# Patient Record
Sex: Male | Born: 1955
Health system: Southern US, Community
[De-identification: ages and names within clinical notes are randomized; demographics above are authoritative.]

## PROBLEM LIST (undated history)

## (undated) DIAGNOSIS — I8390 Asymptomatic varicose veins of unspecified lower extremity: Secondary | ICD-10-CM

## (undated) DIAGNOSIS — I10 Essential (primary) hypertension: Secondary | ICD-10-CM

## (undated) DIAGNOSIS — E785 Hyperlipidemia, unspecified: Secondary | ICD-10-CM

## (undated) DIAGNOSIS — K219 Gastro-esophageal reflux disease without esophagitis: Secondary | ICD-10-CM

## (undated) HISTORY — DX: Hyperlipidemia, unspecified: E78.5

## (undated) HISTORY — PX: MANDIBLE SURGERY: SHX707

## (undated) HISTORY — PX: HERNIA REPAIR: SHX51

---

## 1995-12-22 HISTORY — PX: ESOPHAGOGASTRIC FUNDOPLICATION: SHX405

## 2007-11-25 ENCOUNTER — Emergency Department (HOSPITAL_COMMUNITY): Admission: EM | Admit: 2007-11-25 | Discharge: 2007-11-25 | Payer: Self-pay | Admitting: Family Medicine

## 2008-12-21 HISTORY — PX: COLONOSCOPY: SHX174

## 2009-01-30 ENCOUNTER — Emergency Department (HOSPITAL_COMMUNITY): Admission: EM | Admit: 2009-01-30 | Discharge: 2009-01-30 | Payer: Self-pay | Admitting: Family Medicine

## 2009-09-25 ENCOUNTER — Ambulatory Visit: Payer: Self-pay | Admitting: Internal Medicine

## 2009-10-10 ENCOUNTER — Ambulatory Visit: Payer: Self-pay | Admitting: Internal Medicine

## 2010-10-24 ENCOUNTER — Emergency Department (HOSPITAL_COMMUNITY): Admission: EM | Admit: 2010-10-24 | Discharge: 2010-10-24 | Payer: Self-pay | Admitting: Emergency Medicine

## 2011-02-11 ENCOUNTER — Other Ambulatory Visit (HOSPITAL_COMMUNITY): Payer: Self-pay | Admitting: Specialist

## 2011-02-15 ENCOUNTER — Other Ambulatory Visit (HOSPITAL_COMMUNITY): Payer: Self-pay

## 2011-02-16 ENCOUNTER — Other Ambulatory Visit (HOSPITAL_COMMUNITY): Payer: Self-pay | Admitting: Specialist

## 2011-02-17 ENCOUNTER — Ambulatory Visit (HOSPITAL_COMMUNITY)
Admission: RE | Admit: 2011-02-17 | Discharge: 2011-02-17 | Disposition: A | Discharge status: Home / Self Care | Payer: 59 | Source: Ambulatory Visit | Attending: Specialist | Admitting: Specialist

## 2011-02-17 DIAGNOSIS — M79609 Pain in unspecified limb: Secondary | ICD-10-CM | POA: Insufficient documentation

## 2011-02-17 MED ORDER — GADOBENATE DIMEGLUMINE 529 MG/ML IV SOLN
20.0000 mL | Freq: Once | INTRAVENOUS | Status: AC | PRN
  Administered 2011-02-17: 20 mL via INTRAVENOUS

## 2011-03-03 ENCOUNTER — Ambulatory Visit: Payer: 59 | Attending: Specialist | Admitting: Physical Therapy

## 2011-03-03 DIAGNOSIS — M25529 Pain in unspecified elbow: Secondary | ICD-10-CM | POA: Insufficient documentation

## 2011-03-03 DIAGNOSIS — IMO0001 Reserved for inherently not codable concepts without codable children: Secondary | ICD-10-CM | POA: Insufficient documentation

## 2011-03-09 ENCOUNTER — Ambulatory Visit: Payer: 59 | Admitting: Physical Therapy

## 2011-03-11 ENCOUNTER — Ambulatory Visit: Payer: 59 | Admitting: Physical Therapy

## 2011-03-16 ENCOUNTER — Ambulatory Visit: Payer: 59 | Admitting: Physical Therapy

## 2011-03-18 ENCOUNTER — Ambulatory Visit: Payer: 59 | Admitting: Physical Therapy

## 2011-03-26 ENCOUNTER — Ambulatory Visit: Payer: 59 | Attending: Specialist | Admitting: Physical Therapy

## 2011-03-26 DIAGNOSIS — M25529 Pain in unspecified elbow: Secondary | ICD-10-CM | POA: Insufficient documentation

## 2011-03-26 DIAGNOSIS — IMO0001 Reserved for inherently not codable concepts without codable children: Secondary | ICD-10-CM | POA: Insufficient documentation

## 2011-03-31 ENCOUNTER — Encounter: Payer: Commercial Managed Care - PPO | Admitting: Physical Therapy

## 2011-04-02 ENCOUNTER — Ambulatory Visit: Payer: 59 | Admitting: Physical Therapy

## 2011-04-03 ENCOUNTER — Ambulatory Visit: Payer: 59 | Admitting: Physical Therapy

## 2011-04-20 ENCOUNTER — Ambulatory Visit: Payer: 59 | Admitting: Physical Therapy

## 2011-04-22 ENCOUNTER — Ambulatory Visit: Payer: 59 | Attending: Specialist | Admitting: Physical Therapy

## 2011-04-22 DIAGNOSIS — IMO0001 Reserved for inherently not codable concepts without codable children: Secondary | ICD-10-CM | POA: Insufficient documentation

## 2011-04-22 DIAGNOSIS — M25529 Pain in unspecified elbow: Secondary | ICD-10-CM | POA: Insufficient documentation

## 2011-04-29 ENCOUNTER — Encounter: Payer: Commercial Managed Care - PPO | Admitting: Physical Therapy

## 2011-04-30 ENCOUNTER — Encounter: Payer: Commercial Managed Care - PPO | Admitting: Physical Therapy

## 2011-10-21 ENCOUNTER — Inpatient Hospital Stay (INDEPENDENT_AMBULATORY_CARE_PROVIDER_SITE_OTHER)
Admission: RE | Admit: 2011-10-21 | Discharge: 2011-10-21 | Disposition: A | Discharge status: Home / Self Care | Payer: 59 | Source: Ambulatory Visit | Attending: Family Medicine | Admitting: Family Medicine

## 2011-10-21 DIAGNOSIS — J40 Bronchitis, not specified as acute or chronic: Secondary | ICD-10-CM

## 2011-11-08 ENCOUNTER — Emergency Department
Admission: EM | Admit: 2011-11-08 | Discharge: 2011-11-08 | Disposition: A | Discharge status: Home / Self Care | Payer: 59 | Source: Home / Self Care | Attending: Emergency Medicine | Admitting: Emergency Medicine

## 2011-11-08 DIAGNOSIS — J069 Acute upper respiratory infection, unspecified: Secondary | ICD-10-CM

## 2011-11-08 DIAGNOSIS — H6690 Otitis media, unspecified, unspecified ear: Secondary | ICD-10-CM

## 2011-11-08 DIAGNOSIS — H6692 Otitis media, unspecified, left ear: Secondary | ICD-10-CM

## 2011-11-08 DIAGNOSIS — R059 Cough, unspecified: Secondary | ICD-10-CM

## 2011-11-08 DIAGNOSIS — R05 Cough: Secondary | ICD-10-CM

## 2011-11-08 MED ORDER — PREDNISONE 10 MG PO TABS: 10.0000 mg | ORAL_TABLET | Freq: Two times a day (BID) | ORAL | Status: AC

## 2011-11-08 MED ORDER — AMOXICILLIN-POT CLAVULANATE 875-125 MG PO TABS: 1.0000 | ORAL_TABLET | Freq: Two times a day (BID) | ORAL | Status: AC

## 2011-11-08 NOTE — ED Provider Notes (Signed)
History     CSN: [id number] Arrival date & time: No admission date for patient encounter.   First MD Initiated Contact with Patient 11/08/11 1455      No chief complaint on file.   (Consider location/radiation/quality/duration/timing/severity/associated sxs/prior treatment) HPI Callen is a 55 y.o. male who complains of onset of cold symptoms for a few weeks.  He was seen at the [facility] urgent care about 3 weeks ago and was diagnosed with bronchitis, where he received a Z-Pak, cough medicine, and prednisone.  With this medicine he felt like it a lot better.  However, he is still have a lingering cough and some congestion.  It is worse when he walks out in the cold air.  No h/o asthma. No sore throat + cough No pleuritic pain No wheezing + nasal congestion + post-nasal drainage No sinus pain/pressure No chest congestion No itchy/red eyes No earache No hemoptysis No SOB No chills/sweats No fever No nausea No vomiting No abdominal pain No diarrhea No skin rashes No fatigue No myalgias No headache     No past medical history on file.  No past surgical history on file.  No family history on file.  History  Substance Use Topics  . Smoking status: Not on file  . Smokeless tobacco: Not on file  . Alcohol Use: Not on file      Review of Systems  Allergies  Review of patient's allergies indicates not on file.  Home Medications  No current outpatient prescriptions on file.  There were no vitals taken for this visit.  Physical Exam  Nursing note and vitals reviewed. Constitutional: He is oriented to person, place, and time. He appears well-developed and well-nourished.  HENT:  Head: Normocephalic and atraumatic.  Right Ear: Tympanic membrane, external ear and ear canal normal.  Left Ear: External ear and ear canal normal. Tympanic membrane is injected and erythematous.  Nose: Mucosal edema and rhinorrhea present.  Mouth/Throat: Posterior  oropharyngeal erythema present. No oropharyngeal exudate or posterior oropharyngeal edema.  Neck: Neck supple.  Cardiovascular: Regular rhythm and normal heart sounds.   Pulmonary/Chest: Effort normal and breath sounds normal. No respiratory distress.  Neurological: He is alert and oriented to person, place, and time.  Skin: Skin is warm and dry.  Psychiatric: He has a normal mood and affect. His speech is normal.    ED Course  Procedures (including critical care time)  Labs Reviewed - No data to display No results found.   No diagnosis found.    MDM   1)  Take the prescribed antibiotic as instructed. 2)  Use nasal saline solution (over the counter) at least 3 times a day. 3)  Use over the counter decongestants like Zyrtec-D every 12 hours as needed to help with congestion.  If you have hypertension, do not take medicines with sudafed.  4)  Can take tylenol every 6 hours or motrin every 8 hours for pain or fever. 5)  Follow up with your primary doctor if no improvement in 5-7 days, sooner if increasing pain, fever, or new symptoms.       Lily Kocher, MD 11/08/11 1538

## 2012-11-20 ENCOUNTER — Encounter (HOSPITAL_COMMUNITY): Payer: Self-pay | Admitting: *Deleted

## 2012-11-20 ENCOUNTER — Emergency Department (HOSPITAL_COMMUNITY)
Admission: EM | Admit: 2012-11-20 | Discharge: 2012-11-20 | Disposition: A | Discharge status: Home / Self Care | Payer: 59 | Source: Home / Self Care | Attending: Emergency Medicine | Admitting: Emergency Medicine

## 2012-11-20 DIAGNOSIS — I83009 Varicose veins of unspecified lower extremity with ulcer of unspecified site: Secondary | ICD-10-CM

## 2012-11-20 DIAGNOSIS — L97909 Non-pressure chronic ulcer of unspecified part of unspecified lower leg with unspecified severity: Secondary | ICD-10-CM

## 2012-11-20 HISTORY — DX: Gastro-esophageal reflux disease without esophagitis: K21.9

## 2012-11-20 HISTORY — DX: Asymptomatic varicose veins of unspecified lower extremity: I83.90

## 2012-11-20 MED ORDER — WHITE PETROLATUM EX GEL: CUTANEOUS | Status: DC | PRN

## 2012-11-20 MED ORDER — TRIAMCINOLONE ACETONIDE 0.1 % EX CREA: TOPICAL_CREAM | Freq: Two times a day (BID) | CUTANEOUS | Status: DC

## 2012-11-20 NOTE — ED Provider Notes (Signed)
History     CSN: [id number]  Arrival date & time 11/20/12  6644   First MD Initiated Contact with Patient 11/20/12 1047      Chief Complaint  Patient presents with  . Sore    (Consider location/radiation/quality/duration/timing/severity/associated sxs/prior treatment) HPI Comments: Patient presents urgent care describing a long history of varicose veins and a new lead develop ulcerated area to the inner aspect of his right ankle for about 3 weeks. Patient has been applying Gold Bond and cortisone to the area but is not helping. He wears daily tennis shoes. Patient denies any recent injury, fevers or chills.  The history is provided by the patient.    Past Medical History  Diagnosis Date  . GERD (gastroesophageal reflux disease)   . Varicose veins     History reviewed. No pertinent past surgical history.  Family History  Problem Relation Age of Onset  . Diabetes Mother   . Diabetes Father     History  Substance Use Topics  . Smoking status: Never Smoker   . Smokeless tobacco: Not on file  . Alcohol Use: No      Review of Systems  Constitutional: Negative for chills, activity change and appetite change.  Skin: Positive for color change and wound. Negative for rash.    Allergies  Review of patient's allergies indicates no known allergies.  Home Medications   Current Outpatient Rx  Name  Route  Sig  Dispense  Refill  . ESOMEPRAZOLE MAGNESIUM 20 MG PO CPDR   Oral   Take 20 mg by mouth daily before breakfast.           . OMEGA-3 FATTY ACIDS 1000 MG PO CAPS   Oral   Take 2 g by mouth daily.           . TRIAMCINOLONE ACETONIDE 0.1 % EX CREA   Topical   Apply topically 2 (two) times daily.   30 g   0   . WHITE PETROLATUM EX GEL   Topical   Apply topically as needed for dry skin.   30 g   0     BP 140/89  Pulse 95  Temp 98.5 F (36.9 C) (Oral)  Resp 18  SpO2 100%  Physical Exam  Nursing note and vitals reviewed. Constitutional: Vital  signs are normal. He appears well-developed and well-nourished. He appears toxic.  Musculoskeletal: He exhibits tenderness.       Feet:  Neurological: He is alert.  Skin: Rash noted. There is erythema.    ED Course  Procedures (including critical care time)  Labs Reviewed - No data to display No results found.   1. Venous stasis ulcer       MDM  Chronic changes- with peripheral varices and flank bases. Patient with a stage I ulcerative/ excoriation of in her aspect of right ankle. Most likely secondary to chronic venostasis early stages. Patient was prescribed a steroid cream along with petrolatum ointment and occlusive dressings. Instructed and encouraged to followup with the [facility]. We also discussed proper shoe wear to prevent friction and rubbing.        Jimmie Molly, MD 11/20/12 [phone]

## 2012-11-20 NOTE — ED Notes (Signed)
Pt  Has  History  Of  Varicose  Veins  He  Has  An  Ulcerated  Area   To  r lower  Leg      X  3  Weeks   - he  Reports  He  Has  Had        Problems  With the  r  Leg   For  Years     The  r lower  Leg  Is   Swollen  Somewhat      He  denys  Any  specefic  Injury  He  Is   Alert  And  Pleasant     He  Has  Been  Applying   Gold  Bond  And   Cortisone

## 2013-02-17 ENCOUNTER — Encounter (HOSPITAL_BASED_OUTPATIENT_CLINIC_OR_DEPARTMENT_OTHER): Payer: 59 | Attending: General Surgery

## 2013-02-17 DIAGNOSIS — L97809 Non-pressure chronic ulcer of other part of unspecified lower leg with unspecified severity: Secondary | ICD-10-CM | POA: Insufficient documentation

## 2013-02-17 DIAGNOSIS — K219 Gastro-esophageal reflux disease without esophagitis: Secondary | ICD-10-CM | POA: Insufficient documentation

## 2013-02-17 DIAGNOSIS — I872 Venous insufficiency (chronic) (peripheral): Secondary | ICD-10-CM | POA: Insufficient documentation

## 2013-02-18 NOTE — Progress Notes (Signed)
[facility]  NAME:  CARDIN, NITSCHKE NO.:  [id number]  MEDICAL RECORD NO.:  [id number]      DATE OF BIRTH:  07-Feb-1956  PHYSICIAN:  Ardath Sax, M.D.           VISIT DATE:                                  OFFICE VISIT   This is a 57 year old man, who is an employee in the engineering department of [employer].  He has a long history of venous hypertension and he has had a recurrent ulcer on the medial aspect of his right lower leg.  He has had Unna boots use on this before which have helped.  Today, I looked at the wound and he has typical venous changes of chronic venostasis and the ulcer is about 2 x 2 cm in diameter.  I debrided it with a curette and then we put on Santyl and covered it with silver alginate and put him in an [employer].  His past history really shows him to be a healthy man.  He does not have any history of anything but varicose veins and gastroesophageal reflux.  He only takes vitamins as his only medicine.  We found him to have a blood pressure of 140/82, pulse of 68, temperature 98, and respirations 18. We will treat him with the Unna boots and the silver alginate and Santyl and see him back in a week.  I suspect that this will probably take about a month to heal with the Unna boots.     Ardath Sax, M.D.     PP/MEDQ  D:  02/17/2013  T:  02/18/2013  Job:  [id number]

## 2013-02-24 ENCOUNTER — Encounter (HOSPITAL_BASED_OUTPATIENT_CLINIC_OR_DEPARTMENT_OTHER): Payer: 59 | Attending: General Surgery

## 2013-02-24 DIAGNOSIS — L97909 Non-pressure chronic ulcer of unspecified part of unspecified lower leg with unspecified severity: Secondary | ICD-10-CM | POA: Insufficient documentation

## 2013-02-24 DIAGNOSIS — I87319 Chronic venous hypertension (idiopathic) with ulcer of unspecified lower extremity: Secondary | ICD-10-CM | POA: Insufficient documentation

## 2013-03-13 ENCOUNTER — Ambulatory Visit (INDEPENDENT_AMBULATORY_CARE_PROVIDER_SITE_OTHER): Payer: 59 | Admitting: Family Medicine

## 2013-03-13 ENCOUNTER — Encounter: Payer: Self-pay | Admitting: Family Medicine

## 2013-03-13 VITALS — BP 126/81 | HR 66 | Temp 98.1°F | Ht 72.0 in | Wt 237.0 lb

## 2013-03-13 DIAGNOSIS — I83019 Varicose veins of right lower extremity with ulcer of unspecified site: Secondary | ICD-10-CM

## 2013-03-13 DIAGNOSIS — L97909 Non-pressure chronic ulcer of unspecified part of unspecified lower leg with unspecified severity: Secondary | ICD-10-CM | POA: Insufficient documentation

## 2013-03-13 DIAGNOSIS — E785 Hyperlipidemia, unspecified: Secondary | ICD-10-CM

## 2013-03-13 DIAGNOSIS — K219 Gastro-esophageal reflux disease without esophagitis: Secondary | ICD-10-CM

## 2013-03-13 DIAGNOSIS — Z833 Family history of diabetes mellitus: Secondary | ICD-10-CM

## 2013-03-13 DIAGNOSIS — I83009 Varicose veins of unspecified lower extremity with ulcer of unspecified site: Secondary | ICD-10-CM

## 2013-03-13 LAB — COMPREHENSIVE METABOLIC PANEL
ALT: 41 U/L (ref 0–53)
AST: 31 U/L (ref 0–37)
Albumin: 4.4 g/dL (ref 3.5–5.2)
Alkaline Phosphatase: 80 U/L (ref 39–117)
Glucose, Bld: 84 mg/dL (ref 70–99)
Potassium: 4.4 mEq/L (ref 3.5–5.3)
Sodium: 142 mEq/L (ref 135–145)
Total Protein: 7.2 g/dL (ref 6.0–8.3)

## 2013-03-13 LAB — CBC
MCH: 28.7 pg (ref 26.0–34.0)
MCHC: 34 g/dL (ref 30.0–36.0)
Platelets: 177 10*3/uL (ref 150–400)
RDW: 14 % (ref 11.5–15.5)

## 2013-03-13 LAB — LIPID PANEL: Cholesterol: 123 mg/dL (ref 0–200)

## 2013-03-13 NOTE — Progress Notes (Signed)
Subjective:    [name] [name] is a 57 y.o. male who presents to [facility] today for a new patient visit:  [name] [name] is a very pleasant 57 year old male who works here at [employer] hospital in [employer] and who presents 2 care to establish a new primary care relationship. He only has past medical history significant for GERD for which she is status post fundoplication in the late 1990s, some shoulder pain which resolved after he received an MRI and found nothing wrong, and consistently low HDL found on laboratory results. Patient states that he elected to show to lower his triglycerides and increase his HDL.  He was previously being seen by a practice in [facility] which is having him come in every 2 months to check a host of labs including C. meds, HIV, RPR, PSA, fasting lipid profile. He also had an EKG performed at each of these visits and these have all been within normal limits.  He is a never smoker. He is married to his wife of over 25 years. They have one son who is currently on a scholarship for dancing at the [employer].  His one chronic issue is ongoing right chronic wound ulcer secondary to venous stasis changes secondary to varicose vein ligation several years ago. He has no history of DVT and has never been on any blood thinners. He is seen at wound care for his wound ulcer.  The patient denies fever, unusual weight change, decreased hearing, chest pain, palpitations, pre-syncopal or syncopal episodes, dyspnea on exertion, prolonged cough, hemoptysis, change in bowel habits, melena, hematochezia, severe indigestion/heartburn, nausea/vomiting/abdominal pain, genital sores, muscle weakness, difficulty walking, abnormal bleeding, or enlarged lymph nodes.     The following portions of the patient's history were reviewed and updated as appropriate: allergies, current medications, past medical history, family and social history, and problem list. Patient is a nonsmoker.    PMH  reviewed.  Past Medical History  Diagnosis Date  . GERD (gastroesophageal reflux disease)   . Varicose veins    Past Surgical History  Procedure Laterality Date  . Esophagogastric fundoplication  1997    Medications reviewed. Current Outpatient Prescriptions  Medication Sig Dispense Refill  . esomeprazole (NEXIUM) 20 MG capsule Take 20 mg by mouth daily before breakfast.        . fish oil-omega-3 fatty acids 1000 MG capsule Take 2 g by mouth daily.        Marland Kitchen triamcinolone cream (KENALOG) 0.1 % Apply topically 2 (two) times daily.  30 g  0  . white petrolatum ointment Apply topically as needed for dry skin.  30 g  0   No current facility-administered medications for this visit.      Objective:   Physical Exam BP 126/81  Pulse 66  Temp(Src) 98.1 F (36.7 C) (Oral)  Ht 6' (1.829 m)  Wt 237 lb (107.502 kg)  BMI 32.14 kg/m2 Gen:  Alert, cooperative patient who appears stated age in no acute distress.  Vital signs reviewed.  Obese, pleasant, conversant HEENT:  [place]/AT.  EOMI, PERRL.  MMM, tonsils non-erythematous, non-edematous.  External ears WNL, Bilateral TM's normal without retraction, redness or bulging.  Neck:  Supple, no LAD Cardiac:  Regular rate and rhythm without murmur auscultated.  Good S1/S2. Pulm:  Clear to auscultation bilaterally with good air movement.  No wheezes or rales noted.   Abd:  Soft/nondistended/nontender.  Good bowel sounds throughout all four quadrants.  No masses noted.  Exts: Non edematous BL.  Left LE WNL.  Right LE with Unna boot in place.  Neuro:  Alert and oriented to person, place, and date.  CN II-XII intact.  No focal deficits noted.   Psych:  Not depressed or anxious appearing. Linear and coherent thought process.  No results found for this or any previous visit (from the past 72 hour(s)).

## 2013-03-13 NOTE — Assessment & Plan Note (Signed)
Continue PPI.   Received letter from insurance saying that he would need to change his Nexium to another PPI and they presented a list. He does not have this with him today. He is to call back when his refill is about to run out also that I can call in the new prescription for him

## 2013-03-13 NOTE — Patient Instructions (Addendum)
We'll check some labs today.  Remember to sign up for Mychart if you get the chance.  It was good to meet you today!  Health Maintenance, Males A healthy lifestyle and preventative care can promote health and wellness.  Maintain regular health, dental, and eye exams.  Eat a healthy diet. Foods like vegetables, fruits, whole grains, low-fat dairy products, and lean protein foods contain the nutrients you need without too many calories. Decrease your intake of foods high in solid fats, added sugars, and salt. Get information about a proper diet from your caregiver, if necessary.  Regular physical exercise is one of the most important things you can do for your health. Most adults should get at least 150 minutes of moderate-intensity exercise (any activity that increases your heart rate and causes you to sweat) each week. In addition, most adults need muscle-strengthening exercises on 2 or more days a week.   Maintain a healthy weight. The body mass index (BMI) is a screening tool to identify possible weight problems. It provides an estimate of body fat based on height and weight. Your caregiver can help determine your BMI, and can help you achieve or maintain a healthy weight. For adults 20 years and older:  A BMI below 18.5 is considered underweight.  A BMI of 18.5 to 24.9 is normal.  A BMI of 25 to 29.9 is considered overweight.  A BMI of 30 and above is considered obese.  Maintain normal blood lipids and cholesterol by exercising and minimizing your intake of saturated fat. Eat a balanced diet with plenty of fruits and vegetables. Blood tests for lipids and cholesterol should begin at age 25 and be repeated every 5 years. If your lipid or cholesterol levels are high, you are over 50, or you are a high risk for heart disease, you may need your cholesterol levels checked more frequently.Ongoing high lipid and cholesterol levels should be treated with medicines, if diet and exercise are not  effective.  If you smoke, find out from your caregiver how to quit. If you do not use tobacco, do not start.  If you choose to drink alcohol, do not exceed 2 drinks per day. One drink is considered to be 12 ounces (355 mL) of beer, 5 ounces (148 mL) of wine, or 1.5 ounces (44 mL) of liquor.  Avoid use of street drugs. Do not share needles with anyone. Ask for help if you need support or instructions about stopping the use of drugs.  High blood pressure causes heart disease and increases the risk of stroke. Blood pressure should be checked at least every 1 to 2 years. Ongoing high blood pressure should be treated with medicines if weight loss and exercise are not effective.  If you are 54 to 57 years old, ask your caregiver if you should take aspirin to prevent heart disease.  Diabetes screening involves taking a blood sample to check your fasting blood sugar level. This should be done once every 3 years, after age 68, if you are within normal weight and without risk factors for diabetes. Testing should be considered at a younger age or be carried out more frequently if you are overweight and have at least 1 risk factor for diabetes.  Colorectal cancer can be detected and often prevented. Most routine colorectal cancer screening begins at the age of 11 and continues through age 13. However, your caregiver may recommend screening at an earlier age if you have risk factors for colon cancer. On a yearly basis,  your caregiver may provide home test kits to check for hidden blood in the stool. Use of a small camera at the end of a tube, to directly examine the colon (sigmoidoscopy or colonoscopy), can detect the earliest forms of colorectal cancer. Talk to your caregiver about this at age 84, when routine screening begins. Direct examination of the colon should be repeated every 5 to 10 years through age 68, unless early forms of pre-cancerous polyps or small growths are found.  Hepatitis C blood testing is  recommended for all people born from [birth year] through 1965 and any individual with known risks for hepatitis C.  Healthy men should no longer receive prostate-specific antigen (PSA) blood tests as part of routine cancer screening. Consult with your caregiver about prostate cancer screening.  Testicular cancer screening is not recommended for adolescents or adult males who have no symptoms. Screening includes self-exam, caregiver exam, and other screening tests. Consult with your caregiver about any symptoms you have or any concerns you have about testicular cancer.  Practice safe sex. Use condoms and avoid high-risk sexual practices to reduce the spread of sexually transmitted infections (STIs).  Use sunscreen with a sun protection factor (SPF) of 30 or greater. Apply sunscreen liberally and repeatedly throughout the day. You should seek shade when your shadow is shorter than you. Protect yourself by wearing long sleeves, pants, a wide-brimmed hat, and sunglasses year round, whenever you are outdoors.  Notify your caregiver of new moles or changes in moles, especially if there is a change in shape or color. Also notify your caregiver if a mole is larger than the size of a pencil eraser.  A one-time screening for abdominal aortic aneurysm (AAA) and surgical repair of large AAAs by sound wave imaging (ultrasonography) is recommended for ages 78 to 72 years who are current or former smokers.  Stay current with your immunizations. Document Released: 06/04/2008 Document Revised: 02/29/2012 Document Reviewed: 05/04/2011 [facility] Patient Information 2013 [place], [place].

## 2013-03-13 NOTE — Assessment & Plan Note (Signed)
Followed by wound care with weekly Unna boot changes. This is healing nicely.

## 2013-03-13 NOTE — Assessment & Plan Note (Signed)
Checking fasting lipids today.

## 2013-03-15 ENCOUNTER — Encounter: Payer: Self-pay | Admitting: Family Medicine

## 2013-03-24 ENCOUNTER — Encounter (HOSPITAL_BASED_OUTPATIENT_CLINIC_OR_DEPARTMENT_OTHER): Payer: 59 | Attending: General Surgery

## 2013-03-24 DIAGNOSIS — I872 Venous insufficiency (chronic) (peripheral): Secondary | ICD-10-CM | POA: Insufficient documentation

## 2013-03-24 DIAGNOSIS — L97409 Non-pressure chronic ulcer of unspecified heel and midfoot with unspecified severity: Secondary | ICD-10-CM | POA: Insufficient documentation

## 2013-03-24 DIAGNOSIS — I87309 Chronic venous hypertension (idiopathic) without complications of unspecified lower extremity: Secondary | ICD-10-CM | POA: Insufficient documentation

## 2013-06-20 ENCOUNTER — Ambulatory Visit (INDEPENDENT_AMBULATORY_CARE_PROVIDER_SITE_OTHER): Payer: 59 | Admitting: Family Medicine

## 2013-06-20 VITALS — BP 129/89 | HR 85 | Temp 97.9°F | Resp 20 | Wt 238.0 lb

## 2013-06-20 DIAGNOSIS — J019 Acute sinusitis, unspecified: Secondary | ICD-10-CM

## 2013-06-20 DIAGNOSIS — K219 Gastro-esophageal reflux disease without esophagitis: Secondary | ICD-10-CM

## 2013-06-20 MED ORDER — AMOXICILLIN-POT CLAVULANATE 875-125 MG PO TABS: 1.0000 | ORAL_TABLET | Freq: Two times a day (BID) | ORAL | Status: DC

## 2013-06-20 MED ORDER — PANTOPRAZOLE SODIUM 40 MG PO TBEC: 40.0000 mg | DELAYED_RELEASE_TABLET | Freq: Every day | ORAL | Status: DC

## 2013-06-20 NOTE — Progress Notes (Signed)
Subjective:     Patient ID: [name] [name], male   DOB: Dec 13, 1956, 57 y.o.   MRN: [id number]  HPI 57 year old male presents for sinus pressure/congestion. Patient states that he has cold symptoms for 1 month.  It all began with persistent cough.  He then developed nasal congestion and purulent nasal discharge.  He has been using OTC mucinex to aid in symptom relief.  He denies recent fevers/chills but has had persistent symptoms.  No other associated symptoms: no sore throat, nausea/vomiting, SOB, chest discomfort.   Review of Systems Per HPI    Objective:   Physical Exam Filed Vitals:   06/20/13 1412  BP: 129/89  Pulse: 85  Temp: 97.9 F (36.6 C)  Resp: 20   Exam: General: well appearing, NAD. HEENT: NCAT. No pharyngeal erythema or exudate.  slighty erythematous nasal mucosa. Cardiovascular: RRR. No murmurs, rubs, or gallops. Respiratory: CTAB. No rales, rhonchi, or wheeze.    Assessment:     See prob. List.    Plan:

## 2013-06-20 NOTE — Assessment & Plan Note (Signed)
Given duration of symptoms will treat with empiric Augmentin x 7 days.

## 2013-06-20 NOTE — Assessment & Plan Note (Signed)
Patient reports that he would like to switch to Protonix (formulary/insurance issues).  Will send in to pharmacy.

## 2013-06-20 NOTE — Patient Instructions (Addendum)
It was nice seeing you today.  Please take the antibiotic as prescribed.  If you have any questions or concerns don't hesitate to call.  Sinusitis Sinusitis is redness, soreness, and swelling (inflammation) of the paranasal sinuses. Paranasal sinuses are air pockets within the bones of your face (beneath the eyes, the middle of the forehead, or above the eyes). In healthy paranasal sinuses, mucus is able to drain out, and air is able to circulate through them by way of your nose. However, when your paranasal sinuses are inflamed, mucus and air can become trapped. This can allow bacteria and other germs to grow and cause infection. Sinusitis can develop quickly and last only a short time (acute) or continue over a long period (chronic). Sinusitis that lasts for more than 12 weeks is considered chronic.  CAUSES  Causes of sinusitis include:  Allergies.  Structural abnormalities, such as displacement of the cartilage that separates your nostrils (deviated septum), which can decrease the air flow through your nose and sinuses and affect sinus drainage.  Functional abnormalities, such as when the small hairs (cilia) that line your sinuses and help remove mucus do not work properly or are not present. SYMPTOMS  Symptoms of acute and chronic sinusitis are the same. The primary symptoms are pain and pressure around the affected sinuses. Other symptoms include:  Upper toothache.  Earache.  Headache.  Bad breath.  Decreased sense of smell and taste.  A cough, which worsens when you are lying flat.  Fatigue.  Fever.  Thick drainage from your nose, which often is green and may contain pus (purulent).  Swelling and warmth over the affected sinuses. DIAGNOSIS  Your caregiver will perform a physical exam. During the exam, your caregiver may:  Look in your nose for signs of abnormal growths in your nostrils (nasal polyps).  Tap over the affected sinus to check for signs of  infection.  View the inside of your sinuses (endoscopy) with a special imaging device with a light attached (endoscope), which is inserted into your sinuses. If your caregiver suspects that you have chronic sinusitis, one or more of the following tests may be recommended:  Allergy tests.  Nasal culture A sample of mucus is taken from your nose and sent to a lab and screened for bacteria.  Nasal cytology A sample of mucus is taken from your nose and examined by your caregiver to determine if your sinusitis is related to an allergy. TREATMENT  Most cases of acute sinusitis are related to a viral infection and will resolve on their own within 10 days. Sometimes medicines are prescribed to help relieve symptoms (pain medicine, decongestants, nasal steroid sprays, or saline sprays).  However, for sinusitis related to a bacterial infection, your caregiver will prescribe antibiotic medicines. These are medicines that will help kill the bacteria causing the infection.  Rarely, sinusitis is caused by a fungal infection. In theses cases, your caregiver will prescribe antifungal medicine. For some cases of chronic sinusitis, surgery is needed. Generally, these are cases in which sinusitis recurs more than 3 times per year, despite other treatments. HOME CARE INSTRUCTIONS   Drink plenty of water. Water helps thin the mucus so your sinuses can drain more easily.  Use a humidifier.  Inhale steam 3 to 4 times a day (for example, sit in the bathroom with the shower running).  Apply a warm, moist washcloth to your face 3 to 4 times a day, or as directed by your caregiver.  Use saline nasal sprays to  help moisten and clean your sinuses.  Take over-the-counter or prescription medicines for pain, discomfort, or fever only as directed by your caregiver. SEEK IMMEDIATE MEDICAL CARE IF:  You have increasing pain or severe headaches.  You have nausea, vomiting, or drowsiness.  You have swelling around your  face.  You have vision problems.  You have a stiff neck.  You have difficulty breathing. MAKE SURE YOU:   Understand these instructions.  Will watch your condition.  Will get help right away if you are not doing well or get worse. Document Released: 12/07/2005 Document Revised: 02/29/2012 Document Reviewed: 12/22/2011 [facility] Patient Information 2014 [place], [place].

## 2013-06-20 NOTE — Progress Notes (Signed)
Pt reports possible sinus infection for greater than 2 weeks with no relief from over the counter meds. Wyatt Haste, RN-BSN

## 2013-09-07 ENCOUNTER — Telehealth: Payer: Self-pay | Admitting: Family Medicine

## 2013-09-07 NOTE — Telephone Encounter (Signed)
We did receive them

## 2013-09-07 NOTE — Telephone Encounter (Signed)
Patient left message on medical records line wondering if you ever received his medical records from [place].  He said that they have said they sent them twice.

## 2013-09-08 NOTE — Telephone Encounter (Signed)
Spoke with patient and informed him of below 

## 2013-10-26 ENCOUNTER — Other Ambulatory Visit: Payer: Self-pay

## 2013-11-01 ENCOUNTER — Encounter: Payer: Self-pay | Admitting: Family Medicine

## 2013-11-01 ENCOUNTER — Ambulatory Visit (INDEPENDENT_AMBULATORY_CARE_PROVIDER_SITE_OTHER): Payer: 59 | Admitting: Family Medicine

## 2013-11-01 VITALS — BP 152/98 | HR 92 | Temp 98.1°F | Wt 234.0 lb

## 2013-11-01 DIAGNOSIS — E669 Obesity, unspecified: Secondary | ICD-10-CM

## 2013-11-01 DIAGNOSIS — R058 Other specified cough: Secondary | ICD-10-CM

## 2013-11-01 DIAGNOSIS — R059 Cough, unspecified: Secondary | ICD-10-CM

## 2013-11-01 DIAGNOSIS — R05 Cough: Secondary | ICD-10-CM

## 2013-11-01 NOTE — Assessment & Plan Note (Signed)
This could be complicating the picture by worsening GERD. Patient should be counseled to lose weight.

## 2013-11-01 NOTE — Assessment & Plan Note (Signed)
Assessment: given short duration and the lack of infectious symptoms, the most likely cause is reflux in the affected his medication wearing off in the afternoon in leads to worsening dry cough; differential also includes allergic rhinitis and undiagnosed asthma Plan:  - Increase Protonix to 40 mg twice a day for 2 weeks to see if it improves

## 2013-11-01 NOTE — Progress Notes (Signed)
  Subjective:    Patient ID: [name] [name], male    DOB: 06-24-1956, 57 y.o.   MRN: [id number]  HPI  57 year old man with obesity and a history of GERD who presents with a 2 week history of nonproductive cough. The cough is worse the evenings when he gets off work and at night. It occurs while awake and not exclusively while lying down. He denies fever or chills the nurses in the morning he does have some congestion. He denies shortness of breath and chest pain. He does not have a similar history of nonproductive cough. He is currently taking his Protonix CT of the for GERD.  Review of Systems See history of present illness    Objective:   Physical Exam BP 152/98  Pulse 92  Temp(Src) 98.1 F (36.7 C) (Oral)  Wt 234 lb (106.142 kg) Body mass index is 31.73 kg/(m^2).  Gen.: obese white male, nondistressed nontender appearing HEENT: cephalic atraumatic, tympanic membranes clear flexor bilaterally without effusions, no submental or submandibular lymphadenopathy, no frontal or maxillary sinus tenderness, oropharynx clear and moist no exudates Lungs: normal work of breathing, clear to auscultation bilaterally, no dullness to percussion       Assessment & Plan:

## 2013-11-01 NOTE — Patient Instructions (Signed)
[name] [name],   It was great to meet you. I think that you are having reflux that is causing this coughing in the evening. Therefore, try taking two tablets a day for two weeks and return for follow up at this time.   Sincerely,   Dr. Clinton Sawyer

## 2014-01-18 ENCOUNTER — Telehealth: Payer: Self-pay | Admitting: Family Medicine

## 2014-01-18 NOTE — Telephone Encounter (Signed)
Pt called and needs a refill on his Pantoprazole. He made an appointment for 2/17. jw

## 2014-01-19 MED ORDER — PANTOPRAZOLE SODIUM 40 MG PO TBEC: 40.0000 mg | DELAYED_RELEASE_TABLET | Freq: Every day | ORAL | Status: DC

## 2014-02-06 ENCOUNTER — Encounter: Payer: 59 | Admitting: Family Medicine

## 2014-02-13 ENCOUNTER — Encounter: Payer: 59 | Admitting: Family Medicine

## 2014-03-06 ENCOUNTER — Ambulatory Visit (INDEPENDENT_AMBULATORY_CARE_PROVIDER_SITE_OTHER): Payer: 59 | Admitting: Family Medicine

## 2014-03-06 ENCOUNTER — Encounter: Payer: Self-pay | Admitting: Family Medicine

## 2014-03-06 VITALS — BP 129/87 | HR 68 | Ht 72.0 in | Wt 233.0 lb

## 2014-03-06 DIAGNOSIS — Z23 Encounter for immunization: Secondary | ICD-10-CM

## 2014-03-06 DIAGNOSIS — K219 Gastro-esophageal reflux disease without esophagitis: Secondary | ICD-10-CM

## 2014-03-06 DIAGNOSIS — Z0000 Encounter for general adult medical examination without abnormal findings: Secondary | ICD-10-CM

## 2014-03-06 DIAGNOSIS — E785 Hyperlipidemia, unspecified: Secondary | ICD-10-CM

## 2014-03-06 DIAGNOSIS — E669 Obesity, unspecified: Secondary | ICD-10-CM

## 2014-03-06 LAB — COMPREHENSIVE METABOLIC PANEL
ALBUMIN: 4.5 g/dL (ref 3.5–5.2)
ALK PHOS: 79 U/L (ref 39–117)
ALT: 43 U/L (ref 0–53)
AST: 28 U/L (ref 0–37)
BUN: 20 mg/dL (ref 6–23)
CO2: 30 mEq/L (ref 19–32)
Calcium: 9.2 mg/dL (ref 8.4–10.5)
Chloride: 105 mEq/L (ref 96–112)
Creat: 0.85 mg/dL (ref 0.50–1.35)
Glucose, Bld: 90 mg/dL (ref 70–99)
POTASSIUM: 4.4 meq/L (ref 3.5–5.3)
SODIUM: 140 meq/L (ref 135–145)
Total Bilirubin: 1.1 mg/dL (ref 0.2–1.2)
Total Protein: 7.4 g/dL (ref 6.0–8.3)

## 2014-03-06 LAB — LIPID PANEL
Cholesterol: 126 mg/dL (ref 0–200)
HDL: 28 mg/dL — ABNORMAL LOW
LDL Cholesterol: 65 mg/dL (ref 0–99)
Total CHOL/HDL Ratio: 4.5 ratio
Triglycerides: 167 mg/dL — ABNORMAL HIGH
VLDL: 33 mg/dL (ref 0–40)

## 2014-03-06 LAB — CBC
HEMATOCRIT: 45.7 % (ref 39.0–52.0)
Hemoglobin: 15.4 g/dL (ref 13.0–17.0)
MCH: 28.5 pg (ref 26.0–34.0)
MCHC: 33.7 g/dL (ref 30.0–36.0)
MCV: 84.5 fL (ref 78.0–100.0)
PLATELETS: 186 10*3/uL (ref 150–400)
RBC: 5.41 MIL/uL (ref 4.22–5.81)
RDW: 15.2 % (ref 11.5–15.5)
WBC: 5.7 10*3/uL (ref 4.0–10.5)

## 2014-03-06 NOTE — Patient Instructions (Addendum)
Think about Sept/October coming back for the lung testing.  We do that here.  For the skin tags, we can do that here or at a dermatologist.  Schedule a follow up if you want them frozen here.    Preventive Care for Adults, Male A healthy lifestyle and preventive care can promote health and wellness. Preventive health guidelines for men include the following key practices:  A routine yearly physical is a good way to check with your health care provider about your health and preventative screening. It is a chance to share any concerns and updates on your health and to receive a thorough exam.  Visit your dentist for a routine exam and preventative care every 6 months. Brush your teeth twice a day and floss once a day. Good oral hygiene prevents tooth decay and gum disease.  The frequency of eye exams is based on your age, health, family medical history, use of contact lenses, and other factors. Follow your health care provider's recommendations for frequency of eye exams.  Eat a healthy diet. Foods such as vegetables, fruits, whole grains, low-fat dairy products, and lean protein foods contain the nutrients you need without too many calories. Decrease your intake of foods high in solid fats, added sugars, and salt. Eat the right amount of calories for you.Get information about a proper diet from your health care provider, if necessary.  Regular physical exercise is one of the most important things you can do for your health. Most adults should get at least 150 minutes of moderate-intensity exercise (any activity that increases your heart rate and causes you to sweat) each week. In addition, most adults need muscle-strengthening exercises on 2 or more days a week.  Maintain a healthy weight. The body mass index (BMI) is a screening tool to identify possible weight problems. It provides an estimate of body fat based on height and weight. Your health care provider can find your BMI and can help you  achieve or maintain a healthy weight.For adults 20 years and older:  A BMI below 18.5 is considered underweight.  A BMI of 18.5 to 24.9 is normal.  A BMI of 25 to 29.9 is considered overweight.  A BMI of 30 and above is considered obese.  Maintain normal blood lipids and cholesterol levels by exercising and minimizing your intake of saturated fat. Eat a balanced diet with plenty of fruit and vegetables. Blood tests for lipids and cholesterol should begin at age 70 and be repeated every 5 years. If your lipid or cholesterol levels are high, you are over 50, or you are at high risk for heart disease, you may need your cholesterol levels checked more frequently.Ongoing high lipid and cholesterol levels should be treated with medicines if diet and exercise are not working.  If you smoke, find out from your health care provider how to quit. If you do not use tobacco, do not start.  Lung cancer screening is recommended for adults aged 61 80 years who are at high risk for developing lung cancer because of a history of smoking. A yearly low-dose CT scan of the lungs is recommended for people who have at least a 30-pack-year history of smoking and are a current smoker or have quit within the past 15 years. A pack year of smoking is smoking an average of 1 pack of cigarettes a day for 1 year (for example: 1 pack a day for 30 years or 2 packs a day for 15 years). Yearly screening should continue  until the smoker has stopped smoking for at least 15 years. Yearly screening should be stopped for people who develop a health problem that would prevent them from having lung cancer treatment.  If you choose to drink alcohol, do not have more than 2 drinks per day. One drink is considered to be 12 ounces (355 mL) of beer, 5 ounces (148 mL) of wine, or 1.5 ounces (44 mL) of liquor.  Avoid use of street drugs. Do not share needles with anyone. Ask for help if you need support or instructions about stopping the use of  drugs.  High blood pressure causes heart disease and increases the risk of stroke. Your blood pressure should be checked at least every 1 2 years. Ongoing high blood pressure should be treated with medicines, if weight loss and exercise are not effective.  If you are 4 58 years old, ask your health care provider if you should take aspirin to prevent heart disease.  Diabetes screening involves taking a blood sample to check your fasting blood sugar level. This should be done once every 3 years, after age 80, if you are within normal weight and without risk factors for diabetes. Testing should be considered at a younger age or be carried out more frequently if you are overweight and have at least 1 risk factor for diabetes.  Colorectal cancer can be detected and often prevented. Most routine colorectal cancer screening begins at the age of 12 and continues through age 52. However, your health care provider may recommend screening at an earlier age if you have risk factors for colon cancer. On a yearly basis, your health care provider may provide home test kits to check for hidden blood in the stool. Use of a small camera at the end of a tube to directly examine the colon (sigmoidoscopy or colonoscopy) can detect the earliest forms of colorectal cancer. Talk to your health care provider about this at age 66, when routine screening begins. Direct exam of the colon should be repeated every 5 10 years through age 13, unless early forms of precancerous polyps or small growths are found.  People who are at an increased risk for hepatitis B should be screened for this virus. You are considered at high risk for hepatitis B if:  You were born in a country where hepatitis B occurs often. Talk with your health care provider about which countries are considered high-risk.  Your parents were born in a high-risk country and you have not received a shot to protect against hepatitis B (hepatitis B vaccine).  You have  HIV or AIDS.  You use needles to inject street drugs.  You live with, or have sex with, someone who has hepatitis B.  You are a man who has sex with other men (MSM).  You get hemodialysis treatment.  You take certain medicines for conditions such as cancer, organ transplantation, and autoimmune conditions.  Hepatitis C blood testing is recommended for all people born from [birth year] through 1965 and any individual with known risks for hepatitis C.  Practice safe sex. Use condoms and avoid high-risk sexual practices to reduce the spread of sexually transmitted infections (STIs). STIs include gonorrhea, chlamydia, syphilis, trichomonas, herpes, HPV, and human immunodeficiency virus (HIV). Herpes, HIV, and HPV are viral illnesses that have no cure. They can result in disability, cancer, and death.  A one-time screening for abdominal aortic aneurysm (AAA) and surgical repair of large AAAs by ultrasound are recommended for men ages 34 to 48  years who are current or former smokers.  Healthy men should no longer receive prostate-specific antigen (PSA) blood tests as part of routine cancer screening. Talk with your health care provider about prostate cancer screening.  Testicular cancer screening is not recommended for adult males who have no symptoms. Screening includes self-exam, a health care provider exam, and other screening tests. Consult with your health care provider about any symptoms you have or any concerns you have about testicular cancer.  Use sunscreen. Apply sunscreen liberally and repeatedly throughout the day. You should seek shade when your shadow is shorter than you. Protect yourself by wearing long sleeves, pants, a wide-brimmed hat, and sunglasses year round, whenever you are outdoors.  Once a month, do a whole-body skin exam, using a mirror to look at the skin on your back. Tell your health care provider about new moles, moles that have irregular borders, moles that are larger than a  pencil eraser, or moles that have changed in shape or color.  Stay current with required vaccines (immunizations).  Influenza vaccine. All adults should be immunized every year.  Tetanus, diphtheria, and acellular pertussis (Td, Tdap) vaccine. An adult who has not previously received Tdap or who does not know his vaccine status should receive 1 dose of Tdap. This initial dose should be followed by tetanus and diphtheria toxoids (Td) booster doses every 10 years. Adults with an unknown or incomplete history of completing a 3-dose immunization series with Td-containing vaccines should begin or complete a primary immunization series including a Tdap dose. Adults should receive a Td booster every 10 years.  Varicella vaccine. An adult without evidence of immunity to varicella should receive 2 doses or a second dose if he has previously received 1 dose.  Human papillomavirus (HPV) vaccine. Males aged 54 21 years who have not received the vaccine previously should receive the 3-dose series. Males aged 67 26 years may be immunized. Immunization is recommended through the age of 37 years for any male who has sex with males and did not get any or all doses earlier. Immunization is recommended for any person with an immunocompromised condition through the age of 35 years if he did not get any or all doses earlier. During the 3-dose series, the second dose should be obtained 4 8 weeks after the first dose. The third dose should be obtained 24 weeks after the first dose and 16 weeks after the second dose.  Zoster vaccine. One dose is recommended for adults aged 18 years or older unless certain conditions are present.  Measles, mumps, and rubella (MMR) vaccine. Adults born before [birth year] generally are considered immune to measles and mumps. Adults born in [birth year] or later should have 1 or more doses of MMR vaccine unless there is a contraindication to the vaccine or there is laboratory evidence of immunity to each of the  three diseases. A routine second dose of MMR vaccine should be obtained at least 28 days after the first dose for students attending postsecondary schools, health care workers, or international travelers. People who received inactivated measles vaccine or an unknown type of measles vaccine during 1963 1967 should receive 2 doses of MMR vaccine. People who received inactivated mumps vaccine or an unknown type of mumps vaccine before 1979 and are at high risk for mumps infection should consider immunization with 2 doses of MMR vaccine. Unvaccinated health care workers born before [birth year] who lack laboratory evidence of measles, mumps, or rubella immunity or laboratory confirmation of disease should consider  measles and mumps immunization with 2 doses of MMR vaccine or rubella immunization with 1 dose of MMR vaccine.  Pneumococcal 13-valent conjugate (PCV13) vaccine. When indicated, a person who is uncertain of his immunization history and has no record of immunization should receive the PCV13 vaccine. An adult aged 5 years or older who has certain medical conditions and has not been previously immunized should receive 1 dose of PCV13 vaccine. This PCV13 should be followed with a dose of pneumococcal polysaccharide (PPSV23) vaccine. The PPSV23 vaccine dose should be obtained at least 8 weeks after the dose of PCV13 vaccine. An adult aged 55 years or older who has certain medical conditions and previously received 1 or more doses of PPSV23 vaccine should receive 1 dose of PCV13. The PCV13 vaccine dose should be obtained 1 or more years after the last PPSV23 vaccine dose.  Pneumococcal polysaccharide (PPSV23) vaccine. When PCV13 is also indicated, PCV13 should be obtained first. All adults aged 6 years and older should be immunized. An adult younger than age 8 years who has certain medical conditions should be immunized. Any person who resides in a nursing home or long-term care facility should be immunized. An adult  smoker should be immunized. People with an immunocompromised condition and certain other conditions should receive both PCV13 and PPSV23 vaccines. People with human immunodeficiency virus (HIV) infection should be immunized as soon as possible after diagnosis. Immunization during chemotherapy or radiation therapy should be avoided. Routine use of PPSV23 vaccine is not recommended for American Indians, [place] Natives, or people younger than 65 years unless there are medical conditions that require PPSV23 vaccine. When indicated, people who have unknown immunization and have no record of immunization should receive PPSV23 vaccine. One-time revaccination 5 years after the first dose of PPSV23 is recommended for people aged 70 64 years who have chronic kidney failure, nephrotic syndrome, asplenia, or immunocompromised conditions. People who received 1 2 doses of PPSV23 before age 12 years should receive another dose of PPSV23 vaccine at age 36 years or later if at least 5 years have passed since the previous dose. Doses of PPSV23 are not needed for people immunized with PPSV23 at or after age 63 years.  Meningococcal vaccine. Adults with asplenia or persistent complement component deficiencies should receive 2 doses of quadrivalent meningococcal conjugate (MenACWY-D) vaccine. The doses should be obtained at least 2 months apart. Microbiologists working with certain meningococcal bacteria, [employer] recruits, people at risk during an outbreak, and people who travel to or live in countries with a high rate of meningitis should be immunized. A first-year college student up through age 54 years who is living in a residence hall should receive a dose if he did not receive a dose on or after his 16th birthday. Adults who have certain high-risk conditions should receive one or more doses of vaccine.  Hepatitis A vaccine. Adults who wish to be protected from this disease, have certain high-risk conditions, work with  hepatitis A-infected animals, work in hepatitis A research labs, or travel to or work in countries with a high rate of hepatitis A should be immunized. Adults who were previously unvaccinated and who anticipate close contact with an international adoptee during the first 60 days after arrival in the [place] from a country with a high rate of hepatitis A should be immunized.  Hepatitis B vaccine. Adults who wish to be protected from this disease, have certain high-risk conditions, may be exposed to blood or other infectious body fluids, are household  contacts or sex partners of hepatitis B positive people, are clients or workers in certain care facilities, or travel to or work in countries with a high rate of hepatitis B should be immunized.  Haemophilus influenzae type b (Hib) vaccine. A previously unvaccinated person with asplenia or sickle cell disease or having a scheduled splenectomy should receive 1 dose of Hib vaccine. Regardless of previous immunization, a recipient of a hematopoietic stem cell transplant should receive a 3-dose series 6 12 months after his successful transplant. Hib vaccine is not recommended for adults with HIV infection. Preventive Service / Frequency Ages 25 to 38  Blood pressure check.** / Every 1 to 2 years.  Lipid and cholesterol check.** / Every 5 years beginning at age 28.  Hepatitis C blood test.** / For any individual with known risks for hepatitis C.  Skin self-exam. / Monthly.  Influenza vaccine. / Every year.  Tetanus, diphtheria, and acellular pertussis (Tdap, Td) vaccine.** / Consult your health care provider. 1 dose of Td every 10 years.  Varicella vaccine.** / Consult your health care provider.  HPV vaccine. / 3 doses over 6 months, if 63 or younger.  Measles, mumps, rubella (MMR) vaccine.** / You need at least 1 dose of MMR if you were born in 1957 or later. You may also need a second dose.  Pneumococcal 13-valent conjugate (PCV13) vaccine.**  / Consult your health care provider.  Pneumococcal polysaccharide (PPSV23) vaccine.** / 1 to 2 doses if you smoke cigarettes or if you have certain conditions.  Meningococcal vaccine.** / 1 dose if you are age 64 to 36 years and a Market researcher living in a residence hall, or have one of several medical conditions. You may also need additional booster doses.  Hepatitis A vaccine.** / Consult your health care provider.  Hepatitis B vaccine.** / Consult your health care provider.  Haemophilus influenzae type b (Hib) vaccine.** / Consult your health care provider. Ages 36 to 68  Blood pressure check.** / Every 1 to 2 years.  Lipid and cholesterol check.** / Every 5 years beginning at age 44.  Lung cancer screening. / Every year if you are aged 42 80 years and have a 30-pack-year history of smoking and currently smoke or have quit within the past 15 years. Yearly screening is stopped once you have quit smoking for at least 15 years or develop a health problem that would prevent you from having lung cancer treatment.  Fecal occult blood test (FOBT) of stool. / Every year beginning at age 19 and continuing until age 85. You may not have to do this test if you get a colonoscopy every 10 years.  Flexible sigmoidoscopy** or colonoscopy.** / Every 5 years for a flexible sigmoidoscopy or every 10 years for a colonoscopy beginning at age 56 and continuing until age 64.  Hepatitis C blood test.** / For all people born from [birth year] through 1965 and any individual with known risks for hepatitis C.  Skin self-exam. / Monthly.  Influenza vaccine. / Every year.  Tetanus, diphtheria, and acellular pertussis (Tdap/Td) vaccine.** / Consult your health care provider. 1 dose of Td every 10 years.  Varicella vaccine.** / Consult your health care provider.  Zoster vaccine.** / 1 dose for adults aged 93 years or older.  Measles, mumps, rubella (MMR) vaccine.** / You need at least 1 dose of MMR if  you were born in 1957 or later. You may also need a second dose.  Pneumococcal 13-valent conjugate (PCV13) vaccine.** / Consult  your health care provider.  Pneumococcal polysaccharide (PPSV23) vaccine.** / 1 to 2 doses if you smoke cigarettes or if you have certain conditions.  Meningococcal vaccine.** / Consult your health care provider.  Hepatitis A vaccine.** / Consult your health care provider.  Hepatitis B vaccine.** / Consult your health care provider.  Haemophilus influenzae type b (Hib) vaccine.** / Consult your health care provider. Ages 69 and over  Blood pressure check.** / Every 1 to 2 years.  Lipid and cholesterol check.**/ Every 5 years beginning at age 73.  Lung cancer screening. / Every year if you are aged 81 80 years and have a 30-pack-year history of smoking and currently smoke or have quit within the past 15 years. Yearly screening is stopped once you have quit smoking for at least 15 years or develop a health problem that would prevent you from having lung cancer treatment.  Fecal occult blood test (FOBT) of stool. / Every year beginning at age 60 and continuing until age 75. You may not have to do this test if you get a colonoscopy every 10 years.  Flexible sigmoidoscopy** or colonoscopy.** / Every 5 years for a flexible sigmoidoscopy or every 10 years for a colonoscopy beginning at age 30 and continuing until age 34.  Hepatitis C blood test.** / For all people born from [birth year] through 1965 and any individual with known risks for hepatitis C.  Abdominal aortic aneurysm (AAA) screening.** / A one-time screening for ages 66 to 72 years who are current or former smokers.  Skin self-exam. / Monthly.  Influenza vaccine. / Every year.  Tetanus, diphtheria, and acellular pertussis (Tdap/Td) vaccine.** / 1 dose of Td every 10 years.  Varicella vaccine.** / Consult your health care provider.  Zoster vaccine.** / 1 dose for adults aged 61 years or older.  Pneumococcal  13-valent conjugate (PCV13) vaccine.** / Consult your health care provider.  Pneumococcal polysaccharide (PPSV23) vaccine.** / 1 dose for all adults aged 74 years and older.  Meningococcal vaccine.** / Consult your health care provider.  Hepatitis A vaccine.** / Consult your health care provider.  Hepatitis B vaccine.** / Consult your health care provider.  Haemophilus influenzae type b (Hib) vaccine.** / Consult your health care provider. **Family history and personal history of risk and conditions may change your health care provider's recommendations. Document Released: 02/02/2002 Document Revised: 09/27/2013 Document Reviewed: 05/04/2011 [facility] Patient Information 2014 [place], [place].

## 2014-03-06 NOTE — Progress Notes (Signed)
Subjective:    [name] [name] is a 58 y.o. male who presents to [facility] today:  [name] [name] is a very pleasant 58 year old male who works here at [employer] in [employer].  He only has past medical history significant for GERD for which she is status post fundoplication in the late 1990s, some shoulder pain which resolved after he received an MRI and found nothing wrong, and consistently low HDL found on laboratory results.   He is a never smoker. He is married to his wife of over 25 years. They have one son who is currently on a scholarship for dancing at the [facility] and has started working as a Equities trader at [employer].    His one major chronic issue is GERD as above, controlled with Protonix.  Has had recent refill for this.  Also taking daily fish oil supplementation to help with lowering triglycerides.    He denies fever, unusual weight change, decreased hearing, chest pain, palpitations, pre-syncopal or syncopal episodes, dyspnea on exertion, prolonged cough, hemoptysis, change in bowel habits, melena, hematochezia, severe indigestion/heartburn, nausea/vomiting/abdominal pain, genital sores, muscle weakness, difficulty walking, abnormal bleeding, or enlarged lymph nodes.   Prev health:  Patient is up to date on preventative health except for requiring updated Tetanus shot.    The following portions of the patient's history were reviewed and updated as appropriate: allergies, current medications, past medical history, family and social history, and problem list. Patient is a nonsmoker.    PMH reviewed.  Past Medical History  Diagnosis Date  . GERD (gastroesophageal reflux disease)   . Varicose veins    Past Surgical History  Procedure Laterality Date  . Esophagogastric fundoplication  8546    Medications reviewed. Current Outpatient Prescriptions  Medication Sig Dispense Refill  . fish oil-omega-3 fatty acids 1000 MG capsule Take 2 g by mouth daily.        .  pantoprazole (PROTONIX) 40 MG tablet Take 1 tablet (40 mg total) by mouth daily.  30 tablet  3  . white petrolatum ointment Apply topically as needed for dry skin.  30 g  0   No current facility-administered medications for this visit.     Objective:   Physical Exam BP 129/87  Pulse 68  Ht 6' (1.829 m)  Wt 233 lb (105.688 kg)  BMI 31.59 kg/m2 Gen:  Alert, cooperative patient who appears stated age in no acute distress.  Vital signs reviewed. HEENT:  [place]/AT.  EOMI, PERRL.  MMM, tonsils non-erythematous, non-edematous.  External ears WNL, Bilateral TM's normal without retraction, redness or bulging.  Cardiac:  Regular rate and rhythm without murmur auscultated.  Good S1/S2. Pulm:  Clear to auscultation bilaterally with good air movement.  No wheezes or rales noted.   Abd:  Soft/nondistended/nontender.  Good bowel sounds throughout all four quadrants.  No masses noted.  Exts: No LE edema Skin:  Intact without rashes noted   No results found for this or any previous visit (from the past 72 hour(s)).

## 2014-03-07 DIAGNOSIS — Z0000 Encounter for general adult medical examination without abnormal findings: Secondary | ICD-10-CM | POA: Insufficient documentation

## 2014-03-07 NOTE — Assessment & Plan Note (Signed)
Rechecking labs today.

## 2014-03-07 NOTE — Assessment & Plan Note (Signed)
Tetanus shot today. 

## 2014-03-07 NOTE — Assessment & Plan Note (Signed)
Improved today.

## 2014-03-07 NOTE — Assessment & Plan Note (Signed)
Counseled on weight loss today and exercise.

## 2014-07-20 ENCOUNTER — Encounter: Payer: Self-pay | Admitting: Emergency Medicine

## 2014-07-20 ENCOUNTER — Emergency Department
Admission: EM | Admit: 2014-07-20 | Discharge: 2014-07-20 | Disposition: A | Discharge status: Home / Self Care | Payer: 59 | Source: Home / Self Care | Attending: Family Medicine | Admitting: Family Medicine

## 2014-07-20 DIAGNOSIS — K1370 Unspecified lesions of oral mucosa: Secondary | ICD-10-CM

## 2014-07-20 NOTE — ED Notes (Signed)
Mouth lesions under tongue x 2 weeks

## 2014-07-20 NOTE — ED Provider Notes (Signed)
CSN: [id number]     Arrival date & time 07/20/14  1450 History   First MD Initiated Contact with Patient 07/20/14 1510     Chief Complaint  Patient presents with  . Mouth Lesions      HPI Comments: Patient noticed several soft nodules under his tongue about 2 weeks ago.    The history is provided by the patient.    Past Medical History  Diagnosis Date  . GERD (gastroesophageal reflux disease)   . Varicose veins    Past Surgical History  Procedure Laterality Date  . Esophagogastric fundoplication  3532   Family History  Problem Relation Age of Onset  . Diabetes Mother   . Hypertension Mother   . Diabetes Father   . Hypertension Father    History  Substance Use Topics  . Smoking status: Never Smoker   . Smokeless tobacco: Not on file  . Alcohol Use: 0.0 oz/week    0 Cans of beer per week    Review of Systems  Constitutional: Negative for fever, chills and fatigue.  HENT: Positive for mouth sores. Negative for sore throat and trouble swallowing.   All other systems reviewed and are negative.   Allergies  Review of patient's allergies indicates not on file.  Home Medications   Prior to Admission medications   Medication Sig Start Date End Date Taking? Authorizing Provider  fish oil-omega-3 fatty acids 1000 MG capsule Take 2 g by mouth daily.      Historical Provider, MD  pantoprazole (PROTONIX) 40 MG tablet Take 1 tablet (40 mg total) by mouth daily. 01/19/14   Alveda Reasons, MD  white petrolatum ointment Apply topically as needed for dry skin. 11/20/12   Rosana Hoes, MD   BP 149/104  Pulse 84  Temp(Src) 98.1 F (36.7 C) (Oral)  Ht 6' (1.829 m)  Wt 234 lb (106.142 kg)  BMI 31.73 kg/m2  SpO2 97% Physical Exam  Nursing note and vitals reviewed. Constitutional: He appears well-developed and well-nourished. No distress.  Patient is obese (BMI 31.7)  HENT:  Head: Normocephalic.  Nose: Nose normal.  Mouth/Throat:    Underneath the tongue, adjacent to  frenulum anteriorly are three papules, the largest about 47mm diameter by 60mm long.  No tenderness to palpation.  There is also a narrow strip of erythema on the left undersurface of tongue.  Eyes: Conjunctivae and EOM are normal. Pupils are equal, round, and reactive to light.  Neck: Neck supple.  Lymphadenopathy:       Head (right side): No submental, no submandibular and no tonsillar adenopathy present.       Head (left side): No submental, no submandibular and no tonsillar adenopathy present.    He has no cervical adenopathy.    ED Course  Procedures  none      MDM   1. Mouth lesion; ?etiology   Recommend follow-up with ENT as soon as possible for biopsy.    Kandra Nicolas, MD 07/22/14 [phone]

## 2014-08-16 ENCOUNTER — Other Ambulatory Visit: Payer: Self-pay | Admitting: *Deleted

## 2014-08-16 MED ORDER — PANTOPRAZOLE SODIUM 40 MG PO TBEC: 40.0000 mg | DELAYED_RELEASE_TABLET | Freq: Every day | ORAL | Status: DC

## 2014-10-05 ENCOUNTER — Other Ambulatory Visit: Payer: Self-pay | Admitting: Family Medicine

## 2015-03-12 ENCOUNTER — Ambulatory Visit (INDEPENDENT_AMBULATORY_CARE_PROVIDER_SITE_OTHER): Payer: 59 | Admitting: Family Medicine

## 2015-03-12 ENCOUNTER — Encounter: Payer: Self-pay | Admitting: Family Medicine

## 2015-03-12 VITALS — BP 138/94 | HR 85 | Ht 72.0 in | Wt 245.8 lb

## 2015-03-12 DIAGNOSIS — Z0000 Encounter for general adult medical examination without abnormal findings: Secondary | ICD-10-CM

## 2015-03-12 DIAGNOSIS — E785 Hyperlipidemia, unspecified: Secondary | ICD-10-CM

## 2015-03-12 DIAGNOSIS — F4321 Adjustment disorder with depressed mood: Secondary | ICD-10-CM

## 2015-03-12 DIAGNOSIS — R030 Elevated blood-pressure reading, without diagnosis of hypertension: Secondary | ICD-10-CM

## 2015-03-12 DIAGNOSIS — G2581 Restless legs syndrome: Secondary | ICD-10-CM

## 2015-03-12 DIAGNOSIS — L918 Other hypertrophic disorders of the skin: Secondary | ICD-10-CM

## 2015-03-12 DIAGNOSIS — IMO0001 Reserved for inherently not codable concepts without codable children: Secondary | ICD-10-CM

## 2015-03-12 LAB — COMPREHENSIVE METABOLIC PANEL
ALK PHOS: 78 U/L (ref 39–117)
ALT: 46 U/L (ref 0–53)
AST: 36 U/L (ref 0–37)
Albumin: 4.2 g/dL (ref 3.5–5.2)
BILIRUBIN TOTAL: 0.7 mg/dL (ref 0.2–1.2)
BUN: 20 mg/dL (ref 6–23)
CHLORIDE: 106 meq/L (ref 96–112)
CO2: 29 mEq/L (ref 19–32)
Calcium: 8.6 mg/dL (ref 8.4–10.5)
Creat: 0.8 mg/dL (ref 0.50–1.35)
Glucose, Bld: 92 mg/dL (ref 70–99)
POTASSIUM: 4.4 meq/L (ref 3.5–5.3)
SODIUM: 138 meq/L (ref 135–145)
TOTAL PROTEIN: 6.6 g/dL (ref 6.0–8.3)

## 2015-03-12 LAB — LIPID PANEL
Cholesterol: 106 mg/dL (ref 0–200)
HDL: 20 mg/dL — ABNORMAL LOW (ref >=40)
LDL CALC: 54 mg/dL (ref 0–99)
TRIGLYCERIDES: 158 mg/dL — AB (ref <150)
Total CHOL/HDL Ratio: 5.3 Ratio
VLDL: 32 mg/dL (ref 0–40)

## 2015-03-12 LAB — CBC
HCT: 38.6 % — ABNORMAL LOW (ref 39.0–52.0)
HEMOGLOBIN: 11.9 g/dL — AB (ref 13.0–17.0)
MCH: 23.2 pg — ABNORMAL LOW (ref 26.0–34.0)
MCHC: 30.8 g/dL (ref 30.0–36.0)
MCV: 75.2 fL — ABNORMAL LOW (ref 78.0–100.0)
Platelets: 165 10*3/uL (ref 150–400)
RBC: 5.13 MIL/uL (ref 4.22–5.81)
RDW: 15.9 % — AB (ref 11.5–15.5)
WBC: 4.4 10*3/uL (ref 4.0–10.5)

## 2015-03-12 NOTE — Progress Notes (Signed)
Subjective:    [name] [name] is a 59 y.o. male who presents to [facility] today:  1.  Check-up:  Patient desires yearly physical exam.  He describes some issues, as below:  2.  Trouble sleeping:  Father passed away in 12-10-2023, day after Thanksgiving.  Complications from multiple falls leading to massive MI.  Has been having racing thoughts since then at night.  Worries about his family relations and losing any other family members.  Otherwise denies any depressive symptoms during the day.  Had good relationship with father and as death not entirely unexpected, feels good overall about the way he passed.    3.  Restless legs:  Describes 2-3 x weekly issues with "legs not wanting to stop moving."  Occasionally with aching in them.  Has never had low Hgb or trouble with iron.  Resolved with [place] PM.  Doesn't have to take this every night.  This also helps with #2 above.     Checking HIV today.  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    The patient denies fever, unusual weight change, decreased hearing, chest pain, palpitations, pre-syncopal or syncopal episodes, dyspnea on exertion, prolonged cough, hemoptysis, change in bowel habits, melena, hematochezia, severe indigestion/heartburn, nausea/vomiting/abdominal pain, muscle weakness, difficulty walking, abnormal bleeding, or enlarged lymph nodes.     The following portions of the patient's history were reviewed and updated as appropriate: allergies, current medications, past medical history, family and social history, and problem list. Patient is a nonsmoker.    PMH reviewed.  Past Medical History  Diagnosis Date  . GERD (gastroesophageal reflux disease)   . Varicose veins    Past Surgical History  Procedure Laterality Date  . Esophagogastric fundoplication  9892    Medications reviewed. Current Outpatient Prescriptions  Medication Sig Dispense Refill  . fish oil-omega-3 fatty acids 1000 MG capsule Take 2 g by mouth  daily.      . pantoprazole (PROTONIX) 40 MG tablet TAKE 1 TABLET BY MOUTH DAILY. 90 tablet 2  . white petrolatum ointment Apply topically as needed for dry skin. 30 g 0   No current facility-administered medications for this visit.     Objective:   Physical Exam BP 138/94 mmHg  Pulse 85  Ht 6' (1.829 m)  Wt 245 lb 12.8 oz (111.494 kg)  BMI 33.33 kg/m2 Gen:  Alert, cooperative patient who appears stated age in no acute distress.  Vital signs reviewed. HEENT:  [place]/AT.  EOMI, PERRL.  MMM, tonsils non-erythematous, non-edematous.  External ears WNL, Bilateral TM's normal without retraction, redness or bulging.  Neck:  No LAD Cardiac:  Regular rate and rhythm without murmur auscultated.   Pulm:  Clear to auscultation bilaterally with good air movement.    Abd:  Soft/nondistended/nontender.  Good bowel sounds throughout all four quadrants.  No masses noted.  Exts: Non edematous BL  LE, warm and well perfused.  Neuro:  No focal deficits noted throughout.  Ambulates well. Psych:  Not depressed or anxious appearing.  Linear and coherent thought process as evidenced by speech pattern. Smiles spontaneously.  Skin:  Skin tag noted just inferior to Left eye.  Has been treated with cryotherapy last week.  Also with 0.7 mm skin tag noted Left lower jaw just below angle of mandible.   No results found for this or any previous visit (from the past 72 hour(s)).

## 2015-03-12 NOTE — Assessment & Plan Note (Signed)
Checking lipid panel today.   Has been at goal last several checks.  Not on statin

## 2015-03-12 NOTE — Assessment & Plan Note (Signed)
Usual grieving period. To FU if worsens or still causing him issues after May -- 6 months after father passing.   No SI/HI

## 2015-03-12 NOTE — Assessment & Plan Note (Signed)
One under eye treated by dermatologist. States he doesn't want to go back there.  One on his chin irritates him when shaving. Cryotherapy performed today -- 2 blasts from cryo can, each about 5 seconds in duration.  Waited for white to clear before second blast.  Band-aid applied.  Some mild erythema but otherwise skin looks good.  Tolerated procedure well.

## 2015-03-12 NOTE — Assessment & Plan Note (Signed)
Elevated diastolic today.  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  Will obtain records and look at BP from there before making any final diagnoses.

## 2015-03-12 NOTE — Assessment & Plan Note (Signed)
Declines treatment today.   Will FU if no improvement.

## 2015-03-12 NOTE — Patient Instructions (Signed)
Come back to see me in 4 - 6 weeks to look at the spot on your neck.  We will likely need to freeze it again.  It was good to see you today.  Let me know if your racing thoughts or legs get worse.    Preventive Care for Adults A healthy lifestyle and preventive care can promote health and wellness. Preventive health guidelines for men include the following key practices:  A routine yearly physical is a good way to check with your health care provider about your health and preventative screening. It is a chance to share any concerns and updates on your health and to receive a thorough exam.  Visit your dentist for a routine exam and preventative care every 6 months. Brush your teeth twice a day and floss once a day. Good oral hygiene prevents tooth decay and gum disease.  The frequency of eye exams is based on your age, health, family medical history, use of contact lenses, and other factors. Follow your health care provider's recommendations for frequency of eye exams.  Eat a healthy diet. Foods such as vegetables, fruits, whole grains, low-fat dairy products, and lean protein foods contain the nutrients you need without too many calories. Decrease your intake of foods high in solid fats, added sugars, and salt. Eat the right amount of calories for you.Get information about a proper diet from your health care provider, if necessary.  Regular physical exercise is one of the most important things you can do for your health. Most adults should get at least 150 minutes of moderate-intensity exercise (any activity that increases your heart rate and causes you to sweat) each week. In addition, most adults need muscle-strengthening exercises on 2 or more days a week.  Maintain a healthy weight. The body mass index (BMI) is a screening tool to identify possible weight problems. It provides an estimate of body fat based on height and weight. Your health care provider can find your BMI and can help you  achieve or maintain a healthy weight.For adults 20 years and older:  A BMI below 18.5 is considered underweight.  A BMI of 18.5 to 24.9 is normal.  A BMI of 25 to 29.9 is considered overweight.  A BMI of 30 and above is considered obese.  Maintain normal blood lipids and cholesterol levels by exercising and minimizing your intake of saturated fat. Eat a balanced diet with plenty of fruit and vegetables. Blood tests for lipids and cholesterol should begin at age 80 and be repeated every 5 years. If your lipid or cholesterol levels are high, you are over 50, or you are at high risk for heart disease, you may need your cholesterol levels checked more frequently.Ongoing high lipid and cholesterol levels should be treated with medicines if diet and exercise are not working.  If you smoke, find out from your health care provider how to quit. If you do not use tobacco, do not start.  Lung cancer screening is recommended for adults aged 64-80 years who are at high risk for developing lung cancer because of a history of smoking. A yearly low-dose CT scan of the lungs is recommended for people who have at least a 30-pack-year history of smoking and are a current smoker or have quit within the past 15 years. A pack year of smoking is smoking an average of 1 pack of cigarettes a day for 1 year (for example: 1 pack a day for 30 years or 2 packs a day for  15 years). Yearly screening should continue until the smoker has stopped smoking for at least 15 years. Yearly screening should be stopped for people who develop a health problem that would prevent them from having lung cancer treatment.  If you choose to drink alcohol, do not have more than 2 drinks per day. One drink is considered to be 12 ounces (355 mL) of beer, 5 ounces (148 mL) of wine, or 1.5 ounces (44 mL) of liquor.  Avoid use of street drugs. Do not share needles with anyone. Ask for help if you need support or instructions about stopping the use of  drugs.  High blood pressure causes heart disease and increases the risk of stroke. Your blood pressure should be checked at least every 1-2 years. Ongoing high blood pressure should be treated with medicines, if weight loss and exercise are not effective.  If you are 71-75 years old, ask your health care provider if you should take aspirin to prevent heart disease.  Diabetes screening involves taking a blood sample to check your fasting blood sugar level. This should be done once every 3 years, after age 45, if you are within normal weight and without risk factors for diabetes. Testing should be considered at a younger age or be carried out more frequently if you are overweight and have at least 1 risk factor for diabetes.  Colorectal cancer can be detected and often prevented. Most routine colorectal cancer screening begins at the age of 71 and continues through age 58. However, your health care provider may recommend screening at an earlier age if you have risk factors for colon cancer. On a yearly basis, your health care provider may provide home test kits to check for hidden blood in the stool. Use of a small camera at the end of a tube to directly examine the colon (sigmoidoscopy or colonoscopy) can detect the earliest forms of colorectal cancer. Talk to your health care provider about this at age 66, when routine screening begins. Direct exam of the colon should be repeated every 5-10 years through age 8, unless early forms of precancerous polyps or small growths are found.  People who are at an increased risk for hepatitis B should be screened for this virus. You are considered at high risk for hepatitis B if:  You were born in a country where hepatitis B occurs often. Talk with your health care provider about which countries are considered high risk.  Your parents were born in a high-risk country and you have not received a shot to protect against hepatitis B (hepatitis B vaccine).  You have  HIV or AIDS.  You use needles to inject street drugs.  You live with, or have sex with, someone who has hepatitis B.  You are a man who has sex with other men (MSM).  You get hemodialysis treatment.  You take certain medicines for conditions such as cancer, organ transplantation, and autoimmune conditions.  Hepatitis C blood testing is recommended for all people born from [birth year] through 1965 and any individual with known risks for hepatitis C.  Practice safe sex. Use condoms and avoid high-risk sexual practices to reduce the spread of sexually transmitted infections (STIs). STIs include gonorrhea, chlamydia, syphilis, trichomonas, herpes, HPV, and human immunodeficiency virus (HIV). Herpes, HIV, and HPV are viral illnesses that have no cure. They can result in disability, cancer, and death.  If you are at risk of being infected with HIV, it is recommended that you take a prescription medicine daily  to prevent HIV infection. This is called preexposure prophylaxis (PrEP). You are considered at risk if:  You are a man who has sex with other men (MSM) and have other risk factors.  You are a heterosexual man, are sexually active, and are at increased risk for HIV infection.  You take drugs by injection.  You are sexually active with a partner who has HIV.  Talk with your health care provider about whether you are at high risk of being infected with HIV. If you choose to begin PrEP, you should first be tested for HIV. You should then be tested every 3 months for as long as you are taking PrEP.  A one-time screening for abdominal aortic aneurysm (AAA) and surgical repair of large AAAs by ultrasound are recommended for men ages 32 to 11 years who are current or former smokers.  Healthy men should no longer receive prostate-specific antigen (PSA) blood tests as part of routine cancer screening. Talk with your health care provider about prostate cancer screening.  Testicular cancer screening is  not recommended for adult males who have no symptoms. Screening includes self-exam, a health care provider exam, and other screening tests. Consult with your health care provider about any symptoms you have or any concerns you have about testicular cancer.  Use sunscreen. Apply sunscreen liberally and repeatedly throughout the day. You should seek shade when your shadow is shorter than you. Protect yourself by wearing long sleeves, pants, a wide-brimmed hat, and sunglasses year round, whenever you are outdoors.  Once a month, do a whole-body skin exam, using a mirror to look at the skin on your back. Tell your health care provider about new moles, moles that have irregular borders, moles that are larger than a pencil eraser, or moles that have changed in shape or color.  Stay current with required vaccines (immunizations).  Influenza vaccine. All adults should be immunized every year.  Tetanus, diphtheria, and acellular pertussis (Td, Tdap) vaccine. An adult who has not previously received Tdap or who does not know his vaccine status should receive 1 dose of Tdap. This initial dose should be followed by tetanus and diphtheria toxoids (Td) booster doses every 10 years. Adults with an unknown or incomplete history of completing a 3-dose immunization series with Td-containing vaccines should begin or complete a primary immunization series including a Tdap dose. Adults should receive a Td booster every 10 years.  Varicella vaccine. An adult without evidence of immunity to varicella should receive 2 doses or a second dose if he has previously received 1 dose.  Human papillomavirus (HPV) vaccine. Males aged 34-21 years who have not received the vaccine previously should receive the 3-dose series. Males aged 22-26 years may be immunized. Immunization is recommended through the age of 73 years for any male who has sex with males and did not get any or all doses earlier. Immunization is recommended for any  person with an immunocompromised condition through the age of 66 years if he did not get any or all doses earlier. During the 3-dose series, the second dose should be obtained 4-8 weeks after the first dose. The third dose should be obtained 24 weeks after the first dose and 16 weeks after the second dose.  Zoster vaccine. One dose is recommended for adults aged 45 years or older unless certain conditions are present.  Measles, mumps, and rubella (MMR) vaccine. Adults born before [birth year] generally are considered immune to measles and mumps. Adults born in [birth year] or later should  have 1 or more doses of MMR vaccine unless there is a contraindication to the vaccine or there is laboratory evidence of immunity to each of the three diseases. A routine second dose of MMR vaccine should be obtained at least 28 days after the first dose for students attending postsecondary schools, health care workers, or international travelers. People who received inactivated measles vaccine or an unknown type of measles vaccine during 1963-1967 should receive 2 doses of MMR vaccine. People who received inactivated mumps vaccine or an unknown type of mumps vaccine before 1979 and are at high risk for mumps infection should consider immunization with 2 doses of MMR vaccine. Unvaccinated health care workers born before [birth year] who lack laboratory evidence of measles, mumps, or rubella immunity or laboratory confirmation of disease should consider measles and mumps immunization with 2 doses of MMR vaccine or rubella immunization with 1 dose of MMR vaccine.  Pneumococcal 13-valent conjugate (PCV13) vaccine. When indicated, a person who is uncertain of his immunization history and has no record of immunization should receive the PCV13 vaccine. An adult aged 8 years or older who has certain medical conditions and has not been previously immunized should receive 1 dose of PCV13 vaccine. This PCV13 should be followed with a dose of pneumococcal  polysaccharide (PPSV23) vaccine. The PPSV23 vaccine dose should be obtained at least 8 weeks after the dose of PCV13 vaccine. An adult aged 33 years or older who has certain medical conditions and previously received 1 or more doses of PPSV23 vaccine should receive 1 dose of PCV13. The PCV13 vaccine dose should be obtained 1 or more years after the last PPSV23 vaccine dose.  Pneumococcal polysaccharide (PPSV23) vaccine. When PCV13 is also indicated, PCV13 should be obtained first. All adults aged 55 years and older should be immunized. An adult younger than age 12 years who has certain medical conditions should be immunized. Any person who resides in a nursing home or long-term care facility should be immunized. An adult smoker should be immunized. People with an immunocompromised condition and certain other conditions should receive both PCV13 and PPSV23 vaccines. People with human immunodeficiency virus (HIV) infection should be immunized as soon as possible after diagnosis. Immunization during chemotherapy or radiation therapy should be avoided. Routine use of PPSV23 vaccine is not recommended for American Indians, [place] Natives, or people younger than 65 years unless there are medical conditions that require PPSV23 vaccine. When indicated, people who have unknown immunization and have no record of immunization should receive PPSV23 vaccine. One-time revaccination 5 years after the first dose of PPSV23 is recommended for people aged 19-64 years who have chronic kidney failure, nephrotic syndrome, asplenia, or immunocompromised conditions. People who received 1-2 doses of PPSV23 before age 72 years should receive another dose of PPSV23 vaccine at age 91 years or later if at least 5 years have passed since the previous dose. Doses of PPSV23 are not needed for people immunized with PPSV23 at or after age 55 years.  Meningococcal vaccine. Adults with asplenia or persistent complement component deficiencies  should receive 2 doses of quadrivalent meningococcal conjugate (MenACWY-D) vaccine. The doses should be obtained at least 2 months apart. Microbiologists working with certain meningococcal bacteria, [employer] recruits, people at risk during an outbreak, and people who travel to or live in countries with a high rate of meningitis should be immunized. A first-year college student up through age 96 years who is living in a residence hall should receive a dose if he did not receive  a dose on or after his 16th birthday. Adults who have certain high-risk conditions should receive one or more doses of vaccine.  Hepatitis A vaccine. Adults who wish to be protected from this disease, have certain high-risk conditions, work with hepatitis A-infected animals, work in hepatitis A research labs, or travel to or work in countries with a high rate of hepatitis A should be immunized. Adults who were previously unvaccinated and who anticipate close contact with an international adoptee during the first 60 days after arrival in the [place] from a country with a high rate of hepatitis A should be immunized.  Hepatitis B vaccine. Adults should be immunized if they wish to be protected from this disease, have certain high-risk conditions, may be exposed to blood or other infectious body fluids, are household contacts or sex partners of hepatitis B positive people, are clients or workers in certain care facilities, or travel to or work in countries with a high rate of hepatitis B.  Haemophilus influenzae type b (Hib) vaccine. A previously unvaccinated person with asplenia or sickle cell disease or having a scheduled splenectomy should receive 1 dose of Hib vaccine. Regardless of previous immunization, a recipient of a hematopoietic stem cell transplant should receive a 3-dose series 6-12 months after his successful transplant. Hib vaccine is not recommended for adults with HIV infection. Preventive Service / Frequency Ages  88 to 55  Blood pressure check.** / Every 1 to 2 years.  Lipid and cholesterol check.** / Every 5 years beginning at age 39.  Hepatitis C blood test.** / For any individual with known risks for hepatitis C.  Skin self-exam. / Monthly.  Influenza vaccine. / Every year.  Tetanus, diphtheria, and acellular pertussis (Tdap, Td) vaccine.** / Consult your health care provider. 1 dose of Td every 10 years.  Varicella vaccine.** / Consult your health care provider.  HPV vaccine. / 3 doses over 6 months, if 76 or younger.  Measles, mumps, rubella (MMR) vaccine.** / You need at least 1 dose of MMR if you were born in 1957 or later. You may also need a second dose.  Pneumococcal 13-valent conjugate (PCV13) vaccine.** / Consult your health care provider.  Pneumococcal polysaccharide (PPSV23) vaccine.** / 1 to 2 doses if you smoke cigarettes or if you have certain conditions.  Meningococcal vaccine.** / 1 dose if you are age 45 to 74 years and a Market researcher living in a residence hall, or have one of several medical conditions. You may also need additional booster doses.  Hepatitis A vaccine.** / Consult your health care provider.  Hepatitis B vaccine.** / Consult your health care provider.  Haemophilus influenzae type b (Hib) vaccine.** / Consult your health care provider. Ages 89 to 71  Blood pressure check.** / Every 1 to 2 years.  Lipid and cholesterol check.** / Every 5 years beginning at age 91.  Lung cancer screening. / Every year if you are aged 36-80 years and have a 30-pack-year history of smoking and currently smoke or have quit within the past 15 years. Yearly screening is stopped once you have quit smoking for at least 15 years or develop a health problem that would prevent you from having lung cancer treatment.  Fecal occult blood test (FOBT) of stool. / Every year beginning at age 2 and continuing until age 31. You may not have to do this test if you get a  colonoscopy every 10 years.  Flexible sigmoidoscopy** or colonoscopy.** / Every 5 years for a  flexible sigmoidoscopy or every 10 years for a colonoscopy beginning at age 12 and continuing until age 18.  Hepatitis C blood test.** / For all people born from [birth year] through 1965 and any individual with known risks for hepatitis C.  Skin self-exam. / Monthly.  Influenza vaccine. / Every year.  Tetanus, diphtheria, and acellular pertussis (Tdap/Td) vaccine.** / Consult your health care provider. 1 dose of Td every 10 years.  Varicella vaccine.** / Consult your health care provider.  Zoster vaccine.** / 1 dose for adults aged 71 years or older.  Measles, mumps, rubella (MMR) vaccine.** / You need at least 1 dose of MMR if you were born in 1957 or later. You may also need a second dose.  Pneumococcal 13-valent conjugate (PCV13) vaccine.** / Consult your health care provider.  Pneumococcal polysaccharide (PPSV23) vaccine.** / 1 to 2 doses if you smoke cigarettes or if you have certain conditions.  Meningococcal vaccine.** / Consult your health care provider.  Hepatitis A vaccine.** / Consult your health care provider.  Hepatitis B vaccine.** / Consult your health care provider.  Haemophilus influenzae type b (Hib) vaccine.** / Consult your health care provider. Ages 27 and over  Blood pressure check.** / Every 1 to 2 years.  Lipid and cholesterol check.**/ Every 5 years beginning at age 63.  Lung cancer screening. / Every year if you are aged 29-80 years and have a 30-pack-year history of smoking and currently smoke or have quit within the past 15 years. Yearly screening is stopped once you have quit smoking for at least 15 years or develop a health problem that would prevent you from having lung cancer treatment.  Fecal occult blood test (FOBT) of stool. / Every year beginning at age 11 and continuing until age 80. You may not have to do this test if you get a colonoscopy every 10  years.  Flexible sigmoidoscopy** or colonoscopy.** / Every 5 years for a flexible sigmoidoscopy or every 10 years for a colonoscopy beginning at age 67 and continuing until age 21.  Hepatitis C blood test.** / For all people born from [birth year] through 1965 and any individual with known risks for hepatitis C.  Abdominal aortic aneurysm (AAA) screening.** / A one-time screening for ages 42 to 36 years who are current or former smokers.  Skin self-exam. / Monthly.  Influenza vaccine. / Every year.  Tetanus, diphtheria, and acellular pertussis (Tdap/Td) vaccine.** / 1 dose of Td every 10 years.  Varicella vaccine.** / Consult your health care provider.  Zoster vaccine.** / 1 dose for adults aged 33 years or older.  Pneumococcal 13-valent conjugate (PCV13) vaccine.** / Consult your health care provider.  Pneumococcal polysaccharide (PPSV23) vaccine.** / 1 dose for all adults aged 22 years and older.  Meningococcal vaccine.** / Consult your health care provider.  Hepatitis A vaccine.** / Consult your health care provider.  Hepatitis B vaccine.** / Consult your health care provider.  Haemophilus influenzae type b (Hib) vaccine.** / Consult your health care provider. **Family history and personal history of risk and conditions may change your health care provider's recommendations. Document Released: 02/02/2002 Document Revised: 12/12/2013 Document Reviewed: 05/04/2011 [facility] Patient Information 2015 [place], [place]. This information is not intended to replace advice given to you by your health care provider. Make sure you discuss any questions you have with your health care provider.

## 2015-03-12 NOTE — Assessment & Plan Note (Signed)
Checking HIV today.

## 2015-03-13 LAB — HIV ANTIBODY (ROUTINE TESTING W REFLEX): HIV: NONREACTIVE

## 2015-03-14 ENCOUNTER — Encounter: Payer: Self-pay | Admitting: Family Medicine

## 2015-03-14 NOTE — Addendum Note (Signed)
Addended byMingo Amber, Kayleen Memos on: 03/14/2015 05:11 PM   Modules accepted: Miquel Dunn

## 2015-05-03 ENCOUNTER — Ambulatory Visit (INDEPENDENT_AMBULATORY_CARE_PROVIDER_SITE_OTHER): Payer: 59 | Admitting: Family Medicine

## 2015-05-03 VITALS — BP 140/90 | HR 85 | Wt 241.8 lb

## 2015-05-03 DIAGNOSIS — R059 Cough, unspecified: Secondary | ICD-10-CM

## 2015-05-03 DIAGNOSIS — R05 Cough: Secondary | ICD-10-CM

## 2015-05-03 MED ORDER — BENZONATATE 100 MG PO CAPS: 100.0000 mg | ORAL_CAPSULE | Freq: Three times a day (TID) | ORAL | Status: DC | PRN

## 2015-05-03 NOTE — Patient Instructions (Signed)
Thank you for coming to see me today. It was a pleasure. Today we talked about:   Cough: this is most likely a viral infection. I will prescribe Tessalon Perles for symptomatic relief of your cough.  If you have any questions or concerns, please do not hesitate to call the office at [phone].  Sincerely,  Cordelia Poche, MD

## 2015-05-03 NOTE — Progress Notes (Signed)
    Subjective   [name] [name] is a 59 y.o. male that presents for a same day visit  1. Cough: Symptoms started one week ago. Symptoms have remained unchanged. Cough is generally non-productive. Symptoms are present throughout the day. He has associated sneezing at times. Sick contacts include mother and brother. No fevers, nausea, or chills. No chest pain.  ROS Per HPI  History  Substance Use Topics  . Smoking status: Never Smoker   . Smokeless tobacco: Not on file  . Alcohol Use: 0.0 oz/week    0 Cans of beer per week    No Known Allergies  Objective   BP 140/90 mmHg  Pulse 85  Wt 241 lb 12.8 oz (109.68 kg)  SpO2 100%  General: Well appearing, no distress HEENT: Normal TMs. Nose normal. Oropharynx clear and slightly erythematous with no ulcer. No cervical adenopathy.  Respiratory/Chest: Clear to auscultation bilaterally with no wheezing Neuro: Alert, oriented  Assessment and Plan   No orders of the defined types were placed in this encounter.   Bronchitis  Tessalon perles for symptomatic treatment  Follow-up if symptoms persist

## 2015-05-04 ENCOUNTER — Encounter: Payer: Self-pay | Admitting: Family Medicine

## 2015-05-09 ENCOUNTER — Telehealth: Payer: Self-pay | Admitting: Family Medicine

## 2015-05-09 NOTE — Telephone Encounter (Signed)
Will forward to physician who saw him last.  Unsure how long cough has bothered him.  May need to be seen again.

## 2015-05-09 NOTE — Telephone Encounter (Signed)
Still has cough. Was given tessalon pearls Doesn't seem to be strong enoughj Would like something stronger Please advise He might have a sinus infection Could dr prescribe Xpack or antibotic?

## 2015-05-10 ENCOUNTER — Telehealth: Payer: Self-pay | Admitting: Family Medicine

## 2015-05-10 NOTE — Telephone Encounter (Signed)
I'm out today as well.  Without having seen the patient, there are no good options for cough syrups.  I would recommend over the counter Robitussin as this is a good cough suppressant.  If he's still having trouble with cough Monday, he needs to be seen because it sounds like something else is going on.

## 2015-05-10 NOTE — Telephone Encounter (Signed)
Pt called and wanted to know if the doctor was going to send in a different cough syrup. Dr. Lonny Prude seen the patient last but he is on vacation. jw

## 2015-05-13 NOTE — Telephone Encounter (Signed)
Called pt and told him to try Robitussin OTC and that if it didn't help or get better by Thursday he should call and set up an appt. He stated that the cough comes in spurts and last for about 5 min and then goes away for a while and then has another episode. [name], April D, [place]

## 2015-05-23 ENCOUNTER — Ambulatory Visit (INDEPENDENT_AMBULATORY_CARE_PROVIDER_SITE_OTHER): Payer: 59 | Admitting: Family Medicine

## 2015-05-23 ENCOUNTER — Encounter: Payer: Self-pay | Admitting: Family Medicine

## 2015-05-23 VITALS — BP 142/89 | HR 87 | Temp 98.0°F | Ht 72.0 in | Wt 239.0 lb

## 2015-05-23 DIAGNOSIS — R05 Cough: Secondary | ICD-10-CM | POA: Diagnosis not present

## 2015-05-23 DIAGNOSIS — R059 Cough, unspecified: Secondary | ICD-10-CM

## 2015-05-23 MED ORDER — GUAIFENESIN-DM 100-10 MG/5ML PO SYRP: 5.0000 mL | ORAL_SOLUTION | ORAL | Status: DC | PRN

## 2015-05-23 MED ORDER — CETIRIZINE HCL 10 MG PO TABS: 10.0000 mg | ORAL_TABLET | Freq: Every day | ORAL | Status: DC

## 2015-05-23 NOTE — Progress Notes (Signed)
    Subjective   [name] [name] is a 59 y.o. male that presents for a same day visit  cough: Started mother's day weekend. No smoking hx. Tried tessalon with no improvement. Some sputum production. Not worsened with position. No SOB or wheezing. No SOB or chest pain. No runny nose.     History  Substance Use Topics  . Smoking status: Never Smoker   . Smokeless tobacco: Not on file  . Alcohol Use: 0.0 oz/week    0 Cans of beer per week    ROS Per HPI  Objective   BP 142/89 mmHg  Pulse 87  Temp(Src) 98 F (36.7 C) (Oral)  Ht 6' (1.829 m)  Wt 239 lb (108.41 kg)  BMI 32.41 kg/m2  General: NAD, alert, cooperative with exam, well-appearing HEENT: NCAT, PERRL, clear conjunctiva, oropharynx clear, supple neck, no LAD, boggy turbinates  Respiratory/Chest: CTABL, no wheezes, non-labored Cardiovascular: RRR, good S1/S2, no murmur, no edema, capillary refill brisk  Extremities: no rashes, normal turgor    Assessment and Plan   Please refer to problem based charting of assessment and plan

## 2015-05-23 NOTE — Patient Instructions (Signed)
Thank you for coming in,   I will order a chest x-ray and call you with the results.   If the cough continues after two more weeks then follow up with Dr. Mingo Amber  Please bring all of your medications with you to each visit.    Please feel free to call with any questions or concerns at any time, at [phone]. --Dr. Raeford Razor

## 2015-05-24 ENCOUNTER — Telehealth: Payer: Self-pay | Admitting: Family Medicine

## 2015-05-24 ENCOUNTER — Ambulatory Visit (HOSPITAL_COMMUNITY)
Admission: RE | Admit: 2015-05-24 | Discharge: 2015-05-24 | Disposition: A | Discharge status: Home / Self Care | Payer: 59 | Source: Ambulatory Visit | Attending: Family Medicine | Admitting: Family Medicine

## 2015-05-24 ENCOUNTER — Telehealth: Payer: Self-pay | Admitting: *Deleted

## 2015-05-24 ENCOUNTER — Encounter: Payer: Self-pay | Admitting: Family Medicine

## 2015-05-24 DIAGNOSIS — R059 Cough, unspecified: Secondary | ICD-10-CM

## 2015-05-24 DIAGNOSIS — R05 Cough: Secondary | ICD-10-CM | POA: Diagnosis present

## 2015-05-24 NOTE — Telephone Encounter (Signed)
Left VM for patient. If he calls back please have him speak with a nurse/CMA and relay the message that his chest x-ray did not show any signs of infection or fluid on his lungs.   If any questions then please take the best time and phone number to call and I will try to call him back.   Rosemarie Ax, MD PGY-2, [facility] 05/24/2015, 10:47 AM

## 2015-05-24 NOTE — Assessment & Plan Note (Signed)
Subacute in nature. No improvement with the tessalon. No fevers, SOB, and not related to meals.  No weight loss and never smoker.  Worse at night. Relieved with drinking EtOH.  Taking protonix  Could be postnasal drip. Doesn't appear to be sinusitis.  - CXR: no active disease  - start zyrtec and dextromethorphan/guanfenocin  - Discussed with Dr. Mingo Amber.  - f/u in two weeks if no improvement

## 2015-05-24 NOTE — Telephone Encounter (Signed)
Spoke with pt and informed him of previous phone note from Dr. Raeford Razor. [name], Journe Hallmark D, CMA;

## 2015-06-12 ENCOUNTER — Encounter (HOSPITAL_BASED_OUTPATIENT_CLINIC_OR_DEPARTMENT_OTHER): Payer: 59 | Attending: Surgery

## 2015-06-12 DIAGNOSIS — L97311 Non-pressure chronic ulcer of right ankle limited to breakdown of skin: Secondary | ICD-10-CM | POA: Insufficient documentation

## 2015-06-12 DIAGNOSIS — I872 Venous insufficiency (chronic) (peripheral): Secondary | ICD-10-CM | POA: Diagnosis not present

## 2015-06-12 DIAGNOSIS — L97811 Non-pressure chronic ulcer of other part of right lower leg limited to breakdown of skin: Secondary | ICD-10-CM | POA: Insufficient documentation

## 2015-06-12 DIAGNOSIS — I739 Peripheral vascular disease, unspecified: Secondary | ICD-10-CM | POA: Insufficient documentation

## 2015-06-12 DIAGNOSIS — I87311 Chronic venous hypertension (idiopathic) with ulcer of right lower extremity: Secondary | ICD-10-CM | POA: Diagnosis not present

## 2015-06-12 DIAGNOSIS — Z79899 Other long term (current) drug therapy: Secondary | ICD-10-CM | POA: Diagnosis not present

## 2015-06-13 ENCOUNTER — Other Ambulatory Visit: Payer: Self-pay | Admitting: Surgery

## 2015-06-13 DIAGNOSIS — I82403 Acute embolism and thrombosis of unspecified deep veins of lower extremity, bilateral: Secondary | ICD-10-CM

## 2015-06-17 ENCOUNTER — Encounter (HOSPITAL_COMMUNITY): Payer: 59

## 2015-06-19 ENCOUNTER — Ambulatory Visit (HOSPITAL_COMMUNITY)
Admission: RE | Admit: 2015-06-19 | Discharge: 2015-06-19 | Disposition: A | Discharge status: Home / Self Care | Payer: 59 | Source: Ambulatory Visit | Attending: Surgery | Admitting: Surgery

## 2015-06-19 DIAGNOSIS — I82403 Acute embolism and thrombosis of unspecified deep veins of lower extremity, bilateral: Secondary | ICD-10-CM | POA: Insufficient documentation

## 2015-06-19 DIAGNOSIS — I872 Venous insufficiency (chronic) (peripheral): Secondary | ICD-10-CM | POA: Diagnosis not present

## 2015-06-19 NOTE — Progress Notes (Signed)
VASCULAR LAB PRELIMINARY  ARTERIAL  Bilateral Duplex scan of the bilateral lower extremities reveal no evidence of significant plaque formation or stenosis. All Doppler waveforms throughout were triphasic with normal velocities   Christyne Mccain, RVS 06/19/2015, 3:53 PM

## 2015-06-26 ENCOUNTER — Encounter (HOSPITAL_BASED_OUTPATIENT_CLINIC_OR_DEPARTMENT_OTHER): Payer: 59 | Attending: Surgery

## 2015-06-26 DIAGNOSIS — L97312 Non-pressure chronic ulcer of right ankle with fat layer exposed: Secondary | ICD-10-CM | POA: Insufficient documentation

## 2015-06-26 DIAGNOSIS — F419 Anxiety disorder, unspecified: Secondary | ICD-10-CM | POA: Diagnosis not present

## 2015-06-26 DIAGNOSIS — I87311 Chronic venous hypertension (idiopathic) with ulcer of right lower extremity: Secondary | ICD-10-CM | POA: Insufficient documentation

## 2015-06-26 DIAGNOSIS — I739 Peripheral vascular disease, unspecified: Secondary | ICD-10-CM | POA: Insufficient documentation

## 2015-07-03 ENCOUNTER — Other Ambulatory Visit: Payer: Self-pay | Admitting: Surgery

## 2015-07-03 ENCOUNTER — Ambulatory Visit (HOSPITAL_COMMUNITY)
Admission: RE | Admit: 2015-07-03 | Discharge: 2015-07-03 | Disposition: A | Discharge status: Home / Self Care | Payer: 59 | Source: Ambulatory Visit | Attending: Vascular Surgery | Admitting: Vascular Surgery

## 2015-07-03 DIAGNOSIS — I87303 Chronic venous hypertension (idiopathic) without complications of bilateral lower extremity: Secondary | ICD-10-CM | POA: Diagnosis not present

## 2015-07-03 DIAGNOSIS — L97319 Non-pressure chronic ulcer of right ankle with unspecified severity: Secondary | ICD-10-CM

## 2015-07-03 DIAGNOSIS — I87311 Chronic venous hypertension (idiopathic) with ulcer of right lower extremity: Secondary | ICD-10-CM | POA: Diagnosis not present

## 2015-07-03 DIAGNOSIS — I82811 Embolism and thrombosis of superficial veins of right lower extremities: Secondary | ICD-10-CM | POA: Diagnosis not present

## 2015-07-10 DIAGNOSIS — I87311 Chronic venous hypertension (idiopathic) with ulcer of right lower extremity: Secondary | ICD-10-CM | POA: Diagnosis not present

## 2015-07-17 DIAGNOSIS — I87311 Chronic venous hypertension (idiopathic) with ulcer of right lower extremity: Secondary | ICD-10-CM | POA: Diagnosis not present

## 2015-07-24 ENCOUNTER — Encounter (HOSPITAL_BASED_OUTPATIENT_CLINIC_OR_DEPARTMENT_OTHER): Payer: 59 | Attending: Surgery

## 2015-07-24 DIAGNOSIS — I87311 Chronic venous hypertension (idiopathic) with ulcer of right lower extremity: Secondary | ICD-10-CM | POA: Diagnosis not present

## 2015-07-24 DIAGNOSIS — L97312 Non-pressure chronic ulcer of right ankle with fat layer exposed: Secondary | ICD-10-CM | POA: Diagnosis present

## 2015-07-24 DIAGNOSIS — I739 Peripheral vascular disease, unspecified: Secondary | ICD-10-CM | POA: Diagnosis not present

## 2015-07-24 DIAGNOSIS — F419 Anxiety disorder, unspecified: Secondary | ICD-10-CM | POA: Diagnosis not present

## 2015-07-31 DIAGNOSIS — L97312 Non-pressure chronic ulcer of right ankle with fat layer exposed: Secondary | ICD-10-CM | POA: Diagnosis not present

## 2015-08-05 ENCOUNTER — Encounter: Payer: Self-pay | Admitting: Vascular Surgery

## 2015-08-06 ENCOUNTER — Encounter: Payer: Self-pay | Admitting: Vascular Surgery

## 2015-08-06 ENCOUNTER — Ambulatory Visit (INDEPENDENT_AMBULATORY_CARE_PROVIDER_SITE_OTHER): Payer: 59 | Admitting: Vascular Surgery

## 2015-08-06 VITALS — BP 134/84 | HR 84 | Ht 72.0 in | Wt 244.1 lb

## 2015-08-06 DIAGNOSIS — I83899 Varicose veins of unspecified lower extremities with other complications: Secondary | ICD-10-CM | POA: Insufficient documentation

## 2015-08-06 DIAGNOSIS — I83891 Varicose veins of right lower extremities with other complications: Secondary | ICD-10-CM

## 2015-08-06 HISTORY — DX: Varicose veins of unspecified lower extremity with other complications: I83.899

## 2015-08-06 NOTE — Progress Notes (Signed)
Subjective:     Patient ID: [name] [name], male   DOB: 06/16/1956, 59 y.o.   MRN: [id number]  HPI this 59 year old employee of [employer] developed a stasis ulcer in the right ankle 3 months ago. He had previously had an ulcer 3 years ago which eventually healed after treatment in the wound center. He has a remote history of vein stripping of the right leg and high 0.10 years ago. He has had dark skin in the ankle area since that time. It has taken 2 months in the wound center with compression dressings to heal the ulcer in the right ankle which is recently occurred. He has no history of DVT but has had superficial thrombophlebitis in the past. He has no symptoms in the left leg. He does have chronic edema in the right ankle. This is not completely relieved with elastic compression stockings-long-leg 20-30 millimeter gradient.  Past Medical History  Diagnosis Date  . GERD (gastroesophageal reflux disease)   . Varicose veins     Social History  Substance Use Topics  . Smoking status: Never Smoker   . Smokeless tobacco: Not on file  . Alcohol Use: 0.6 oz/week    1 Cans of beer per week    Family History  Problem Relation Age of Onset  . Diabetes Mother   . Hypertension Mother   . Heart disease Mother   . Varicose Veins Mother   . Diabetes Father   . Hypertension Father   . Varicose Veins Father   . Heart attack Father   . Cancer Sister   . Diabetes Sister     No Known Allergies   Current outpatient prescriptions:  .  benzonatate (TESSALON) 100 MG capsule, Take 1-2 capsules (100-200 mg total) by mouth 3 (three) times daily as needed for cough., Disp: 30 capsule, Rfl: 0 .  cetirizine (ZYRTEC) 10 MG tablet, Take 1 tablet (10 mg total) by mouth daily., Disp: 30 tablet, Rfl: 1 .  fish oil-omega-3 fatty acids 1000 MG capsule, Take 2 g by mouth daily.  , Disp: , Rfl:  .  pantoprazole (PROTONIX) 40 MG tablet, TAKE 1 TABLET BY MOUTH DAILY., Disp: 90 tablet, Rfl: 2 .   guaiFENesin-dextromethorphan (ROBITUSSIN DM) 100-10 MG/5ML syrup, Take 5 mLs by mouth every 4 (four) hours as needed for cough. (Patient not taking: Reported on 08/06/2015), Disp: 118 mL, Rfl: 1 .  white petrolatum ointment, Apply topically as needed for dry skin. (Patient not taking: Reported on 08/06/2015), Disp: 30 g, Rfl: 0  Filed Vitals:   08/06/15 1434  BP: 134/84  Pulse: 84  Height: 6' (1.829 m)  Weight: 244 lb 1.6 oz (110.723 kg)  SpO2: 93%    Body mass index is 33.1 kg/(m^2).           Review of Systems denies chest pain, dyspnea on exertion, PND, orthopnea. Does have GERD. Other systems negative and complete review of systems     Objective:   Physical Exam BP 134/84 mmHg  Pulse 84  Ht 6' (1.829 m)  Wt 244 lb 1.6 oz (110.723 kg)  BMI 33.10 kg/m2  SpO2 93%  Gen.-alert and oriented x3 in no apparent distress HEENT normal for age Lungs no rhonchi or wheezing Cardiovascular regular rhythm no murmurs carotid pulses 3+ palpable no bruits audible Abdomen soft nontender no palpable masses Musculoskeletal free of  major deformities Skin clear -no rashes Neurologic normal Lower extremities 3+ femoral and dorsalis pedis pulses palpable bilaterally with no edema on the  left 1+ edema on the right Hyperpigmentation lower third right leg with evidence of healed ulcer adjacent to right medial malleolus. Skin very friable in this area.  Patient had recent venous reflux exam in our office which I reviewed and interpreted today. There is gross reflux throughout the right great saphenous vein from the mid calf to near the saphenofemoral junction with diameters as large as 0.87 cm. This likely represents a parallel branch from the previous great saphenous vein which was stripped.     Assessment:     Gross reflux right great saphenous vein with recurrent stasis ulcer right angle recently healed after 3 months of treatment. Affecting patient's daily living and ability to work because  of recurrent ulcers and right leg GERD    Plan:     Patient needs laser ablation right great saphenous vein to prevent hopefully further ulcer formation and improved edema and stabilize skin changes We'll proceed with precertification to perform this in the near future

## 2015-08-07 DIAGNOSIS — L97312 Non-pressure chronic ulcer of right ankle with fat layer exposed: Secondary | ICD-10-CM | POA: Diagnosis not present

## 2015-09-10 ENCOUNTER — Other Ambulatory Visit: Payer: Self-pay | Admitting: Family Medicine

## 2015-09-25 ENCOUNTER — Other Ambulatory Visit: Payer: Self-pay | Admitting: *Deleted

## 2015-09-25 DIAGNOSIS — L97319 Non-pressure chronic ulcer of right ankle with unspecified severity: Principal | ICD-10-CM

## 2015-09-25 DIAGNOSIS — I83013 Varicose veins of right lower extremity with ulcer of ankle: Secondary | ICD-10-CM

## 2015-10-16 ENCOUNTER — Encounter: Payer: Self-pay | Admitting: Vascular Surgery

## 2015-10-21 ENCOUNTER — Ambulatory Visit (INDEPENDENT_AMBULATORY_CARE_PROVIDER_SITE_OTHER): Payer: 59 | Admitting: Vascular Surgery

## 2015-10-21 ENCOUNTER — Encounter: Payer: Self-pay | Admitting: Vascular Surgery

## 2015-10-21 ENCOUNTER — Other Ambulatory Visit: Payer: 59 | Admitting: Vascular Surgery

## 2015-10-21 VITALS — BP 164/93 | HR 75 | Temp 98.4°F | Resp 16 | Ht 72.0 in | Wt 235.0 lb

## 2015-10-21 DIAGNOSIS — I83891 Varicose veins of right lower extremities with other complications: Secondary | ICD-10-CM | POA: Insufficient documentation

## 2015-10-21 NOTE — Progress Notes (Signed)
Subjective:     Patient ID: [name] [name], male   DOB: 08/10/1956, 59 y.o.   MRN: [id number]  HPI this 59 year old male had laser ablation of the right great saphenous vein performed under local tumescent anesthesia for gross reflux with a history of stasis ulcers in the right ankle. He tolerated the procedure well. A total of 1900 J of energy was utilized. Review of Systems     Objective:   Physical Exam BP 164/93 mmHg  Pulse 75  Temp(Src) 98.4 F (36.9 C)  Resp 16  Ht 6' (1.829 m)  Wt 235 lb (106.595 kg)  BMI 31.86 kg/m2  SpO2 97%       Assessment:     Well-tolerated laser ablation right great saphenous vein performed under local tumescent anesthesia for gross reflux with a history of stasis ulcers    Plan:     Return in 1 week for venous duplex exam to confirm closure right great saphenous vein

## 2015-10-21 NOTE — Progress Notes (Signed)
Laser Ablation Procedure    Date: 10/21/2015   [name] [name] DOB:12/05/1956  Consent signed: Yes    Surgeon:  Dr. Nelda Severe. Kellie Simmering  Procedure: Laser Ablation: right Greater Saphenous Vein  BP 164/93 mmHg  Pulse 75  Temp(Src) 98.4 F (36.9 C)  Resp 16  Ht 6' (1.829 m)  Wt 235 lb (106.595 kg)  BMI 31.86 kg/m2  SpO2 97%  Tumescent Anesthesia: 300 cc 0.9% NaCl with 50 cc Lidocaine HCL with 1% Epi and 15 cc 8.4% NaHCO3  Local Anesthesia: 3 cc Lidocaine HCL and NaHCO3 (ratio 2:1)  Pulsed Mode: 15 watts, 540ms delay, 1.0 duration  Total Energy:  1901            Total Pulses:  127              Total Time: 2:07    Patient tolerated procedure well  Notes:   Description of Procedure:  After marking the course of the secondary varicosities, the patient was placed on the operating table in the supine position, and the right leg was prepped and draped in sterile fashion.   Local anesthetic was administered and under ultrasound guidance the saphenous vein was accessed with a micro needle and guide wire; then the mirco puncture sheath was placed.  A guide wire was inserted saphenofemoral junction , followed by a 5 french sheath.  The position of the sheath and then the laser fiber below the junction was confirmed using the ultrasound.  Tumescent anesthesia was administered along the course of the saphenous vein using ultrasound guidance. The patient was placed in Trendelenburg position and protective laser glasses were placed on patient and staff, and the laser was fired at 15 watts continuous mode advancing 1-15mm/second for a total of 1901 joules.     Steri strips were applied to the stab wounds and ABD pads and thigh high compression stockings were applied.  Ace wrap bandages were applied over the phlebectomy sites and at the top of the saphenofemoral junction. Blood loss was less than 15 cc.  The patient ambulated out of the operating room having tolerated the procedure well.

## 2015-10-22 ENCOUNTER — Telehealth: Payer: Self-pay | Admitting: *Deleted

## 2015-10-22 NOTE — Telephone Encounter (Signed)
Pt doing well. No problems. Following all instructions. 

## 2015-10-23 ENCOUNTER — Encounter: Payer: Self-pay | Admitting: Internal Medicine

## 2015-10-25 ENCOUNTER — Encounter: Payer: Self-pay | Admitting: Vascular Surgery

## 2015-10-28 ENCOUNTER — Ambulatory Visit (HOSPITAL_COMMUNITY)
Admission: RE | Admit: 2015-10-28 | Discharge: 2015-10-28 | Disposition: A | Discharge status: Home / Self Care | Payer: 59 | Source: Ambulatory Visit | Attending: Vascular Surgery | Admitting: Vascular Surgery

## 2015-10-28 DIAGNOSIS — I83013 Varicose veins of right lower extremity with ulcer of ankle: Secondary | ICD-10-CM | POA: Diagnosis present

## 2015-10-28 DIAGNOSIS — L97319 Non-pressure chronic ulcer of right ankle with unspecified severity: Secondary | ICD-10-CM

## 2015-10-29 ENCOUNTER — Encounter: Payer: Self-pay | Admitting: Vascular Surgery

## 2015-10-29 ENCOUNTER — Ambulatory Visit (INDEPENDENT_AMBULATORY_CARE_PROVIDER_SITE_OTHER): Payer: 59 | Admitting: Vascular Surgery

## 2015-10-29 VITALS — BP 137/86 | HR 87 | Temp 97.7°F | Resp 16 | Ht 72.0 in | Wt 230.0 lb

## 2015-10-29 DIAGNOSIS — I83891 Varicose veins of right lower extremities with other complications: Secondary | ICD-10-CM | POA: Diagnosis not present

## 2015-10-29 NOTE — Progress Notes (Signed)
Subjective:     Patient ID: [name] [name], male   DOB: December 17, 1956, 59 y.o.   MRN: [id number]  HPI this 59 year old male returns 1 week post-laser ablation right great saphenous vein. He had gross reflux in the right great saphenous system and had a history of stasis ulcers in the right ankle. He does not currently have an ulcer. He does have chronic edema below the knee on the right leg. He has one is long-leg elastic compression stocking and taken ibuprofen as instructed.  Past Medical History  Diagnosis Date  . GERD (gastroesophageal reflux disease)   . Varicose veins     Social History  Substance Use Topics  . Smoking status: Never Smoker   . Smokeless tobacco: Not on file  . Alcohol Use: 0.6 oz/week    1 Cans of beer per week    Family History  Problem Relation Age of Onset  . Diabetes Mother   . Hypertension Mother   . Heart disease Mother   . Varicose Veins Mother   . Diabetes Father   . Hypertension Father   . Varicose Veins Father   . Heart attack Father   . Cancer Sister   . Diabetes Sister     No Known Allergies   Current outpatient prescriptions:  .  benzonatate (TESSALON) 100 MG capsule, Take 1-2 capsules (100-200 mg total) by mouth 3 (three) times daily as needed for cough., Disp: 30 capsule, Rfl: 0 .  cetirizine (ZYRTEC) 10 MG tablet, Take 1 tablet (10 mg total) by mouth daily., Disp: 30 tablet, Rfl: 1 .  fish oil-omega-3 fatty acids 1000 MG capsule, Take 2 g by mouth daily.  , Disp: , Rfl:  .  guaiFENesin-dextromethorphan (ROBITUSSIN DM) 100-10 MG/5ML syrup, Take 5 mLs by mouth every 4 (four) hours as needed for cough., Disp: 118 mL, Rfl: 1 .  pantoprazole (PROTONIX) 40 MG tablet, TAKE 1 TABLET BY MOUTH ONCE DAILY, Disp: 90 tablet, Rfl: 3 .  white petrolatum ointment, Apply topically as needed for dry skin., Disp: 30 g, Rfl: 0  Filed Vitals:   10/29/15 1416  BP: 137/86  Pulse: 87  Temp: 97.7 F (36.5 C)  Resp: 16  Height: 6' (1.829 m)  Weight: 230  lb (104.327 kg)  SpO2: 99%    Body mass index is 31.19 kg/(m^2).           Review of Systems has history of gastroesophageal reflux disease. Denies chest pain, dyspnea on exertion, or claudication.     Objective:   Physical Exam BP 137/86 mmHg  Pulse 87  Temp(Src) 97.7 F (36.5 C)  Resp 16  Ht 6' (1.829 m)  Wt 230 lb (104.327 kg)  BMI 31.19 kg/m2  SpO2 99%  Gen. well-developed well-nourished male in no apparent distress alert and when 3 Lungs no rhonchi or wheezing Right leg with mild tenderness to deep palpation of her great saphenous vein. Severe hyperpigmentation medial aspect right ankle with chronic 1+ edema. 3+ dorsalis pedis pulse palpable.  I ordered a venous duplex exam of the right leg which I reviewed and interpreted. There is no DVT. There is total closure of the great saphenous vein up to a point near the saphenofemoral junction.     Assessment:     Successful laser ablation right great saphenous vein for gross reflux with history of stasis ulcers and severe skin changes    Plan:     Recommend wearing short-leg elastic compression stockings on a long-term basis Return to  see me on a when necessary basis

## 2016-01-07 ENCOUNTER — Encounter: Payer: Self-pay | Admitting: Family Medicine

## 2016-01-07 ENCOUNTER — Ambulatory Visit (INDEPENDENT_AMBULATORY_CARE_PROVIDER_SITE_OTHER): Payer: 59 | Admitting: Family Medicine

## 2016-01-07 VITALS — BP 140/80 | HR 96 | Temp 98.3°F | Ht 72.0 in | Wt 247.0 lb

## 2016-01-07 DIAGNOSIS — L918 Other hypertrophic disorders of the skin: Secondary | ICD-10-CM | POA: Diagnosis not present

## 2016-01-07 DIAGNOSIS — K219 Gastro-esophageal reflux disease without esophagitis: Secondary | ICD-10-CM | POA: Diagnosis not present

## 2016-01-07 MED ORDER — PANTOPRAZOLE SODIUM 40 MG PO TBEC: 40.0000 mg | DELAYED_RELEASE_TABLET | Freq: Every day | ORAL | Status: DC

## 2016-01-07 MED FILL — PANTOPRAZOLE SOD DR 40 MG T: 40 | 90 days supply | Qty: 90 | Fill #0

## 2016-01-07 NOTE — Assessment & Plan Note (Signed)
Using 5 seconds cryotherapy, sprayed total of 15 skin tags on Right side of neck, 11 on Left, and 1 on chin.  He elected to defer underarm therapy to next visit.  Tolerated procedures well.

## 2016-01-07 NOTE — Progress Notes (Signed)
Subjective:    [name] [name] is a 60 y.o. male who presents to [facility] today for skin tag removal.  :  1.  Skin tags: Multiple scattered across neck (more on left side), underarms, and chin.  Would like this removed.  Occasionally get caught on shirts and cause him pain.  No redness/heat or other signs of infection.  No drainage from tags  ROS as above.    The following portions of the patient's history were reviewed and updated as appropriate: allergies, current medications, past medical history, family and social history, and problem list. Patient is a nonsmoker.    PMH reviewed.  Past Medical History  Diagnosis Date  . GERD (gastroesophageal reflux disease)   . Varicose veins      Medications reviewed.    Objective:   Physical Exam BP 140/80 mmHg  Pulse 96  Temp(Src) 98.3 F (36.8 C) (Oral)  Ht 6' (1.829 m)  Wt 247 lb (112.038 kg)  BMI 33.49 kg/m2  SpO2 96% Gen:  Alert, cooperative patient who appears stated age in no acute distress.  Vital signs reviewed. Skin:  Numerous skin tabs on Left side of neck.  3 on Right side. Also underarms bilaterally.  1 cm in diameter skin tag on Left side of chin.    No results found for this or any previous visit (from the past 72 hour(s)).

## 2016-01-07 NOTE — Assessment & Plan Note (Signed)
Out of his protonix.  Refilled today.  Controlled with meds.

## 2016-02-17 ENCOUNTER — Encounter: Payer: Self-pay | Admitting: Family Medicine

## 2016-02-17 ENCOUNTER — Ambulatory Visit (INDEPENDENT_AMBULATORY_CARE_PROVIDER_SITE_OTHER): Payer: 59 | Admitting: Family Medicine

## 2016-02-17 VITALS — BP 160/103 | HR 86 | Temp 98.4°F | Wt 246.2 lb

## 2016-02-17 DIAGNOSIS — R030 Elevated blood-pressure reading, without diagnosis of hypertension: Secondary | ICD-10-CM

## 2016-02-17 DIAGNOSIS — IMO0001 Reserved for inherently not codable concepts without codable children: Secondary | ICD-10-CM

## 2016-02-17 MED ORDER — HYDROCHLOROTHIAZIDE 25 MG PO TABS: 25.0000 mg | ORAL_TABLET | Freq: Every day | ORAL | Status: DC

## 2016-02-17 MED FILL — HYDROCHLOROTHIAZIDE 25 MG T: 25 | 90 days supply | Qty: 90 | Fill #0

## 2016-02-17 NOTE — Progress Notes (Signed)
Subjective: [name] [name] is a 60 y.o. male presenting for SDA for high blood pressure.   He reports a history of some high blood pressures in clinic visits in the past but never took medication because he dislikes taking medications. He was at work yesterday, maintenance at [employer], when he felt dizzy for about 5 minutes and spontaneously resolved with sitting. Nurses took his blood pressure and says it read high, though he doesn't know what the reading was.   - ROS: He denies CP, SOB, syncope, HA, vision changes, palpitations; has chronic symmetric leg swelling and varicose veins.  - Non-smoker  Objective: BP 160/103 mmHg  Pulse 86  Temp(Src) 98.4 F (36.9 C) (Oral)  Wt 246 lb 3.2 oz (111.676 kg)  Gen: Pleasant, obese 60 y.o. male in no distress CV: Regular rate, no murmur; radial, DP and PT pulses 2+ bilaterally; 1+ bilateral LE edema and varicosities noted, no JVD, cap refill < 2 sec. Pulm: Non-labored breathing ambient air; CTAB, no wheezes or crackles  Assessment/Plan: [name] [name] is a 60 y.o. male here for HTN.  Elevated blood pressure Elevated BP today, also has history of BP's above goal. No symptoms presently.  - Start hydrochlorothiazide 25mg  - F/u in 2 weeks for BP check - F/u with PCP

## 2016-02-17 NOTE — Patient Instructions (Signed)
Make an appointment for a nurse visit for blood pressure check in about 2 weeks. We will change the dose of the medication if need be at that time.  - Start taking hydrochlorothiazide 25mg  (HCTZ) once a day in the morning or with lunch. It may increase your urine output which you may or may not notice. It might also help with leg swelling.

## 2016-02-18 NOTE — Assessment & Plan Note (Signed)
Elevated BP today, also has history of BP's above goal. No symptoms presently.  - Start hydrochlorothiazide 25mg  - F/u in 2 weeks for BP check - F/u with PCP

## 2016-02-24 ENCOUNTER — Ambulatory Visit (INDEPENDENT_AMBULATORY_CARE_PROVIDER_SITE_OTHER): Payer: 59 | Admitting: *Deleted

## 2016-02-24 VITALS — BP 134/64 | HR 78

## 2016-02-24 DIAGNOSIS — IMO0001 Reserved for inherently not codable concepts without codable children: Secondary | ICD-10-CM

## 2016-02-24 DIAGNOSIS — R030 Elevated blood-pressure reading, without diagnosis of hypertension: Secondary | ICD-10-CM

## 2016-02-24 NOTE — Progress Notes (Signed)
Patient came in for a blood pressure check today. He is taking 25mg  of HCTZ and had it this morning about 5:45am. His pressure was normal today at 134/64.   Patient is due to see his PCP next month for a physical and will follow up on his bp then also.  Jazmin Hartsell,CMA

## 2016-03-24 ENCOUNTER — Ambulatory Visit (INDEPENDENT_AMBULATORY_CARE_PROVIDER_SITE_OTHER): Payer: 59 | Admitting: Family Medicine

## 2016-03-24 ENCOUNTER — Telehealth: Payer: Self-pay | Admitting: Family Medicine

## 2016-03-24 ENCOUNTER — Encounter: Payer: Self-pay | Admitting: Family Medicine

## 2016-03-24 VITALS — BP 130/88 | HR 80 | Temp 97.9°F | Ht 72.0 in | Wt 241.8 lb

## 2016-03-24 DIAGNOSIS — L918 Other hypertrophic disorders of the skin: Secondary | ICD-10-CM

## 2016-03-24 DIAGNOSIS — I10 Essential (primary) hypertension: Secondary | ICD-10-CM

## 2016-03-24 DIAGNOSIS — G4700 Insomnia, unspecified: Secondary | ICD-10-CM | POA: Diagnosis not present

## 2016-03-24 LAB — CBC
HCT: 41.5 % (ref 38.5–50.0)
Hemoglobin: 13.5 g/dL (ref 13.2–17.1)
MCH: 25.6 pg — AB (ref 27.0–33.0)
MCHC: 32.5 g/dL (ref 32.0–36.0)
MCV: 78.6 fL — AB (ref 80.0–100.0)
PLATELETS: 199 10*3/uL (ref 140–400)
RBC: 5.28 MIL/uL (ref 4.20–5.80)
RDW: 15.1 % — ABNORMAL HIGH (ref 11.0–15.0)
WBC: 5 10*3/uL (ref 3.8–10.8)

## 2016-03-24 LAB — LIPID PANEL
Cholesterol: 133 mg/dL (ref 125–200)
HDL: 20 mg/dL — AB (ref >=40)
LDL Cholesterol: 64 mg/dL (ref <130)
Total CHOL/HDL Ratio: 6.7 Ratio — ABNORMAL HIGH (ref <=5.0)
Triglycerides: 246 mg/dL — ABNORMAL HIGH (ref <150)
VLDL: 49 mg/dL — ABNORMAL HIGH (ref <30)

## 2016-03-24 LAB — BASIC METABOLIC PANEL
BUN: 22 mg/dL (ref 7–25)
CALCIUM: 8.9 mg/dL (ref 8.6–10.3)
CO2: 26 mmol/L (ref 20–31)
CREATININE: 0.73 mg/dL (ref 0.70–1.33)
Chloride: 102 mmol/L (ref 98–110)
Glucose, Bld: 96 mg/dL (ref 65–99)
Potassium: 4.2 mmol/L (ref 3.5–5.3)
Sodium: 138 mmol/L (ref 135–146)

## 2016-03-24 LAB — HEPATITIS C ANTIBODY: HCV AB: NEGATIVE

## 2016-03-24 NOTE — Patient Instructions (Signed)
Everything looks good today.  We're checking some labs.    No changes to your blood pressure medications.  Come back to see me when you'd like to work on the other skin tags.  It was good to see you today!

## 2016-03-24 NOTE — Telephone Encounter (Signed)
Patient called back to say he was suppose to get a prescription for sleep.  Did not have a paper rx or nothing was sent over to his pharmacy.  Please send a prescription for medication to [facility].

## 2016-03-24 NOTE — Progress Notes (Signed)
Subjective:    [name] [name] is a 60 y.o. male who presents to [facility] today for several issues:  1. Hypertension:  Recently diagnosed for this patient.  No adverse effects from medication.  Not checking it regularly.  No HA, CP, dizziness, shortness of breath, palpitations, or LE swelling.   BP Readings from Last 3 Encounters:  02/24/16 134/64  02/17/16 160/103  01/07/16 140/80   2.  Skin tags:  Present underarms, face, and neck.  Treated with cryotherapy for this at last visit.  Neck is much better.  Would like to have another treatment. He would like to hold off on his underarms because it is very painful and treated there. States that it did well last timeafter being treated. No itching.  3. Insomnia: Present for the past weeks to months. States he watches TV until about 10-15 minutes for a bed. He goes that it 11 each night. Does not have any other bedtime hygiene/routines. He has not tried anything for relief except for Aleve p.m. He states this usually works but this usually takes about 20 minutes to work. No snoring. No history of sleep apnea. Wife says he does not stop breathing while asleep.  ROS as above per HPI, otherwise neg.    The following portions of the patient's history were reviewed and updated as appropriate: allergies, current medications, past medical history, family and social history, and problem list. Patient is a nonsmoker.    PMH reviewed.  Past Medical History  Diagnosis Date  . GERD (gastroesophageal reflux disease)   . Varicose veins    Past Surgical History  Procedure Laterality Date  . Esophagogastric fundoplication  0000000  . Hernia repair      Medications reviewed. Current Outpatient Prescriptions  Medication Sig Dispense Refill  . cetirizine (ZYRTEC) 10 MG tablet Take 1 tablet (10 mg total) by mouth daily. 30 tablet 1  . fish oil-omega-3 fatty acids 1000 MG capsule Take 2 g by mouth daily.      . hydrochlorothiazide (HYDRODIURIL) 25 MG tablet Take  1 tablet (25 mg total) by mouth daily. 90 tablet 0  . pantoprazole (PROTONIX) 40 MG tablet Take 1 tablet (40 mg total) by mouth daily. 90 tablet 3  . white petrolatum ointment Apply topically as needed for dry skin. 30 g 0   No current facility-administered medications for this visit.     Objective:   Physical Exam Ht 6' (1.829 m)  Wt 241 lb 12.8 oz (109.68 kg)  BMI 32.79 kg/m2 Gen:  Alert, cooperative patient who appears stated age in no acute distress.  Vital signs reviewed. HEENT: EOMI,  MMM Cardiac:  Regular rate and rhythm without murmur auscultated.  Good S1/S2. Pulm:  Clear to auscultation bilaterally with good air movement.  No wheezes or rales noted.   Abd:  Soft/nondistended/nontender.  Good bowel sounds throughout all four quadrants.  No masses noted.  Exts: Non edematous BL  LE, warm and well perfused.  Neuro: No focal deficits noted Skin: 1 cm skin tag noted on left shin. This is smaller than last time.  No results found for this or any previous visit (from the past 72 hour(s)).

## 2016-03-25 MED ORDER — ZOLPIDEM TARTRATE 5 MG PO TABS: 2.5000 mg | ORAL_TABLET | Freq: Every evening | ORAL | Status: DC | PRN

## 2016-03-25 MED FILL — ZOLPIDEM TARTRATE 5 MG TAB: 5 | 30 days supply | Qty: 15 | Fill #0

## 2016-03-25 NOTE — Telephone Encounter (Signed)
Completed and placed in to be faxed box.  

## 2016-03-26 ENCOUNTER — Encounter: Payer: Self-pay | Admitting: Family Medicine

## 2016-03-26 DIAGNOSIS — I10 Essential (primary) hypertension: Secondary | ICD-10-CM | POA: Insufficient documentation

## 2016-03-26 DIAGNOSIS — G4700 Insomnia, unspecified: Secondary | ICD-10-CM

## 2016-03-26 HISTORY — DX: Insomnia, unspecified: G47.00

## 2016-03-26 NOTE — Assessment & Plan Note (Signed)
Cryotherapy treatment x 2 each lasting about 5 seconds applied to skin tag on face. He tolerated this well. Follow-up in 4-6 weeks to assess for improvement or retreatment if needed. He declined surgical treatment for this.

## 2016-03-26 NOTE — Assessment & Plan Note (Signed)
Short-term course of Ambien. We did discuss better sleep hygiene. He will try removing all screens such as phone, computer, television at least one hour before bed.

## 2016-03-26 NOTE — Assessment & Plan Note (Signed)
Doing well with HCTZ.  No complications. Continue current treatment. We did discuss weight loss as treatment for hypertension.

## 2016-04-23 MED FILL — ZOLPIDEM TARTRATE 5 MG TAB: 5 | 30 days supply | Qty: 15 | Fill #1

## 2016-05-08 DIAGNOSIS — H5213 Myopia, bilateral: Secondary | ICD-10-CM | POA: Diagnosis not present

## 2016-05-08 DIAGNOSIS — H1045 Other chronic allergic conjunctivitis: Secondary | ICD-10-CM | POA: Diagnosis not present

## 2016-05-08 DIAGNOSIS — H52223 Regular astigmatism, bilateral: Secondary | ICD-10-CM | POA: Diagnosis not present

## 2016-05-08 DIAGNOSIS — H524 Presbyopia: Secondary | ICD-10-CM | POA: Diagnosis not present

## 2016-05-14 ENCOUNTER — Other Ambulatory Visit: Payer: Self-pay | Admitting: Family Medicine

## 2016-05-14 MED FILL — PANTOPRAZOLE SOD DR 40 MG T: 40 | 90 days supply | Qty: 90 | Fill #1

## 2016-05-19 MED FILL — HYDROCHLOROTHIAZIDE 25 MG T: 25 | 90 days supply | Qty: 90 | Fill #0

## 2016-08-13 MED FILL — HYDROCHLOROTHIAZIDE 25 MG T: 25 | 90 days supply | Qty: 90 | Fill #1

## 2016-10-02 MED FILL — PANTOPRAZOLE SOD DR 40 MG T: 40 | 90 days supply | Qty: 90 | Fill #2

## 2016-11-06 ENCOUNTER — Other Ambulatory Visit: Payer: Self-pay | Admitting: Family Medicine

## 2016-11-06 MED FILL — HYDROCHLOROTHIAZIDE 25 MG T: 25 | 90 days supply | Qty: 90 | Fill #0

## 2017-01-29 MED FILL — HYDROCHLOROTHIAZIDE 25 MG T: 25 | 90 days supply | Qty: 90 | Fill #1

## 2017-02-08 ENCOUNTER — Other Ambulatory Visit: Payer: Self-pay | Admitting: *Deleted

## 2017-02-08 MED ORDER — PANTOPRAZOLE SODIUM 40 MG PO TBEC: 40.0000 mg | DELAYED_RELEASE_TABLET | Freq: Every day | ORAL | 3 refills | Status: DC

## 2017-02-08 MED FILL — PANTOPRAZOLE SOD DR 40 MG T: 40 | 90 days supply | Qty: 90 | Fill #0

## 2017-02-22 ENCOUNTER — Encounter: Payer: Self-pay | Admitting: Emergency Medicine

## 2017-02-22 ENCOUNTER — Emergency Department
Admission: EM | Admit: 2017-02-22 | Discharge: 2017-02-22 | Disposition: A | Discharge status: Home / Self Care | Payer: 59 | Source: Home / Self Care | Attending: Family Medicine | Admitting: Family Medicine

## 2017-02-22 DIAGNOSIS — B9789 Other viral agents as the cause of diseases classified elsewhere: Secondary | ICD-10-CM | POA: Diagnosis not present

## 2017-02-22 DIAGNOSIS — J069 Acute upper respiratory infection, unspecified: Secondary | ICD-10-CM

## 2017-02-22 MED ORDER — GUAIFENESIN ER 600 MG PO TB12: 600.0000 mg | ORAL_TABLET | Freq: Two times a day (BID) | ORAL | 0 refills | Status: DC | PRN

## 2017-02-22 MED ORDER — BENZONATATE 100 MG PO CAPS: 100.0000 mg | ORAL_CAPSULE | Freq: Three times a day (TID) | ORAL | 0 refills | Status: DC

## 2017-02-22 MED ORDER — AZITHROMYCIN 250 MG PO TABS: 250.0000 mg | ORAL_TABLET | Freq: Every day | ORAL | 0 refills | Status: DC

## 2017-02-22 MED ORDER — PREDNISONE 20 MG PO TABS: ORAL_TABLET | ORAL | 0 refills | Status: DC

## 2017-02-22 MED ORDER — IPRATROPIUM BROMIDE 0.06 % NA SOLN
2.0000 | Freq: Four times a day (QID) | NASAL | 0 refills | Status: DC
Start: 2017-02-22 — End: 2017-06-07

## 2017-02-22 MED FILL — BENZONATATE 100 MG CAP: 100 | 4 days supply | Qty: 21 | Fill #0

## 2017-02-22 MED FILL — MUCINEX ER 600 MG TABLET: 600 | 5 days supply | Qty: 20 | Fill #0

## 2017-02-22 MED FILL — IPRATROPIUM 0.06% SPRAY: 0.06 | 30 days supply | Qty: 15 | Fill #0

## 2017-02-22 MED FILL — predniSONE 20 MG TABS: 20 | 5 days supply | Qty: 11 | Fill #0

## 2017-02-22 NOTE — ED Triage Notes (Signed)
Dry cough x 3 days

## 2017-02-22 NOTE — Discharge Instructions (Signed)
°  Your symptoms are likely due to a virus such as the common cold, however, if you developing worsening chest congestion with shortness of breath, persistent fever (>100.4*F) for 3 days, or symptoms not improving in 4-5 days, you may fill the antibiotic (azithromycin).  If you do fill the antibiotic,  please take antibiotics as prescribed and be sure to complete entire course even if you start to feel better to ensure infection does not come back. ° °For the Ipratropium nasal spray, be sure to use as prescribed to help prevent post-nasal drip, which can trigger coughing, especially at night. ° °Use 2 sprays per nostril 4 times daily while sick.  You should spray one time in each nostril pointing the spray to the out portion of your nostril, breath in slowly while spraying. Wait about 30 seconds to 1 minute before given the second spray in each nostril.  This will ensure you get the most benefit from each spray.   ° ° ° °

## 2017-02-22 NOTE — ED Provider Notes (Signed)
CSN: [id number]     Arrival date & time 02/22/17  [id number] History   First MD Initiated Contact with Patient 02/22/17 [phone]     Chief Complaint  Patient presents with  . Cough   (Consider location/radiation/quality/duration/timing/severity/associated sxs/prior Treatment) HPI [name] [name] is a 61 y.o. male presenting to UC with c/o dry intermittent cough for 3 days, worse at night and after eating. Hx of similar cough a few years ago that eventually resolved.  Denies fever, chills, n/v/d. Mild sore throat from cough. He has not tried any OTC medications yet for cough but he notes he has tried alcohol, which helped last time he had a cough.  He does have hx of GERD and does take protonix as prescribed.    Past Medical History:  Diagnosis Date  . GERD (gastroesophageal reflux disease)   . Varicose veins    Past Surgical History:  Procedure Laterality Date  . ESOPHAGOGASTRIC FUNDOPLICATION  0000000  . HERNIA REPAIR     Family History  Problem Relation Age of Onset  . Diabetes Mother   . Hypertension Mother   . Heart disease Mother   . Varicose Veins Mother   . Diabetes Father   . Hypertension Father   . Varicose Veins Father   . Heart attack Father   . Cancer Sister   . Diabetes Sister    Social History  Substance Use Topics  . Smoking status: Never Smoker  . Smokeless tobacco: Never Used  . Alcohol use 0.6 oz/week    1 Cans of beer per week    Review of Systems  Constitutional: Negative for chills and fever.  HENT: Positive for congestion ( minimal) and sore throat ( minimal). Negative for ear pain, trouble swallowing and voice change.   Respiratory: Positive for cough. Negative for shortness of breath.   Cardiovascular: Negative for chest pain and palpitations.  Gastrointestinal: Negative for abdominal pain, diarrhea, nausea and vomiting.  Musculoskeletal: Negative for arthralgias, back pain and myalgias.  Skin: Negative for rash.    Allergies  Patient has no known  allergies.  Home Medications   Prior to Admission medications   Medication Sig Start Date End Date Taking? Authorizing Provider  azithromycin (ZITHROMAX) 250 MG tablet Take 1 tablet (250 mg total) by mouth daily. Take first 2 tablets together, then 1 every day until finished. 02/22/17   Noland Fordyce, PA-C  benzonatate (TESSALON) 100 MG capsule Take 1-2 capsules (100-200 mg total) by mouth every 8 (eight) hours. 02/22/17   Noland Fordyce, PA-C  cetirizine (ZYRTEC) 10 MG tablet Take 1 tablet (10 mg total) by mouth daily. 05/23/15   Rosemarie Ax, MD  fish oil-omega-3 fatty acids 1000 MG capsule Take 2 g by mouth daily.      Historical Provider, MD  guaiFENesin (MUCINEX) 600 MG 12 hr tablet Take 1-2 tablets (600-1,200 mg total) by mouth 2 (two) times daily as needed for cough or to loosen phlegm. 02/22/17   Noland Fordyce, PA-C  hydrochlorothiazide (HYDRODIURIL) 25 MG tablet TAKE 1 TABLET BY MOUTH ONCE DAILY 11/06/16   Alveda Reasons, MD  ipratropium (ATROVENT) 0.06 % nasal spray Place 2 sprays into both nostrils 4 (four) times daily. For 1-2 weeks 02/22/17   Noland Fordyce, PA-C  pantoprazole (PROTONIX) 40 MG tablet Take 1 tablet (40 mg total) by mouth daily. 02/08/17   Alveda Reasons, MD  predniSONE (DELTASONE) 20 MG tablet 3 tabs po day one, then 2 po daily x 4 days 02/22/17  Noland Fordyce, PA-C  white petrolatum ointment Apply topically as needed for dry skin. 11/20/12   Rosana Hoes, MD  zolpidem (AMBIEN) 5 MG tablet Take 0.5 tablets (2.5 mg total) by mouth at bedtime as needed for sleep. 03/25/16   Alveda Reasons, MD   Meds Ordered and Administered this Visit  Medications - No data to display  BP 137/88 (BP Location: Left Arm)   Pulse 94   Temp 98.9 F (37.2 C) (Oral)   Ht 6' (1.829 m)   Wt 248 lb (112.5 kg)   SpO2 97%   BMI 33.63 kg/m  No data found.   Physical Exam  Constitutional: He is oriented to person, place, and time. He appears well-developed and well-nourished. No distress.   HENT:  Head: Normocephalic and atraumatic.  Right Ear: Tympanic membrane normal.  Left Ear: Tympanic membrane normal.  Nose: Nose normal.  Mouth/Throat: Uvula is midline, oropharynx is clear and moist and mucous membranes are normal.  Eyes: EOM are normal.  Neck: Normal range of motion. Neck supple.  Cardiovascular: Normal rate and regular rhythm.   Pulmonary/Chest: Effort normal and breath sounds normal. No stridor. No respiratory distress. He has no wheezes. He has no rales.  Dry intermittent cough on exam. No respiratory distress. No accessory muscle use. Lungs: CTAB  Musculoskeletal: Normal range of motion.  Lymphadenopathy:    He has no cervical adenopathy.  Neurological: He is alert and oriented to person, place, and time.  Skin: Skin is warm and dry. He is not diaphoretic.  Psychiatric: He has a normal mood and affect. His behavior is normal.  Nursing note and vitals reviewed.   Urgent Care Course     Procedures (including critical care time)  Labs Review Labs Reviewed - No data to display  Imaging Review No results found.    MDM   1. Viral URI with cough    No evidence of bacterial infection at this time. Encouraged symptomatic treatment.   Rx: prednisone, tessalon, and mucinex.  Prescription to hold with expiration date for azithromycin. Pt to fill if persistent fever develops or not improving in 1 week.      Noland Fordyce, PA-C 02/22/17 1019

## 2017-04-29 MED FILL — HYDROCHLOROTHIAZIDE 25 MG T: 25 | 90 days supply | Qty: 90 | Fill #2

## 2017-06-07 ENCOUNTER — Encounter: Payer: Self-pay | Admitting: Internal Medicine

## 2017-06-07 ENCOUNTER — Ambulatory Visit (INDEPENDENT_AMBULATORY_CARE_PROVIDER_SITE_OTHER): Payer: 59 | Admitting: Internal Medicine

## 2017-06-07 VITALS — BP 122/78 | HR 68 | Temp 97.9°F | Ht 72.0 in | Wt 244.2 lb

## 2017-06-07 DIAGNOSIS — E785 Hyperlipidemia, unspecified: Secondary | ICD-10-CM | POA: Diagnosis not present

## 2017-06-07 DIAGNOSIS — H52223 Regular astigmatism, bilateral: Secondary | ICD-10-CM | POA: Diagnosis not present

## 2017-06-07 DIAGNOSIS — H524 Presbyopia: Secondary | ICD-10-CM | POA: Diagnosis not present

## 2017-06-07 DIAGNOSIS — R482 Apraxia: Secondary | ICD-10-CM

## 2017-06-07 DIAGNOSIS — L918 Other hypertrophic disorders of the skin: Secondary | ICD-10-CM

## 2017-06-07 DIAGNOSIS — E669 Obesity, unspecified: Secondary | ICD-10-CM | POA: Diagnosis not present

## 2017-06-07 DIAGNOSIS — I10 Essential (primary) hypertension: Secondary | ICD-10-CM

## 2017-06-07 DIAGNOSIS — H5213 Myopia, bilateral: Secondary | ICD-10-CM | POA: Diagnosis not present

## 2017-06-07 HISTORY — DX: Apraxia: R48.2

## 2017-06-07 NOTE — Assessment & Plan Note (Signed)
Multiple skin tags present. Have recommended patient schedule an appointment for removal.

## 2017-06-07 NOTE — Progress Notes (Signed)
Subjective:    [name] [name] - 61 y.o. male MRN [id number]  Date of birth: July 20, 1956  HPI  [name] [name] is here for annual physical exam.  Right Eyelid Not Opening: Reports that for the past 1-2 months when he sleeps for long periods of time he will have trouble opening his right eyelid. He will rub the eyelid and it will open. No drainage or matting in the morning or throughout the day. This never occurs when he is awake. No pain or edema present. No twitching of the eyelid. No visual changes. He has an appointment with his eye doctor later today.   HTN: Compliant with HCTZ. No AEs from medication. Not monitoring BP regularly. No chest pain, SOB, LE edema, headaches or visual changes.   Skin Tags: Present mostly under the right arm. Has a few present on his neck as well. Would like to have these removed in the near future.   Obesity: Patient reports that he desires to lose weight. He admits that he does not exercise routinely. States the most exercise he does is when he mows his lawn but says he uses a riding [employer]. He drinks sweet tea at lunch and his favorite drink at home is Gatorade. He eats breakfast as [facility] cafeteria and typically eats items like sausage and biscuits with gravy.    Health Maintenance:  There are no preventive care reminders to display for this patient.  -  reports that he has never smoked. He has never used smokeless tobacco. - Review of Systems: Per HPI. - Past Medical History: Patient Active Problem List   Diagnosis Date Noted  . Apraxia of eyelid opening 06/07/2017  . HTN (hypertension) 03/26/2016  . Insomnia 03/26/2016  . Varicose veins of right lower extremities with other complications 37/16/9678  . Varicose veins of leg with complications 93/81/0175  . Cough 05/24/2015  . Restless leg syndrome 03/12/2015  . Skin tag 03/12/2015  . Preventative health care 03/07/2014  . Obesity (BMI 30-39.9) 11/01/2013  . HLD (hyperlipidemia) 03/13/2013  .  GERD (gastroesophageal reflux disease) 03/13/2013   - Medications: reviewed and updated   Objective:   Physical Exam BP 122/78   Pulse 68   Temp 97.9 F (36.6 C) (Oral)   Ht 6' (1.829 m)   Wt 244 lb 3.2 oz (110.8 kg)   SpO2 93%   BMI 33.12 kg/m  Gen: NAD, alert, cooperative with exam, well-appearing HEENT: NCAT, PERRL, clear conjunctiva, no TTP of right orbital region, no edema of right eyelids, oropharynx clear, supple neck CV: RRR, good S1/S2, no murmur, no edema, capillary refill brisk  Resp: CTABL, no wheezes, non-labored Abd: SNTND, BS present, no guarding or organomegaly Skin: many skin tags present underneath right arm and a few scattered on the neck  Neuro: CN II-XII grossly intact. No facial or eyelid droop present.  Psych: good insight, alert and oriented     Assessment & Plan:   HTN (hypertension) Well controlled with HCTZ. Continue current treatment. Will check BMET today.   HLD (hyperlipidemia) Checking lipid panel today. Patient has been at goal LDL on previous checks without statin therapy. HDL noted to be quite low in 2017. Patient is taking Fish Oil.   Skin tag Multiple skin tags present. Have recommended patient schedule an appointment for removal.   Obesity (BMI 30-39.9) Have discussed diet modifications such as changing breakfast choices a few times per week, drinking un-sweet tea, and switching to sugar free Gatorade. Have also discussed  starting a walking program 3-4 days per week.   Apraxia of eyelid opening Of right eyelid. Unknown etiology. May be related to temporary palsy of nerve from sleep position. No evidence of inflammatory condition on exam. History does not sound consistent with matting from allergic conjunctivitis. Is not occurring throughout the day and patient has no neurological deficits on exam. Have recommended discussing with eye doctor. Follow up with PCP if symptoms worsen or develops new symptoms such as eyelid twitching during the  day, drainage, etc.     Phill Myron, D.O. 06/07/2017, 9:34 AM PGY-2, [place]

## 2017-06-07 NOTE — Assessment & Plan Note (Signed)
Have discussed diet modifications such as changing breakfast choices a few times per week, drinking un-sweet tea, and switching to sugar free Gatorade. Have also discussed starting a walking program 3-4 days per week.

## 2017-06-07 NOTE — Assessment & Plan Note (Signed)
Of right eyelid. Unknown etiology. May be related to temporary palsy of nerve from sleep position. No evidence of inflammatory condition on exam. History does not sound consistent with matting from allergic conjunctivitis. Is not occurring throughout the day and patient has no neurological deficits on exam. Have recommended discussing with eye doctor. Follow up with PCP if symptoms worsen or develops new symptoms such as eyelid twitching during the day, drainage, etc.

## 2017-06-07 NOTE — Assessment & Plan Note (Signed)
Checking lipid panel today. Patient has been at goal LDL on previous checks without statin therapy. HDL noted to be quite low in 2017. Patient is taking Fish Oil.

## 2017-06-07 NOTE — Assessment & Plan Note (Signed)
Well controlled with HCTZ. Continue current treatment. Will check BMET today.

## 2017-06-07 NOTE — Patient Instructions (Addendum)
We will contact you with your lab results.   Consider making small changes in your diet such as switching to sugar free or low sugar Gatorade, changing your breakfast a few times a week, etc. Walking a small amount a few times a week can also make a big impact.   Schedule an appointment to have your skin tags removed.   Ask the eye doctor about the right eyelid. If the problem worsens please follow up with Dr. Mingo Amber.   Follow up with Dr. Mingo Amber in one year for your next check up.

## 2017-06-08 LAB — LIPID PANEL
CHOL/HDL RATIO: 5.8 ratio — AB (ref 0.0–5.0)
Cholesterol, Total: 138 mg/dL (ref 100–199)
HDL: 24 mg/dL — ABNORMAL LOW (ref >39)
LDL CALC: 57 mg/dL (ref 0–99)
TRIGLYCERIDES: 284 mg/dL — AB (ref 0–149)
VLDL CHOLESTEROL CAL: 57 mg/dL — AB (ref 5–40)

## 2017-06-08 LAB — BASIC METABOLIC PANEL
BUN/Creatinine Ratio: 27 — ABNORMAL HIGH (ref 10–24)
BUN: 18 mg/dL (ref 8–27)
CALCIUM: 9.1 mg/dL (ref 8.6–10.2)
CO2: 24 mmol/L (ref 20–29)
CREATININE: 0.67 mg/dL — AB (ref 0.76–1.27)
Chloride: 100 mmol/L (ref 96–106)
GFR, EST AFRICAN AMERICAN: 121 mL/min/{1.73_m2} (ref >59)
GFR, EST NON AFRICAN AMERICAN: 104 mL/min/{1.73_m2} (ref >59)
Glucose: 96 mg/dL (ref 65–99)
Potassium: 4.2 mmol/L (ref 3.5–5.2)
Sodium: 140 mmol/L (ref 134–144)

## 2017-06-11 ENCOUNTER — Encounter: Payer: Self-pay | Admitting: Internal Medicine

## 2017-06-16 ENCOUNTER — Telehealth: Payer: Self-pay | Admitting: Internal Medicine

## 2017-06-16 ENCOUNTER — Encounter (HOSPITAL_COMMUNITY): Payer: Self-pay | Admitting: Internal Medicine

## 2017-06-16 NOTE — Telephone Encounter (Signed)
Have sent lab results of BMET and lipid panel to patient via mail.   Sent message to patient via MyChart recommending that he follow up with PCP regarding results. Noted that total cholesterol elevated and HDL quite low. As I did at office visit, encouraged start of regular exercise program and low fat diet.   ASCVD 10 year risk calculated to be 20.9%. Patient is not currently on statin therapy or ASA. Have recommended to patient that he discuss starting one or both of these therapies with PCP, Dr. Mingo Amber.   Phill Myron, D.O. 06/16/2017, 10:26 AM PGY-2, [place]

## 2017-06-21 ENCOUNTER — Ambulatory Visit (INDEPENDENT_AMBULATORY_CARE_PROVIDER_SITE_OTHER): Payer: 59 | Admitting: Internal Medicine

## 2017-06-21 ENCOUNTER — Encounter: Payer: Self-pay | Admitting: Internal Medicine

## 2017-06-21 VITALS — BP 118/86 | HR 77 | Temp 98.1°F | Ht 72.0 in | Wt 242.8 lb

## 2017-06-21 DIAGNOSIS — L918 Other hypertrophic disorders of the skin: Secondary | ICD-10-CM

## 2017-06-21 MED ORDER — HYDROXYZINE HCL 25 MG PO TABS: ORAL_TABLET | ORAL | 0 refills | Status: DC

## 2017-06-21 MED FILL — PANTOPRAZOLE SOD DR 40 MG T: 40 | 90 days supply | Qty: 90 | Fill #1

## 2017-06-21 MED FILL — hydrOXYzine HCL 25 MG TABS: 25 | 20 days supply | Qty: 20 | Fill #0

## 2017-06-21 NOTE — Addendum Note (Signed)
Addended byMingo Amber, Trenyce Loera H on: 06/21/2017 10:03 AM   Modules accepted: Orders

## 2017-06-21 NOTE — Progress Notes (Signed)
[name] [name] had expressed to me when I met him in the hospital a week or two ago that he is having claustrophobia related to going into small spaced at work.  This occurs 1-2 times a month, depending on when he is asked to do this by work.  He would like something to take to prevent his claustrophobic symptoms.  No physical symptoms, just feelings of fear and being trapped in a small space that resolve as soon as he leaves the space. Will try Atarax.  He is to message me through Mychart to see how this is working for him.

## 2017-06-21 NOTE — Progress Notes (Signed)
[name] [name] is presenting for skin tag removal. However, clinic is out of liquid nitrogen supply so unable to perform cryotherapy. Will call patient to re-schedule when supply is re-stocked. Will no charge for today.

## 2017-06-30 ENCOUNTER — Encounter: Payer: 59 | Attending: Physician Assistant | Admitting: Physician Assistant

## 2017-06-30 DIAGNOSIS — E785 Hyperlipidemia, unspecified: Secondary | ICD-10-CM | POA: Insufficient documentation

## 2017-06-30 DIAGNOSIS — L97312 Non-pressure chronic ulcer of right ankle with fat layer exposed: Secondary | ICD-10-CM | POA: Insufficient documentation

## 2017-06-30 DIAGNOSIS — E669 Obesity, unspecified: Secondary | ICD-10-CM | POA: Diagnosis not present

## 2017-06-30 DIAGNOSIS — I10 Essential (primary) hypertension: Secondary | ICD-10-CM | POA: Insufficient documentation

## 2017-06-30 DIAGNOSIS — I872 Venous insufficiency (chronic) (peripheral): Secondary | ICD-10-CM | POA: Diagnosis not present

## 2017-06-30 DIAGNOSIS — Z6833 Body mass index (BMI) 33.0-33.9, adult: Secondary | ICD-10-CM | POA: Diagnosis not present

## 2017-07-02 NOTE — Progress Notes (Signed)
Zeleznik, Cristian L. ([id number]) Visit Report for 06/30/2017 Allergy List Details Patient Name: [name] [name], [name] [name]. Date of Service: 06/30/2017 8:00 AM Medical Record Number: [id number] Patient Account Number: [id number] Date of Birth/Sex: [birth year] (61 y.o. Male) Treating RN: Montey Hora Primary Care Kimyatta Lecy: Esmond Camper Other Clinician: Referring Anahlia Iseminger: Mingo Amber, JEFFREY Treating Raedyn Klinck/Extender: STONE III, HOYT Weeks in Treatment: 0 Allergies Active Allergies No Known Drug Allergies Allergy Notes Electronic Signature(s) Signed: 06/30/2017 5:40:02 PM By: Montey Hora Entered By: Montey Hora on 06/30/2017 08:13:32 Heinlein, Menashe L. ([id number]) -------------------------------------------------------------------------------- Arrival Information Details Patient Name: [name] [name], [name] [name]. Date of Service: 06/30/2017 8:00 AM Medical Record Number: [id number] Patient Account Number: [id number] Date of Birth/Sex: August 01, 1956 (61 y.o. Male) Treating RN: Montey Hora Primary Care Baylei Siebels: Esmond Camper Other Clinician: Referring Sofie Schendel: Esmond Camper Treating Dottie Vaquerano/Extender: Melburn Hake, HOYT Weeks in Treatment: 0 Visit Information Patient Arrived: Ambulatory Arrival Time: 08:11 Accompanied By: self Transfer Assistance: None Patient Identification Verified: Yes Secondary Verification Process Yes Completed: Electronic Signature(s) Signed: 06/30/2017 5:40:02 PM By: Montey Hora Entered By: Montey Hora on 06/30/2017 08:11:56 Albers, Kazimierz L. ([id number]) -------------------------------------------------------------------------------- Clinic Level of Care Assessment Details Patient Name: [name] [name], [name] [name]. Date of Service: 06/30/2017 8:00 AM Medical Record Number: [id number] Patient Account Number: [id number] Date of Birth/Sex: 1956-11-14 (61 y.o. Male) Treating RN: Montey Hora Primary Care Kairie Vangieson: Esmond Camper Other Clinician: Referring Javona Bergevin:  Mingo Amber, JEFFREY Treating Kween Bacorn/Extender: STONE III, HOYT Weeks in Treatment: 0 Clinic Level of Care Assessment Items TOOL 1 Quantity Score []  - Use when EandM and Procedure is performed on INITIAL visit 0 ASSESSMENTS - Nursing Assessment / Reassessment X - General Physical Exam (combine w/ comprehensive assessment (listed just 1 20 below) when performed on new pt. evals) X - Comprehensive Assessment (HX, ROS, Risk Assessments, Wounds Hx, etc.) 1 25 ASSESSMENTS - Wound and Skin Assessment / Reassessment []  - Dermatologic / Skin Assessment (not related to wound area) 0 ASSESSMENTS - Ostomy and/or Continence Assessment and Care []  - Incontinence Assessment and Management 0 []  - Ostomy Care Assessment and Management (repouching, etc.) 0 PROCESS - Coordination of Care X - Simple Patient / Family Education for ongoing care 1 15 []  - Complex (extensive) Patient / Family Education for ongoing care 0 X - Staff obtains Programmer, systems, Records, Test Results / Process Orders 1 10 []  - Staff telephones HHA, Nursing Homes / Clarify orders / etc 0 []  - Routine Transfer to another Facility (non-emergent condition) 0 []  - Routine Hospital Admission (non-emergent condition) 0 X - New Admissions / Biomedical engineer / Ordering NPWT, Apligraf, etc. 1 15 []  - Emergency Hospital Admission (emergent condition) 0 PROCESS - Special Needs []  - Pediatric / Minor Patient Management 0 []  - Isolation Patient Management 0 Eddington, Sinai L. ([id number]) []  - Hearing / Language / Visual special needs 0 []  - Assessment of Community assistance (transportation, D/C planning, etc.) 0 []  - Additional assistance / Altered mentation 0 []  - Support Surface(s) Assessment (bed, cushion, seat, etc.) 0 INTERVENTIONS - Miscellaneous []  - External ear exam 0 []  - Patient Transfer (multiple staff / Civil Service fast streamer / Similar devices) 0 []  - Simple Staple / Suture removal (25 or less) 0 []  - Complex Staple / Suture removal (26 or  more) 0 []  - Hypo/Hyperglycemic Management (do not check if billed separately) 0 X - Ankle / Brachial Index (ABI) - do not check if billed separately 1 15 Has the patient been seen at the hospital within the last three years: Yes Total Score: 100 Level Of Care:  New/Established - Level 3 Electronic Signature(s) Signed: 06/30/2017 5:40:02 PM By: Montey Hora Entered By: Montey Hora on 06/30/2017 09:28:21 Spindle, [place] ([id number]) -------------------------------------------------------------------------------- Compression Therapy Details Patient Name: [name] [name], [name] Date of Service: 06/30/2017 8:00 AM Medical Record Number: [id number] Patient Account Number: [id number] Date of Birth/Sex: 1956/07/24 (62 y.o. Male) Treating RN: Montey Hora Primary Care Anneli Bing: Esmond Camper Other Clinician: Referring Raffaele Derise: Esmond Camper Treating Therese Rocco/Extender: STONE III, HOYT Weeks in Treatment: 0 Compression Therapy Performed for Wound Wound #1 Right,Medial Malleolus Assessment: Performed By: Clinician Montey Hora, RN Compression Type: Four Layer Pre Treatment ABI: 1.2 Post Procedure Diagnosis Same as Pre-procedure Electronic Signature(s) Signed: 06/30/2017 9:27:30 AM By: Montey Hora Entered By: Montey Hora on 06/30/2017 09:27:29 Barnick, Aydrien L. ([id number]) -------------------------------------------------------------------------------- Encounter Discharge Information Details Patient Name: [name] [name], [name] [name]. Date of Service: 06/30/2017 8:00 AM Medical Record Number: [id number] Patient Account Number: [id number] Date of Birth/Sex: 1956-08-20 (61 y.o. Male) Treating RN: Montey Hora Primary Care Alfredo Collymore: Esmond Camper Other Clinician: Referring Clent Damore: Esmond Camper Treating Loring Liskey/Extender: Melburn Hake, HOYT Weeks in Treatment: 0 Encounter Discharge Information Items Discharge Pain Level: 0 Discharge Condition: Stable Ambulatory Status:  Ambulatory Discharge Destination: Home Transportation: Private Auto Accompanied By: self Schedule Follow-up Appointment: Yes Medication Reconciliation completed and provided to Patient/Care No Lunna Vogelgesang: Provided on Clinical Summary of Care: 06/30/2017 Form Type Recipient Paper Patient RM Electronic Signature(s) Signed: 06/30/2017 9:15:17 AM By: Ruthine Dose Entered By: Ruthine Dose on 06/30/2017 09:15:17 Giusto, Davide L. ([id number]) -------------------------------------------------------------------------------- Lower Extremity Assessment Details Patient Name: [name] [name], [name] [name]. Date of Service: 06/30/2017 8:00 AM Medical Record Number: [id number] Patient Account Number: [id number] Date of Birth/Sex: Jul 22, 1956 (61 y.o. Male) Treating RN: Montey Hora Primary Care Mettie Roylance: Esmond Camper Other Clinician: Referring Demari Kropp: Mingo Amber, JEFFREY Treating Challis Crill/Extender: STONE III, HOYT Weeks in Treatment: 0 Edema Assessment Assessed: [Left: No] [Right: No] Edema: [Left: Ye] [Right: s] Calf Left: Right: Point of Measurement: 36 cm From Medial Instep cm 41.6 cm Ankle Left: Right: Point of Measurement: 12 cm From Medial Instep cm 26.2 cm Vascular Assessment Pulses: Dorsalis Pedis Palpable: [Right:Yes] Doppler Audible: [Right:Yes] Posterior Tibial Palpable: [Right:No] Doppler Audible: [Right:Yes] Extremity colors, hair growth, and conditions: Extremity Color: [Right:Normal] Hair Growth on Extremity: [Right:Yes] Temperature of Extremity: [Right:Warm] Capillary Refill: [Right:< 3 seconds] Blood Pressure: Brachial: [Right:148] Dorsalis Pedis: [Left:Dorsalis Pedis: 182] Ankle: Posterior Tibial: [Left:Posterior Tibial: 178] [Right:1.23] Toe Nail Assessment Left: Right: Thick: Yes Discolored: Yes Deformed: No Improper Length and Hygiene: No Beechy, Noe L. ([id number]) Electronic Signature(s) Signed: 06/30/2017 5:40:02 PM By: Montey Hora Entered By: Montey Hora on 06/30/2017 08:41:48 Santibanez, Caydan L. ([id number]) -------------------------------------------------------------------------------- Multi Wound Chart Details Patient Name: [name] [name], [name] Date of Service: 06/30/2017 8:00 AM Medical Record Number: [id number] Patient Account Number: [id number] Date of Birth/Sex: 11-03-1956 (60 y.o. Male) Treating RN: Montey Hora Primary Care Jamear Carbonneau: Esmond Camper Other Clinician: Referring Armani Gawlik: Mingo Amber, JEFFREY Treating Sabrinia Prien/Extender: STONE III, HOYT Weeks in Treatment: 0 Vital Signs Height(in): 72 Pulse(bpm): 74 Weight(lbs): 245 Blood Pressure 153/88 (mmHg): Body Mass Index(BMI): 33 Temperature(F): 98.1 Respiratory Rate 18 (breaths/min): Photos: [1:No Photos] [N/A:N/A] Wound Location: [1:Right Malleolus - Medial] [N/A:N/A] Wounding Event: [1:Gradually Appeared] [N/A:N/A] Primary Etiology: [1:Venous Leg Ulcer] [N/A:N/A] Comorbid History: [1:Hypertension] [N/A:N/A] Date Acquired: [1:06/15/2017] [N/A:N/A] Weeks of Treatment: [1:0] [N/A:N/A] Wound Status: [1:Open] [N/A:N/A] Measurements L x W x D 44.3x3.2x0.1 [N/A:N/A] (cm) Area (cm) : [1:111.338] [N/A:N/A] Volume (cm) : [1:11.134] [N/A:N/A] Classification: [1:Full Thickness Without Exposed Support Structures] [N/A:N/A] Exudate Amount: [1:Large] [N/A:N/A] Exudate Type: [1:Serous] [N/A:N/A] Exudate Color: [1:amber] [N/A:N/A]  Wound Margin: [1:Flat and Intact] [N/A:N/A] Granulation Amount: [1:Small (1-33%)] [N/A:N/A] Granulation Quality: [1:Pink] [N/A:N/A] Necrotic Amount: [1:Large (67-100%)] [N/A:N/A] Exposed Structures: [1:Fascia: No Fat Layer (Subcutaneous Tissue) Exposed: No Tendon: No Muscle: No Joint: No Bone: No] [N/A:N/A] Epithelialization: [1:None] [N/A:N/A] Periwound Skin Texture: Excoriation: No N/A N/A Induration: No Callus: No Crepitus: No Rash: No Scarring: No Periwound Skin Maceration: No N/A N/A Moisture: Dry/Scaly: No Periwound Skin Color:  Ecchymosis: Yes N/A N/A Atrophie Blanche: No Cyanosis: No Erythema: No Hemosiderin Staining: No Mottled: No Pallor: No Rubor: No Temperature: No Abnormality N/A N/A Tenderness on Yes N/A N/A Palpation: Wound Preparation: Ulcer Cleansing: N/A N/A Rinsed/Irrigated with Saline Topical Anesthetic Applied: Other: lidocaine 4% Treatment Notes Electronic Signature(s) Signed: 06/30/2017 5:40:02 PM By: Montey Hora Entered By: Montey Hora on 06/30/2017 08:51:33 Longnecker, Hartley L. ([id number]) -------------------------------------------------------------------------------- [place] Details Patient Name: [name] [name], [name] [name]. Date of Service: 06/30/2017 8:00 AM Medical Record Number: [id number] Patient Account Number: [id number] Date of Birth/Sex: 02-01-1956 (61 y.o. Male) Treating RN: Montey Hora Primary Care Tayvin Preslar: Esmond Camper Other Clinician: Referring Shareeka Yim: Esmond Camper Treating Keiran Gaffey/Extender: Melburn Hake, HOYT Weeks in Treatment: 0 Active Inactive ` Abuse / Safety / Falls / Self Care Management Nursing Diagnoses: Potential for falls Goals: Patient will remain injury free related to falls Date Initiated: 06/30/2017 Target Resolution Date: 08/20/2017 Goal Status: Active Interventions: Assess fall risk on admission and as needed Notes: ` Orientation to the Wound Care Program Nursing Diagnoses: Knowledge deficit related to the wound healing center program Goals: Patient/caregiver will verbalize understanding of the [place] Date Initiated: 06/30/2017 Target Resolution Date: 08/20/2017 Goal Status: Active Interventions: Provide education on orientation to the wound center Notes: ` Wound/Skin Impairment Nursing Diagnoses: Impaired tissue integrity Biss, Jamile L. ([id number]) Goals: Ulcer/skin breakdown will have a volume reduction of 30% by week 4 Date Initiated: 06/30/2017 Target Resolution Date:  08/20/2017 Goal Status: Active Ulcer/skin breakdown will have a volume reduction of 50% by week 8 Date Initiated: 06/30/2017 Target Resolution Date: 08/20/2017 Goal Status: Active Ulcer/skin breakdown will have a volume reduction of 80% by week 12 Date Initiated: 06/30/2017 Target Resolution Date: 08/20/2017 Goal Status: Active Ulcer/skin breakdown will heal within 14 weeks Date Initiated: 06/30/2017 Target Resolution Date: 08/20/2017 Goal Status: Active Interventions: Assess patient/caregiver ability to obtain necessary supplies Assess patient/caregiver ability to perform ulcer/skin care regimen upon admission and as needed Assess ulceration(s) every visit Notes: Electronic Signature(s) Signed: 06/30/2017 5:40:02 PM By: Montey Hora Entered By: Montey Hora on 06/30/2017 08:50:05 Friedmann, Delmus L. ([id number]) -------------------------------------------------------------------------------- Pain Assessment Details Patient Name: [name] [name], [name] [name]. Date of Service: 06/30/2017 8:00 AM Medical Record Number: [id number] Patient Account Number: [id number] Date of Birth/Sex: 08/05/1956 (61 y.o. Male) Treating RN: Montey Hora Primary Care Zaquan Duffner: Esmond Camper Other Clinician: Referring [place] Souter: Mingo Amber, JEFFREY Treating Patrese Neal/Extender: STONE III, HOYT Weeks in Treatment: 0 Active Problems Location of Pain Severity and Description of Pain Patient Has Paino No Site Locations Pain Management and Medication Current Pain Management: Notes Topical or injectable lidocaine is offered to patient for acute pain when surgical debridement is performed. If needed, Patient is instructed to use over the counter pain medication for the following 24-48 hours after debridement. Wound care MDs do not prescribed pain medications. Patient has chronic pain or uncontrolled pain. Patient has been instructed to make an appointment with their Primary Care Physician for pain management. Electronic  Signature(s) Signed: 06/30/2017 5:40:02 PM By: Montey Hora Entered By: Montey Hora on 06/30/2017 08:12:04 [name] [name], [name] [name] ([id number]) -------------------------------------------------------------------------------- Patient/Caregiver  Education Details Patient Name: [name] [name], [name] [name]. Date of Service: 06/30/2017 8:00 AM Medical Record Number: [id number] Patient Account Number: [id number] Date of Birth/Gender: 1956-12-19 (61 y.o. Male) Treating RN: Montey Hora Primary Care Physician: Esmond Camper Other Clinician: Referring Physician: Esmond Camper Treating Physician/Extender: Sharalyn Ink in Treatment: 0 Education Assessment Education Provided To: Patient Education Topics Provided Venous: Handouts: Other: need for compression daily Methods: Explain/Verbal Responses: State content correctly Electronic Signature(s) Signed: 06/30/2017 5:40:02 PM By: Montey Hora Entered By: Montey Hora on 06/30/2017 08:52:28 Kocian, Deontre L. ([id number]) -------------------------------------------------------------------------------- Wound Assessment Details Patient Name: [name] [name], [name] Date of Service: 06/30/2017 8:00 AM Medical Record Number: [id number] Patient Account Number: [id number] Date of Birth/Sex: 09-Jan-1956 (61 y.o. Male) Treating RN: Montey Hora Primary Care Gregori Abril: Esmond Camper Other Clinician: Referring Lilac Hoff: Mingo Amber, JEFFREY Treating Jonita Hirota/Extender: STONE III, HOYT Weeks in Treatment: 0 Wound Status Wound Number: 1 Primary Etiology: Venous Leg Ulcer Wound Location: Right, Medial Malleolus Wound Status: Open Wounding Event: Gradually Appeared Comorbid History: Hypertension Date Acquired: 06/15/2017 Weeks Of Treatment: 0 Clustered Wound: No Photos Photo Uploaded By: Montey Hora on 06/30/2017 09:38:36 Wound Measurements Length: (cm) 4.3 Width: (cm) 3.2 Depth: (cm) 0.1 Area: (cm) 10.807 Volume: (cm) 1.081 % Reduction in  Area: 90.3% % Reduction in Volume: 90.3% Epithelialization: None Tunneling: No Undermining: No Wound Description Full Thickness Without Exposed Foul Odor After Classification: Support Structures Slough/Fibrino Wound Margin: Flat and Intact Exudate Large Amount: Exudate Type: Serous Exudate Color: amber Cleansing: No Yes Wound Bed Granulation Amount: Small (1-33%) Exposed Structure Granulation Quality: Pink Fascia Exposed: No Necrotic Amount: Large (67-100%) Fat Layer (Subcutaneous Tissue) Exposed: No Seivert, Demarious L. ([id number]) Necrotic Quality: Adherent Slough Tendon Exposed: No Muscle Exposed: No Joint Exposed: No Bone Exposed: No Periwound Skin Texture Texture Color No Abnormalities Noted: No No Abnormalities Noted: No Callus: No Atrophie Blanche: No Crepitus: No Cyanosis: No Excoriation: No Ecchymosis: Yes Induration: No Erythema: No Rash: No Hemosiderin Staining: No Scarring: No Mottled: No Pallor: No Moisture Rubor: No No Abnormalities Noted: No Dry / Scaly: No Temperature / Pain Maceration: No Temperature: No Abnormality Tenderness on Palpation: Yes Wound Preparation Ulcer Cleansing: Rinsed/Irrigated with Saline Topical Anesthetic Applied: Other: lidocaine 4%, Treatment Notes Wound #1 (Right, Medial Malleolus) 1. Cleansed with: Clean wound with Normal Saline 2. Anesthetic Topical Lidocaine 4% cream to wound bed prior to debridement 4. Dressing Applied: Aquacel Ag Other dressing (specify in notes) 7. Secured with 4-Layer Compression System - Right Lower Extremity Notes xtrasorb Electronic Signature(s) Signed: 06/30/2017 5:40:02 PM By: Montey Hora Entered By: Montey Hora on 06/30/2017 16:34:02 Newsome, Clemens L. ([id number]) -------------------------------------------------------------------------------- Vitals Details Patient Name: KAIMANA, LURZ L. Date of Service: 06/30/2017 8:00 AM Medical Record Number: [id number] Patient  Account Number: [id number] Date of Birth/Sex: July 02, 1956 (61 y.o. Male) Treating RN: Montey Hora Primary Care Namira Rosekrans: Esmond Camper Other Clinician: Referring Taquita Demby: Mingo Amber, JEFFREY Treating Katrina Brosh/Extender: STONE III, HOYT Weeks in Treatment: 0 Vital Signs Time Taken: 08:12 Temperature (F): 98.1 Height (in): 72 Pulse (bpm): 74 Source: Measured Respiratory Rate (breaths/min): 18 Weight (lbs): 245 Blood Pressure (mmHg): 153/88 Source: Measured Reference Range: 80 - 120 mg / dl Body Mass Index (BMI): 33.2 Electronic Signature(s) Signed: 06/30/2017 5:40:02 PM By: Montey Hora Entered By: Montey Hora on 06/30/2017 08:20:39

## 2017-07-02 NOTE — Progress Notes (Signed)
Caroll, Taite L. ([id number]) Visit Report for 06/30/2017 Chief Complaint Document Details Patient Name: [name] [name], [name] [name]. Date of Service: 06/30/2017 8:00 AM Medical Record Number: [id number] Patient Account Number: [id number] Date of Birth/Sex: 10/23/1956 (61 y.o. Male) Treating RN: Montey Hora Primary Care Provider: Esmond Camper Other Clinician: Referring Provider: Esmond Camper Treating Provider/Extender: Melburn Hake, HOYT Weeks in Treatment: 0 Information Obtained from: Patient Chief Complaint Right Medial Ankle Ulcer Electronic Signature(s) Signed: 07/01/2017 10:10:39 AM By: Worthy Keeler PA-C Entered By: Worthy Keeler on 06/30/2017 09:05:49 Hilton, Hason L. ([id number]) -------------------------------------------------------------------------------- HPI Details Patient Name: [name], [name] Date of Service: 06/30/2017 8:00 AM Medical Record Number: [id number] Patient Account Number: [id number] Date of Birth/Sex: 09-04-1956 (61 y.o. Male) Treating RN: Montey Hora Primary Care Provider: Esmond Camper Other Clinician: Referring Provider: Mingo Amber, JEFFREY Treating Provider/Extender: Melburn Hake, HOYT Weeks in Treatment: 0 History of Present Illness HPI Description: 06/30/17 on evaluation today patient presents with a recurrent ulcer of the medial right ankle. He has had a region of atrophy and discoloration since he had vein strippin "years ago". Nonetheless she tells me at this point on evaluation today that this area reopened a 2 weeks ago and he initially contacted the [facility] wound center for an appointment but the appointment was so far out that he was unable to wait till that point. He therefore made an appointment here in [place] per the recommendation. He has been tolerating the wound from the standpoint of pain although he tells me it's not too painful. 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 He knows he should be using it however. Fortunately there's no evidence of infection in the wound appears to be doing well. Electronic Signature(s) Signed: 07/01/2017 10:10:39 AM By: Worthy Keeler PA-C Entered By: Worthy Keeler on 06/30/2017 09:08:41 Palau, Lawerence L. ([id number]) -------------------------------------------------------------------------------- Physical Exam Details Patient Name: [name], [name] Date of Service: 06/30/2017 8:00 AM Medical Record Number: [id number] Patient Account Number: [id number] Date of Birth/Sex: [birth year] (61 y.o. Male) Treating RN: Montey Hora Primary Care Provider: Esmond Camper Other Clinician: Referring Provider: Mingo Amber, JEFFREY Treating Provider/Extender: STONE III, HOYT Weeks in Treatment: 0 Constitutional patient is hypertensive.. pulse regular and within target range for patient.Marland Kitchen respirations regular, non-labored and within target range for patient.Marland Kitchen temperature within target range for patient.. Obese and well-hydrated in no acute distress. Eyes conjunctiva clear no eyelid edema noted. pupils equal round and reactive to light and accommodation. Ears, Nose, Mouth, and Throat no gross abnormality of ear auricles or external auditory canals. normal hearing noted during conversation. mucus membranes moist. Respiratory normal breathing without difficulty. clear to auscultation bilaterally. Cardiovascular regular rate and rhythm with normal S1, S2. 2+ dorsalis pedis/posterior tibialis pulses. 1+ pitting edema of the bilateral lower extremities. Gastrointestinal (GI) soft, non-tender, non-distended, +BS. no ventral hernia noted. Musculoskeletal normal gait and posture. no significant deformity or arthritic changes, no loss or range of motion, no clubbing. Psychiatric this patient is able to make decisions and demonstrates good insight into  disease process. Alert and Oriented x 3. pleasant and cooperative. Notes Patient's wound appears to have an excellent granular bed without any sign of infection is slightly tender to palpation but not significantly. No debridement necessary. Electronic Signature(s) Signed: 07/01/2017 10:10:39 AM By: Worthy Keeler PA-C Entered By: Worthy Keeler on 06/30/2017 09:14:36 Buescher, Rayford L. ([id number]) --------------------------------------------------------------------------------  Physician Orders Details Patient Name: [name] [name]. Date of Service: 06/30/2017 8:00 AM Medical Record Number: [id number] Patient Account Number: [id number] Date of Birth/Sex: [birth year] (61 y.o. Male) Treating RN: Montey Hora Primary Care Provider: Esmond Camper Other Clinician: Referring Provider: Mingo Amber, JEFFREY Treating Provider/Extender: STONE III, HOYT Weeks in Treatment: 0 Verbal / Phone Orders: No Diagnosis Coding Wound Cleansing Wound #1 Right,Medial Malleolus o Clean wound with Normal Saline. Anesthetic Wound #1 Right,Medial Malleolus o Topical Lidocaine 4% cream applied to wound bed prior to debridement Primary Wound Dressing Wound #1 Right,Medial Malleolus o Aquacel Ag Secondary Dressing Wound #1 Right,Medial Malleolus o XtraSorb Dressing Change Frequency Wound #1 Right,Medial Malleolus o Change dressing every other day. Follow-up Appointments Wound #1 Right,Medial Malleolus o Return Appointment in 1 week. o Nurse Visit as needed Edema Control Wound #1 Right,Medial Malleolus o 4-Layer Compression System - Right Lower Extremity Additional Orders / Instructions Wound #1 Right,Medial Malleolus o Increase protein intake. o Other: - Please add vitamin A, vitamin C and zinc supplements to your diet Purington, Cane L. ([id number]) Notes I am going to recommend that we initiate a silver alginate dressing with a four layer compression. Patient has previously  done very well with this when the ulcer opened up roughly 2 years ago and he was treated at that time. If anything worsens in the interim patient will contact the office for additional recommendations. Otherwise we will see him for reevaluation in one week to see were things stand. I did have a conversation with him concerning the fact that once this wound is healed it will be of benefit for him to wear his home compression in order to prevent this from reoccurring. He voiced understanding. Electronic Signature(s) Signed: 07/01/2017 10:10:39 AM By: Worthy Keeler PA-C Entered By: Worthy Keeler on 06/30/2017 09:13:57 Schouten, Norvel L. ([id number]) -------------------------------------------------------------------------------- Problem List Details Patient Name: MAN, EFFERTZ L. Date of Service: 06/30/2017 8:00 AM Medical Record Number: [id number] Patient Account Number: [id number] Date of Birth/Sex: 1956/09/28 (61 y.o. 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 ([id number]) -------------------------------------------------------------------------------- Progress Note Details Patient Name: MICHAL, CALLICOTT L. Date of Service: 06/30/2017 8:00 AM Medical Record Number: [id number] Patient Account Number: [id number] Date of Birth/Sex: [birth year] (61 y.o. Male) Treating RN: Montey Hora Primary Care Provider: Esmond Camper Other Clinician: Referring Provider: Esmond Camper Treating Provider/Extender: Melburn Hake, HOYT Weeks in Treatment: 0 Subjective Chief Complaint Information obtained from Patient Right Medial Ankle Ulcer History of Present Illness (HPI) 06/30/17 on evaluation today patient presents with a recurrent ulcer of the medial right ankle. He has had a region of atrophy and discoloration since he had vein strippin "years ago". Nonetheless she tells me at this point on evaluation today that this area reopened a 2 weeks ago and he initially contacted the [facility] wound center for an appointment but the appointment was so far out that he was unable to wait till that point. He therefore made an appointment here in [place] per the recommendation. He has been tolerating the wound from the standpoint of pain  although he tells me it's not too painful. 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 He knows he should be using it however. Fortunately there's no evidence of infection in the wound appears to be doing well. Wound History Patient presents with 1 open wound that has been present for approximately 2 weeks. Patient has been treating wound in the following manner: dry dressing. Laboratory tests have not been performed in the last month. Patient reportedly has not tested positive for an antibiotic resistant organism. Patient reportedly has not tested positive for osteomyelitis. Patient reportedly has had testing performed to evaluate circulation in the legs. Patient History Information obtained from Patient. Allergies No Known Drug Allergies Social History Never smoker, Marital Status - Divorced, Alcohol Use - Rarely, Drug Use - No History, Caffeine Use - Daily. Medical History Cardiovascular Patient has history  of Hypertension Medical And Surgical History Notes Haraway, Lenvil L. ([id number]) Cardiovascular varicose veins, HLD Review of Systems (ROS) Constitutional Symptoms (General Health) The patient has no complaints or symptoms. Eyes Complains or has symptoms of Glasses / Contacts. Ear/Nose/Mouth/Throat The patient has no complaints or symptoms. Hematologic/Lymphatic The patient has no complaints or symptoms. Respiratory The patient has no complaints or symptoms. Cardiovascular Complains or has symptoms of LE edema. Gastrointestinal The patient has no complaints or symptoms. Endocrine The patient has no complaints or symptoms. Genitourinary The patient has no complaints or symptoms. Immunological The patient has no complaints or symptoms. Integumentary (Skin) The patient has no complaints or symptoms. Musculoskeletal The patient has no complaints or symptoms. Neurologic The patient has no complaints or symptoms. Oncologic The patient has no complaints or symptoms. Psychiatric The patient has no complaints or symptoms. Objective Constitutional patient is hypertensive.. pulse regular and within target range for patient.Marland Kitchen respirations regular, non-labored and within target range for patient.Marland Kitchen temperature within target range for patient.. Obese and well-hydrated in no acute distress. Brouwer, Rahmon L. ([id number]) Vitals Time Taken: 8:12 AM, Height: 72 in, Source: Measured, Weight: 245 lbs, Source: Measured, BMI: 33.2, Temperature: 98.1 F, Pulse: 74 bpm, Respiratory Rate: 18 breaths/min, Blood Pressure: 153/88 mmHg. Eyes conjunctiva clear no eyelid edema noted. pupils equal round and reactive to light and accommodation. Ears, Nose, Mouth, and Throat no gross abnormality of ear auricles or external auditory canals. normal hearing noted during conversation. mucus membranes moist. Respiratory normal breathing without difficulty. clear to auscultation  bilaterally. Cardiovascular regular rate and rhythm with normal S1, S2. 2+ dorsalis pedis/posterior tibialis pulses. 1+ pitting edema of the bilateral lower extremities. Gastrointestinal (GI) soft, non-tender, non-distended, +BS. no ventral hernia noted. Musculoskeletal normal gait and posture. no significant deformity or arthritic changes, no loss or range of motion, no clubbing. Psychiatric this patient is able to make decisions and demonstrates good insight into disease process. Alert and Oriented x 3. pleasant and cooperative. General Notes: Patient's wound appears to have an excellent granular bed without any sign of infection is slightly tender to palpation but not significantly. No debridement necessary. Integumentary (Hair, Skin) Wound #1 status is Open. Original cause of wound was Gradually Appeared. The wound is located on the Right,Medial Malleolus. The wound measures 44.3cm length x 3.2cm width x 0.1cm depth; 111.338cm^2 area and 11.134cm^3 volume. There is no tunneling or undermining noted. There is a large amount of serous drainage noted. The wound margin is flat and intact. There is small (  1-33%) pink granulation within the wound bed. There is a large (67-100%) amount of necrotic tissue within the wound bed including Adherent Slough. The periwound skin appearance exhibited: Ecchymosis. The periwound skin appearance did not exhibit: Callus, Crepitus, Excoriation, Induration, Rash, Scarring, Dry/Scaly, Maceration, Atrophie Blanche, Cyanosis, Hemosiderin Staining, Mottled, Pallor, Rubor, Erythema. Periwound temperature was noted as No Abnormality. The periwound has tenderness on palpation. Assessment Rineer, Dacen L. ([id number]) Active Problems ICD-10 I87.2 - Venous insufficiency (chronic) (peripheral) L97.312 - Non-pressure chronic ulcer of right ankle with fat layer exposed I10 - Essential (primary) hypertension Plan Wound Cleansing: Wound #1 Right,Medial  Malleolus: Clean wound with Normal Saline. Anesthetic: Wound #1 Right,Medial Malleolus: Topical Lidocaine 4% cream applied to wound bed prior to debridement Primary Wound Dressing: Wound #1 Right,Medial Malleolus: Aquacel Ag Secondary Dressing: Wound #1 Right,Medial Malleolus: XtraSorb Dressing Change Frequency: Wound #1 Right,Medial Malleolus: Change dressing every other day. Follow-up Appointments: Wound #1 Right,Medial Malleolus: Return Appointment in 1 week. Nurse Visit as needed Edema Control: Wound #1 Right,Medial Malleolus: 4-Layer Compression System - Right Lower Extremity Additional Orders / Instructions: Wound #1 Right,Medial Malleolus: Increase protein intake. Other: - Please add vitamin A, vitamin C and zinc supplements to your diet General Notes: I am going to recommend that we initiate a silver alginate dressing with a four layer compression. Patient has previously done very well with this when the ulcer opened up roughly 2 years ago and he was treated at that time. If anything worsens in the interim patient will contact the office for additional recommendations. Otherwise we will see him for reevaluation in one week to see were things stand. I did have a conversation with him concerning the fact that once this wound is healed it will be of benefit for him to wear his home compression in order to prevent this from reoccurring. He voiced understanding. Kouns, Terry L. ([id number]) Electronic Signature(s) Signed: 07/01/2017 10:10:39 AM By: Worthy Keeler PA-C Entered By: Worthy Keeler on 06/30/2017 09:14:55 Mcleish, Carrie L. ([id number]) -------------------------------------------------------------------------------- ROS/PFSH Details Patient Name: AVISHAI, REIHL L. Date of Service: 06/30/2017 8:00 AM Medical Record Number: [id number] Patient Account Number: [id number] Date of Birth/Sex: [birth year] (62 y.o. Male) Treating RN: Montey Hora Primary Care  Provider: Esmond Camper Other Clinician: Referring Provider: Mingo Amber, JEFFREY Treating Provider/Extender: STONE III, HOYT Weeks in Treatment: 0 Information Obtained From Patient Wound History Do you currently have one or more open woundso Yes How many open wounds do you currently haveo 1 Approximately how long have you had your woundso 2 weeks How have you been treating your wound(s) until nowo dry dressing Has your wound(s) ever healed and then re-openedo No Have you had any lab work done in the past montho No Have you tested positive for an antibiotic resistant organism (MRSA, VRE)o No Have you tested positive for osteomyelitis (bone infection)o No Have you had any tests for circulation on your legso Yes Who ordered the testo PCP Where was the test doneo [facility] Complaints and Symptoms: Positive for: Glasses / Contacts Cardiovascular Complaints and Symptoms: Positive for: LE edema Medical History: Positive for: Hypertension Past Medical History Notes: varicose veins, HLD Constitutional Symptoms (General Health) Complaints and Symptoms: No Complaints or Symptoms Ear/Nose/Mouth/Throat Complaints and Symptoms: No Complaints or Symptoms Hematologic/Lymphatic Zeigler, Devario L. ([id number]) Complaints and Symptoms: No Complaints or Symptoms Respiratory Complaints and Symptoms: No Complaints or Symptoms Gastrointestinal Complaints and Symptoms: No Complaints or Symptoms Endocrine Complaints and Symptoms: No Complaints or Symptoms Genitourinary Complaints and Symptoms: No Complaints  or Symptoms Immunological Complaints and Symptoms: No Complaints or Symptoms Integumentary (Skin) Complaints and Symptoms: No Complaints or Symptoms Musculoskeletal Complaints and Symptoms: No Complaints or Symptoms Neurologic Complaints and Symptoms: No Complaints or Symptoms Oncologic Complaints and Symptoms: No Complaints or Symptoms Psychiatric Complaints  and Symptoms: No Complaints or Symptoms Yandell, Wesson L. ([id number]) Immunizations Pneumococcal Vaccine: Received Pneumococcal Vaccination: No Immunization Notes: up to date Family and Social History Never smoker; Marital Status - Divorced; Alcohol Use: Rarely; Drug Use: No History; Caffeine Use: Daily; Financial Concerns: No; Food, Clothing or Shelter Needs: No; Support System Lacking: No; Transportation Concerns: No; Advanced Directives: No; Patient does not want information on Advanced Directives Electronic Signature(s) Signed: 06/30/2017 8:28:32 AM By: Worthy Keeler PA-C Signed: 06/30/2017 5:40:02 PM By: Montey Hora Entered By: Montey Hora on 06/30/2017 08:18:00 Acord, Deric L. ([id number]) -------------------------------------------------------------------------------- SuperBill Details Patient Name: TALI, COSTER L. Date of Service: 06/30/2017 Medical Record Number: [id number] Patient Account Number: [id number] Date of Birth/Sex: 10-Oct-1956 (61 y.o. Male) Treating RN: Montey Hora Primary Care Provider: Esmond Camper Other Clinician: Referring Provider: Mingo Amber, JEFFREY Treating Provider/Extender: STONE III, HOYT Weeks in Treatment: 0 Diagnosis Coding ICD-10 Codes Code Description I87.2 Venous insufficiency (chronic) (peripheral) L97.312 Non-pressure chronic ulcer of right ankle with fat layer exposed [place] (primary) hypertension Facility Procedures CPT4: Description Modifier Quantity Code [id number] 99213 - WOUND CARE VISIT-LEV 3 EST PT 1 CPT4: [id number] (Facility Use Only) [id number] - APPLY [place] COMPRS LWR RT 1 LEG Physician Procedures CPT4 Code Description: [id number] 99214 - WC PHYS LEVEL 4 - EST PT ICD-10 Description Diagnosis I87.2 Venous insufficiency (chronic) (peripheral) L97.312 Non-pressure chronic ulcer of right ankle with fat I10 Essential (primary) hypertension Modifier: layer expose Quantity: 1 d Electronic Signature(s) Signed: 06/30/2017  9:28:35 AM By: Montey Hora Signed: 07/01/2017 10:10:39 AM By: Worthy Keeler PA-C Entered By: Montey Hora on 06/30/2017 09:28:35

## 2017-07-02 NOTE — Progress Notes (Signed)
Fitting, Clearence L. ([id number]) Visit Report for 06/30/2017 Abuse/Suicide Risk Screen Details Patient Name: [name] [name], [name] [name]. Date of Service: 06/30/2017 8:00 AM Medical Record Number: [id number] Patient Account Number: [id number] Date of Birth/Sex: January 01, 1956 (61 y.o. Male) Treating RN: Montey Hora Primary Care Deaveon Schoen: Esmond Camper Other Clinician: Referring Anaelle Dunton: Mingo Amber, JEFFREY Treating Conan Mcmanaway/Extender: STONE III, HOYT Weeks in Treatment: 0 Abuse/Suicide Risk Screen Items Answer ABUSE/SUICIDE RISK SCREEN: Has anyone close to you tried to hurt or harm you recentlyo No Do you feel uncomfortable with anyone in your familyo No Has anyone forced you do things that you didnot want to doo No Do you have any thoughts of harming yourselfo No Patient displays signs or symptoms of abuse and/or neglect. No Electronic Signature(s) Signed: 06/30/2017 5:40:02 PM By: Montey Hora Entered By: Montey Hora on 06/30/2017 08:12:29 [name] [name], [name] ([id number]) -------------------------------------------------------------------------------- Activities of Daily Living Details Patient Name: [name] [name], [name] [name]. Date of Service: 06/30/2017 8:00 AM Medical Record Number: [id number] Patient Account Number: [id number] Date of Birth/Sex: August 09, 1956 (61 y.o. Male) Treating RN: Montey Hora Primary Care Dudley Mages: Esmond Camper Other Clinician: Referring Kenney Going: Mingo Amber, JEFFREY Treating Errol Ala/Extender: STONE III, HOYT Weeks in Treatment: 0 Activities of Daily Living Items Answer Activities of Daily Living (Please select one for each item) Drive Automobile Completely Able Take Medications Completely Able Use Telephone Completely Able Care for Appearance Completely Able Use Toilet Completely Able Bath / Shower Completely Able Dress Self Completely Able Feed Self Completely Able Walk Completely Able Get In / Out Bed Completely Able Housework Completely Able Prepare Meals  Completely Able Handle Money Completely Able Shop for Self Completely Able Electronic Signature(s) Signed: 06/30/2017 5:40:02 PM By: Montey Hora Entered By: Montey Hora on 06/30/2017 08:12:46 [name] [name], [name] ([id number]) -------------------------------------------------------------------------------- Education Assessment Details Patient Name: [name] [name], [name] [name]. Date of Service: 06/30/2017 8:00 AM Medical Record Number: [id number] Patient Account Number: [id number] Date of Birth/Sex: 1956/09/26 (61 y.o. Male) Treating RN: Montey Hora Primary Care Giannamarie Paulus: Esmond Camper Other Clinician: Referring Doniel Maiello: Esmond Camper Treating Abrar Bilton/Extender: Melburn Hake, HOYT Weeks in Treatment: 0 Primary Learner Assessed: Patient Learning Preferences/Education Level/Primary Language Learning Preference: Explanation, Demonstration Highest Education Level: College or Above Preferred Language: English Cognitive Barrier Assessment/Beliefs Language Barrier: No Translator Needed: No Memory Deficit: No Emotional Barrier: No Cultural/Religious Beliefs Affecting Medical No Care: Physical Barrier Assessment Impaired Vision: No Impaired Hearing: No Decreased Hand dexterity: No Knowledge/Comprehension Assessment Knowledge Level: Medium Comprehension Level: Medium Ability to understand written Medium instructions: Ability to understand verbal Medium instructions: Motivation Assessment Anxiety Level: Calm Cooperation: Cooperative Education Importance: Acknowledges Need Interest in Health Problems: Asks Questions Perception: Coherent Willingness to Engage in Self- Medium Management Activities: Readiness to Engage in Self- Medium Management Activities: Electronic Signature(s) [name] [name], [name] [name] ([id number]) Signed: 06/30/2017 5:40:02 PM By: Montey Hora Entered By: Montey Hora on 06/30/2017 08:13:05 [name] [name], [name]  ([id number]) -------------------------------------------------------------------------------- Fall Risk Assessment Details Patient Name: [name] [name], [name] Date of Service: 06/30/2017 8:00 AM Medical Record Number: [id number] Patient Account Number: [id number] Date of Birth/Sex: 06/11/1956 (61 y.o. Male) Treating RN: Montey Hora Primary Care Joyann Spidle: Esmond Camper Other Clinician: Referring Roby Spalla: Mingo Amber, JEFFREY Treating Makaylin Carlo/Extender: Melburn Hake, HOYT Weeks in Treatment: 0 Fall Risk Assessment Items Have you had 2 or more falls in the last 12 monthso 0 No Have you had any fall that resulted in injury in the last 12 monthso 0 No FALL RISK ASSESSMENT: History of falling - immediate or within 3 months 0 No Secondary diagnosis 0 No Ambulatory aid None/bed rest/wheelchair/nurse 0 Yes  Crutches/cane/walker 0 No Furniture 0 No IV Access/Saline Lock 0 No Gait/Training Normal/bed rest/immobile 0 Yes Weak 0 No Impaired 0 No Mental Status Oriented to own ability 0 Yes Electronic Signature(s) Signed: 06/30/2017 5:40:02 PM By: Montey Hora Entered By: Montey Hora on 06/30/2017 08:13:14 [name] [name], [name] [name] ([id number]) -------------------------------------------------------------------------------- Foot Assessment Details Patient Name: [name] [name], [name] Date of Service: 06/30/2017 8:00 AM Medical Record Number: [id number] Patient Account Number: [id number] Date of Birth/Sex: 22-Oct-1956 (61 y.o. Male) Treating RN: Montey Hora Primary Care Dawsyn Zurn: Esmond Camper Other Clinician: Referring Ivannia Willhelm: Mingo Amber, JEFFREY Treating Casimira Sutphin/Extender: STONE III, HOYT Weeks in Treatment: 0 Foot Assessment Items Site Locations + = Sensation present, - = Sensation absent, C = Callus, U = Ulcer R = Redness, W = Warmth, M = Maceration, PU = Pre-ulcerative lesion F = Fissure, S = Swelling, D = Dryness Assessment Right: Left: Other Deformity: No No Prior Foot Ulcer: No No Prior  Amputation: No No Charcot Joint: No No Ambulatory Status: Ambulatory Without Help Gait: Steady Electronic Signature(s) Signed: 06/30/2017 5:40:02 PM By: Montey Hora Entered By: Montey Hora on 06/30/2017 08:32:35 [name] [name], [name] ([id number]) -------------------------------------------------------------------------------- Nutrition Risk Assessment Details Patient Name: [name] [name], [name] Date of Service: 06/30/2017 8:00 AM Medical Record Number: [id number] Patient Account Number: [id number] Date of Birth/Sex: [birth year] (60 y.o. Male) Treating RN: Montey Hora Primary Care Blanche Gallien: Esmond Camper Other Clinician: Referring Gagan Dillion: Mingo Amber, JEFFREY Treating Tasheika Kitzmiller/Extender: STONE III, HOYT Weeks in Treatment: 0 Height (in): Weight (lbs): 245 Body Mass Index (BMI): Nutrition Risk Assessment Items NUTRITION RISK SCREEN: I have an illness or condition that made me change the kind and/or 0 No amount of food I eat I eat fewer than two meals per day 0 No I eat few fruits and vegetables, or milk products 0 No I have three or more drinks of beer, liquor or wine almost every day 0 No I have tooth or mouth problems that make it hard for me to eat 0 No I don't always have enough money to buy the food I need 0 No I eat alone most of the time 0 No I take three or more different prescribed or over-the-counter drugs a 1 Yes day Without wanting to, I have lost or gained 10 pounds in the last six 0 No months I am not always physically able to shop, cook and/or feed myself 0 No Nutrition Protocols Good Risk Protocol 0 No interventions needed Moderate Risk Protocol Electronic Signature(s) Signed: 06/30/2017 5:40:02 PM By: Montey Hora Entered By: Montey Hora on 06/30/2017 08:13:21

## 2017-07-06 ENCOUNTER — Encounter: Payer: 59 | Admitting: Internal Medicine

## 2017-07-06 DIAGNOSIS — Z6833 Body mass index (BMI) 33.0-33.9, adult: Secondary | ICD-10-CM | POA: Diagnosis not present

## 2017-07-06 DIAGNOSIS — I872 Venous insufficiency (chronic) (peripheral): Secondary | ICD-10-CM | POA: Diagnosis not present

## 2017-07-06 DIAGNOSIS — L97312 Non-pressure chronic ulcer of right ankle with fat layer exposed: Secondary | ICD-10-CM | POA: Diagnosis not present

## 2017-07-06 DIAGNOSIS — I10 Essential (primary) hypertension: Secondary | ICD-10-CM | POA: Diagnosis not present

## 2017-07-06 DIAGNOSIS — E785 Hyperlipidemia, unspecified: Secondary | ICD-10-CM | POA: Diagnosis not present

## 2017-07-06 DIAGNOSIS — E669 Obesity, unspecified: Secondary | ICD-10-CM | POA: Diagnosis not present

## 2017-07-07 ENCOUNTER — Ambulatory Visit: Payer: 59 | Admitting: Internal Medicine

## 2017-07-07 NOTE — Progress Notes (Signed)
[name], [name] ([id number]) Visit Report for 07/06/2017 HPI Details Patient Name: [name] [name], [name] [name]. Date of Service: 07/06/2017 11:00 AM Medical Record Number: [id number] Patient Account Number: [id number] Date of Birth/Sex: 1956/05/20 (61 y.o. Male) Treating RN: Montey Hora Primary Care Provider: Esmond Camper Other Clinician: Referring Provider: Mingo Amber, JEFFREY Treating Provider/Extender: Tito Dine in Treatment: 0 History of Present Illness HPI Description: 06/30/17 on evaluation today patient presents with a recurrent ulcer of the medial right ankle. He has had a region of atrophy and discoloration since he had vein strippin "years ago". Nonetheless she tells me at this point on evaluation today that this area reopened a 2 weeks ago and he initially contacted the [facility] wound center for an appointment but the appointment was so far out that he was unable to wait till that point. He therefore made an appointment here in [place] per the recommendation. He has been tolerating the wound from the standpoint of pain although he tells me it's not too painful. 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 He knows he should be using it however. Fortunately there's no evidence of infection in the wound appears to be doing well. 07/06/17; patient admitted to the clinic last week with a recurrent venous insufficiency ulcer on the medial right ankle. Fairly extensive open wound with surrounding erythematous discolored scan as well as small dilated veins inferiorly in the wound and medially in the foot. He describes tenderness actually lower down in the foot over these veins. His history goes back to vein stripping on the right leg 15 years ago. He was cared for in [place] when are sister clinic by Dr. Con Memos in 2016 at that time with a wound  in the same spot. After he left the clinic he went on to laser ablation by [facility] I'll need to check these records. Per our intake nurse the wound looks better today. Electronic Signature(s) Signed: 07/06/2017 5:59:51 PM By: Linton Ham MD Entered By: Linton Ham on 07/06/2017 12:23:17 Kister, Adryan L. ([id number]) -------------------------------------------------------------------------------- Physical Exam Details Patient Name: [name], [name] Date of Service: 07/06/2017 11:00 AM Medical Record Number: [id number] Patient Account Number: [id number] Date of Birth/Sex: Nov 12, 1956 (61 y.o. Male) Treating RN: Montey Hora Primary Care Provider: Esmond Camper Other Clinician: Referring Provider: Mingo Amber, JEFFREY Treating Provider/Extender: Tito Dine in Treatment: 0 Constitutional Patient is hypertensive.. Pulse regular and within target range for patient.Marland Kitchen Respirations regular, non-labored and within target range.. Temperature is normal and within the target range for the patient.Marland Kitchen appears in no distress. Eyes Conjunctivae clear. No discharge. Respiratory Respiratory effort is easy and symmetric bilaterally. Rate is normal at rest and on room air.. Cardiovascular Pedal pulses palpable on the right. Edema is well controlled on the right. Lymphatic None palpable in the right popliteal area. Psychiatric No evidence of depression, anxiety, or agitation. Calm, cooperative, and communicative. Appropriate interactions and affect.. Notes Wound exam; the wound on the medial right ankle as a healthy granulated base however there is very significant chronic inflammation around the wound which is nontender to the touch not warm. In the inferior area of the wound actually on the medial foot there is dilated superficial veins which appear to be tender question thrombosis Electronic Signature(s) Signed: 07/06/2017 5:59:51 PM By: Linton Ham  MD Entered By: Linton Ham on 07/06/2017 12:25:00  Banghart, Osker L. ([id number]) -------------------------------------------------------------------------------- Physician Orders Details Patient Name: [name], [name] Date of Service: 07/06/2017 11:00 AM Medical Record Number: [id number] Patient Account Number: [id number] Date of Birth/Sex: [birth year] (61 y.o. Male) Treating RN: Montey Hora Primary Care Provider: Esmond Camper Other Clinician: Referring Provider: Mingo Amber, JEFFREY Treating Provider/Extender: Tito Dine in Treatment: 0 Verbal / Phone Orders: No Diagnosis Coding Wound Cleansing Wound #1 Right,Medial Malleolus o Clean wound with Normal Saline. Anesthetic Wound #1 Right,Medial Malleolus o Topical Lidocaine 4% cream applied to wound bed prior to debridement Skin Barriers/Peri-Wound Care Wound #1 Right,Medial Malleolus o Barrier cream Primary Wound Dressing Wound #1 Right,Medial Malleolus o Aquacel Ag Secondary Dressing Wound #1 Right,Medial Malleolus o XtraSorb Dressing Change Frequency Wound #1 Right,Medial Malleolus o Change dressing every week Follow-up Appointments Wound #1 Right,Medial Malleolus o Return Appointment in 1 week. o Nurse Visit as needed Edema Control Wound #1 Right,Medial Malleolus o 4-Layer Compression System - Right Lower Extremity Additional Orders / Instructions Juhasz, Dimetri L. ([id number]) Wound #1 Right,Medial Malleolus o Increase protein intake. o Other: - Please add vitamin A, vitamin C and zinc supplements to your diet Electronic Signature(s) Signed: 07/06/2017 5:46:25 PM By: Montey Hora Signed: 07/06/2017 5:59:51 PM By: Linton Ham MD Entered By: Montey Hora on 07/06/2017 11:33:00 Rodak, Guerin L. ([id number]) -------------------------------------------------------------------------------- Problem List Details Patient Name: [name], [name] Date of Service: 07/06/2017  11:00 AM Medical Record Number: [id number] Patient Account Number: [id number] Date of Birth/Sex: July 23, 1956 (61 y.o. 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 Date of Service: 07/06/2017 11:00 AM Medical Record Number: [id number] Patient Account Number: [id number] Date of Birth/Sex: 22-Sep-1956 (61 y.o. Male) Treating RN: Montey Hora Primary Care Provider: Esmond Camper Other Clinician: Referring Provider: Mingo Amber, JEFFREY Treating Provider/Extender: Tito Dine in Treatment: 0 Subjective History of Present Illness (HPI) 06/30/17 on evaluation today patient presents with a recurrent ulcer of the medial right ankle. He has had a region of atrophy and discoloration since he had vein strippin "years ago". Nonetheless she tells me at this point on evaluation today that this area reopened a 2 weeks ago and he initially contacted the [facility] wound center for an appointment but the appointment was so far out that he was unable to wait till that point. He therefore made an appointment here in [place] per the recommendation. He has been tolerating the wound from the standpoint of pain although he tells me it's not too painful.  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 He knows he should be using it however. Fortunately there's no evidence of infection in the wound appears to be doing well. 07/06/17; patient admitted to the clinic last week with a recurrent venous insufficiency ulcer on the medial right ankle. Fairly extensive open wound with surrounding erythematous discolored scan as well as small dilated veins inferiorly in the wound  and medially in the foot. He describes tenderness actually lower down in the foot over these veins. His history goes back to vein stripping on the right leg 15 years ago. He was cared for in [place] when are sister clinic by Dr. Con Memos in 2016 at that time with a wound in the same spot. After he left the clinic he went on to laser ablation by Dr[facility]. I'll need to check these records. Per our intake nurse the wound looks better today. Objective Constitutional Patient is hypertensive.. Pulse regular and within target range for patient.Marland Kitchen Respirations regular, non-labored and within target range.. Temperature is normal and within the target range for the patient.Marland Kitchen appears in no distress. Vitals Time Taken: 11:15 AM, Height: 72 in, Weight: 245 lbs, BMI: 33.2, Temperature: 98.3 F, Pulse: 79 bpm, Respiratory Rate: 18 breaths/min, Blood Pressure: 145/82 mmHg. Eyes Emigh, Renly L. ([id number]) Conjunctivae clear. No discharge. Respiratory Respiratory effort is easy and symmetric bilaterally. Rate is normal at rest and on room air.. Cardiovascular Pedal pulses palpable on the right. Edema is well controlled on the right. Lymphatic None palpable in the right popliteal area. Psychiatric No evidence of depression, anxiety, or agitation. Calm, cooperative, and communicative. Appropriate interactions and affect.. General  Notes: Wound exam; the wound on the medial right ankle as a healthy granulated base however there is very significant chronic inflammation around the wound which is nontender to the touch not warm. In the inferior area of the wound actually on the medial foot there is dilated superficial veins which appear to be tender question thrombosis Integumentary (Hair, Skin) Wound #1 status is Open. Original cause of wound was Gradually Appeared. The wound is located on the Right,Medial Malleolus. The wound measures 4cm length x 3.1cm width x 0.1cm depth; 9.739cm^2 area and 0.974cm^3 volume. There is no tunneling or undermining noted. There is a large amount of sanguinous drainage noted. The wound margin is flat and intact. There is large (67-100%) pink granulation within the wound bed. There is a small (1-33%) amount of necrotic tissue within the wound bed including Adherent Slough. The periwound skin appearance exhibited: Ecchymosis. The periwound skin appearance did not exhibit: Callus, Crepitus, Excoriation, Induration, Rash, Scarring, Dry/Scaly, Maceration, Atrophie Blanche, Cyanosis, Hemosiderin Staining, Mottled, Pallor, Rubor, Erythema. Periwound temperature was noted as No Abnormality. The periwound has tenderness on palpation. Assessment Active Problems ICD-10 I87.2 - Venous insufficiency (chronic) (peripheral) L97.312 - Non-pressure chronic ulcer of right ankle with fat layer exposed I10 - Essential (primary) hypertension Plan Kirchner, Barack L. ([id number]) Wound Cleansing: Wound #1 Right,Medial Malleolus: Clean wound with Normal Saline. Anesthetic: Wound #1 Right,Medial Malleolus: Topical Lidocaine 4% cream applied to wound bed prior to debridement Skin Barriers/Peri-Wound Care: Wound #1 Right,Medial Malleolus: Barrier cream Primary Wound Dressing: Wound #1 Right,Medial Malleolus: Aquacel Ag Secondary Dressing: Wound #1 Right,Medial Malleolus: XtraSorb Dressing Change  Frequency: Wound #1 Right,Medial Malleolus: Change dressing every week Follow-up Appointments: Wound #1 Right,Medial Malleolus: Return Appointment in 1 week. Nurse Visit as needed Edema Control: Wound #1 Right,Medial Malleolus: 4-Layer Compression System - Right Lower Extremity Additional Orders / Instructions: Wound #1 Right,Medial Malleolus: Increase protein intake. Other: - Please add vitamin A, vitamin C and zinc supplements to your diet #1 liberal TCA 0.1%, Aquacel Ag, ABDs under 4 layer compression which he appears to be tolerating #2 the patient is definitely going to need compression stockings which she is not been using up until now #3 I'll review his notes from Dr. Kellie Simmering at [facility] to see if he felt that there was anything additionally they could be done to help this damaged area Electronic Signature(s) Signed: 07/06/2017 5:59:51 PM By: Linton Ham MD Entered By: Linton Ham on 07/06/2017 12:26:14 Llerenas, Maclovio L. ([id number]) -------------------------------------------------------------------------------- SuperBill Details Patient Name: CRANFORD, BLESSINGER L. Date of Service: 07/06/2017 Medical Record Number: [id number] Patient Account Number: [id number] Date of Birth/Sex: 01-18-1956 (61 y.o. Male) Treating RN: Montey Hora Primary Care Provider: Esmond Camper Other Clinician: Referring Provider: Mingo Amber, JEFFREY Treating Provider/Extender: Tito Dine in Treatment: 0 Diagnosis Coding ICD-10 Codes Code Description I87.2 Venous insufficiency (chronic) (peripheral) L97.312 Non-pressure chronic ulcer of right ankle with fat layer exposed [place] (primary) hypertension Facility Procedures CPT4: Description Modifier Quantity Code [phone] (Facility Use Only) [phone] - APPLY [place] RT 1 LEG Physician Procedures CPT4 Code Description: [id number] - WC PHYS LEVEL 3 - EST PT ICD-10 Description Diagnosis I87.2 Venous  insufficiency (chronic) (peripheral) L97.312 Non-pressure chronic ulcer of right ankle with fat Modifier: layer expose Quantity: 1 d Electronic Signature(s) Signed: 07/06/2017 12:49:23 PM By: Montey Hora Signed: 07/06/2017 5:59:51 PM By: Linton Ham MD Entered By: Montey Hora on 07/06/2017 12:49:22

## 2017-07-08 NOTE — Progress Notes (Signed)
Brazier, Friend L. ([id number]) Visit Report for 07/06/2017 Arrival Information Details Patient Name: [name] [name], [name] [name]. Date of Service: 07/06/2017 11:00 AM Medical Record Number: [id number] Patient Account Number: [id number] Date of Birth/Sex: 03-Dec-1956 (61 y.o. Male) Treating RN: Montey Hora Primary Care Steven Veazie: Esmond Camper Other Clinician: Referring Ngoc Detjen: Mingo Amber, JEFFREY Treating Margherita Collyer/Extender: Tito Dine in Treatment: 0 Visit Information History Since Last Visit Added or deleted any medications: No Patient Arrived: Ambulatory Any new allergies or adverse reactions: No Arrival Time: 11:10 Had a fall or experienced change in No Accompanied By: self activities of daily living that may affect Transfer Assistance: None risk of falls: Patient Identification Verified: Yes Signs or symptoms of abuse/neglect since last No Secondary Verification Process Yes visito Completed: Hospitalized since last visit: No Has Dressing in Place as Prescribed: Yes Has Compression in Place as Prescribed: Yes Pain Present Now: Yes Electronic Signature(s) Signed: 07/06/2017 5:46:25 PM By: Montey Hora Entered By: Montey Hora on 07/06/2017 11:15:12 Omara, Domonic L. ([id number]) -------------------------------------------------------------------------------- Encounter Discharge Information Details Patient Name: [name] [name]. Date of Service: 07/06/2017 11:00 AM Medical Record Number: [id number] Patient Account Number: [id number] Date of Birth/Sex: 1956/11/09 (61 y.o. Male) Treating RN: Montey Hora Primary Care Chari Parmenter: Esmond Camper Other Clinician: Referring Liann Spaeth: Mingo Amber, JEFFREY Treating Rotunda Worden/Extender: Tito Dine in Treatment: 0 Encounter Discharge Information Items Discharge Pain Level: 0 Discharge Condition: Stable Ambulatory Status: Ambulatory Discharge Destination: Home Transportation: Private Auto Accompanied By:  self Schedule Follow-up Appointment: Yes Medication Reconciliation completed and provided to Patient/Care No Dovber Ernest: Provided on Clinical Summary of Care: 07/06/2017 Form Type Recipient Paper Patient RM Electronic Signature(s) Signed: 07/06/2017 12:50:19 PM By: Montey Hora Previous Signature: 07/06/2017 11:50:25 AM Version By: Ruthine Dose Entered By: Montey Hora on 07/06/2017 12:50:19 Rempel, Kaimani L. ([id number]) -------------------------------------------------------------------------------- Lower Extremity Assessment Details Patient Name: [name] [name]. Date of Service: 07/06/2017 11:00 AM Medical Record Number: [id number] Patient Account Number: [id number] Date of Birth/Sex: 1956/10/10 (61 y.o. Male) Treating RN: Montey Hora Primary Care Nayelis Bonito: Esmond Camper Other Clinician: Referring Sung Parodi: Mingo Amber, JEFFREY Treating Carlette Palmatier/Extender: Tito Dine in Treatment: 0 Edema Assessment Assessed: [Left: No] [Right: No] E[Left: dema] [Right: :] Calf Left: Right: Point of Measurement: 36 cm From Medial Instep cm 40.8 cm Ankle Left: Right: Point of Measurement: 12 cm From Medial Instep cm 26 cm Vascular Assessment Pulses: Dorsalis Pedis Palpable: [Right:Yes] Posterior Tibial Extremity colors, hair growth, and conditions: Extremity Color: [Right:Hyperpigmented] Hair Growth on Extremity: [Right:No] Temperature of Extremity: [Right:Warm] Capillary Refill: [Right:< 3 seconds] Electronic Signature(s) Signed: 07/06/2017 5:46:25 PM By: Montey Hora Entered By: Montey Hora on 07/06/2017 11:17:19 Coggeshall, Windom ([id number]) -------------------------------------------------------------------------------- Multi Wound Chart Details Patient Name: [name], [name] Date of Service: 07/06/2017 11:00 AM Medical Record Number: [id number] Patient Account Number: [id number] Date of Birth/Sex: [birth year] (61 y.o. Male) Treating RN: Montey Hora Primary Care Senon Nixon: Esmond Camper Other Clinician: Referring Korrey Schleicher: Mingo Amber, JEFFREY Treating Avrom Robarts/Extender: Tito Dine in Treatment: 0 Vital Signs Height(in): 72 Pulse(bpm): 79 Weight(lbs): 245 Blood Pressure 145/82 (mmHg): Body Mass Index(BMI): 33 Temperature(F): 98.3 Respiratory Rate 18 (breaths/min): Photos: [1:No Photos] [N/A:N/A] Wound Location: [1:Right Malleolus - Medial] [N/A:N/A] Wounding Event: [1:Gradually Appeared] [N/A:N/A] Primary Etiology: [1:Venous Leg Ulcer] [N/A:N/A] Comorbid History: [1:Hypertension] [N/A:N/A] Date Acquired: [1:06/15/2017] [N/A:N/A] Weeks of Treatment: [1:0] [N/A:N/A] Wound Status: [1:Open] [N/A:N/A] Measurements L x W x D 4x3.1x0.1 [N/A:N/A] (cm) Area (cm) : [1:9.739] [N/A:N/A] Volume (cm) : [1:0.974] [N/A:N/A] % Reduction in Area: [1:9.90%] [N/A:N/A] % Reduction in Volume: 9.90% [N/A:N/A] Classification: [  1:Full Thickness Without Exposed Support Structures] [N/A:N/A] Exudate Amount: [1:Large] [N/A:N/A] Exudate Type: [1:Sanguinous] [N/A:N/A] Exudate Color: [1:red] [N/A:N/A] Wound Margin: [1:Flat and Intact] [N/A:N/A] Granulation Amount: [1:Large (67-100%)] [N/A:N/A] Granulation Quality: [1:Pink] [N/A:N/A] Necrotic Amount: [1:Small (1-33%)] [N/A:N/A] Exposed Structures: [1:Fascia: No Fat Layer (Subcutaneous Tissue) Exposed: No Tendon: No Muscle: No] [N/A:N/A] Joint: No Bone: No Epithelialization: None N/A N/A Periwound Skin Texture: Excoriation: No N/A N/A Induration: No Callus: No Crepitus: No Rash: No Scarring: No Periwound Skin Maceration: No N/A N/A Moisture: Dry/Scaly: No Periwound Skin Color: Ecchymosis: Yes N/A N/A Atrophie Blanche: No Cyanosis: No Erythema: No Hemosiderin Staining: No Mottled: No Pallor: No Rubor: No Temperature: No Abnormality N/A N/A Tenderness on Yes N/A N/A Palpation: Wound Preparation: Ulcer Cleansing: Other: N/A N/A soap and water Topical  Anesthetic Applied: Other: lidocaine 4% Treatment Notes Electronic Signature(s) Signed: 07/06/2017 5:59:51 PM By: Linton Ham MD Entered By: Linton Ham on 07/06/2017 12:19:08 Garduno, Matamoras ([id number]) -------------------------------------------------------------------------------- [place] Details Patient Name: JARRAH, BABICH. Date of Service: 07/06/2017 11:00 AM Medical Record Number: [id number] Patient Account Number: [id number] Date of Birth/Sex: 10-Jan-1956 (61 y.o. Male) Treating RN: Montey Hora Primary Care Marlin Jarrard: Esmond Camper Other Clinician: Referring Georgia Baria: Mingo Amber, JEFFREY Treating Querida Beretta/Extender: Tito Dine in Treatment: 0 Active Inactive ` Abuse / Safety / Falls / Self Care Management Nursing Diagnoses: Potential for falls Goals: Patient will remain injury free related to falls Date Initiated: 06/30/2017 Target Resolution Date: 08/20/2017 Goal Status: Active Interventions: Assess fall risk on admission and as needed Notes: ` Orientation to the Wound Care Program Nursing Diagnoses: Knowledge deficit related to the wound healing center program Goals: Patient/caregiver will verbalize understanding of the [place] Date Initiated: 06/30/2017 Target Resolution Date: 08/20/2017 Goal Status: Active Interventions: Provide education on orientation to the wound center Notes: ` Wound/Skin Impairment Nursing Diagnoses: Impaired tissue integrity Mcvicar, Mattheus L. ([id number]) Goals: Ulcer/skin breakdown will have a volume reduction of 30% by week 4 Date Initiated: 06/30/2017 Target Resolution Date: 08/20/2017 Goal Status: Active Ulcer/skin breakdown will have a volume reduction of 50% by week 8 Date Initiated: 06/30/2017 Target Resolution Date: 08/20/2017 Goal Status: Active Ulcer/skin breakdown will have a volume reduction of 80% by week 12 Date Initiated: 06/30/2017 Target Resolution  Date: 08/20/2017 Goal Status: Active Ulcer/skin breakdown will heal within 14 weeks Date Initiated: 06/30/2017 Target Resolution Date: 08/20/2017 Goal Status: Active Interventions: Assess patient/caregiver ability to obtain necessary supplies Assess patient/caregiver ability to perform ulcer/skin care regimen upon admission and as needed Assess ulceration(s) every visit Notes: Electronic Signature(s) Signed: 07/06/2017 5:46:25 PM By: Montey Hora Entered By: Montey Hora on 07/06/2017 11:30:47 Wheless, Zebulen L. ([id number]) -------------------------------------------------------------------------------- Pain Assessment Details Patient Name: JERALD, HENNINGTON. Date of Service: 07/06/2017 11:00 AM Medical Record Number: [id number] Patient Account Number: [id number] Date of Birth/Sex: 21-Apr-1956 (61 y.o. Male) Treating RN: Montey Hora Primary Care [place] Knee: Esmond Camper Other Clinician: Referring Riki Berninger: Mingo Amber, JEFFREY Treating Mavin Dyke/Extender: Tito Dine in Treatment: 0 Active Problems Location of Pain Severity and Description of Pain Patient Has Paino Yes Site Locations Pain Location: Pain in Ulcers With Dressing Change: Yes Duration of the Pain. Constant / Intermittento Constant Pain Management and Medication Current Pain Management: Notes Topical or injectable lidocaine is offered to patient for acute pain when surgical debridement is performed. If needed, Patient is instructed to use over the counter pain medication for the following 24-48 hours after debridement. Wound care MDs do not prescribed pain medications. Patient has chronic pain or uncontrolled pain.  Patient has been instructed to make an appointment with their Primary Care Physician for pain management. Electronic Signature(s) Signed: 07/06/2017 5:46:25 PM By: Montey Hora Entered By: Montey Hora on 07/06/2017 11:15:29 Worton, Tiffany L.  ([id number]) -------------------------------------------------------------------------------- Patient/Caregiver Education Details Patient Name: ANDER, WAMSER L. Date of Service: 07/06/2017 11:00 AM Medical Record Number: [id number] Patient Account Number: [id number] Date of Birth/Gender: 08-Aug-1956 (61 y.o. Male) Treating RN: Montey Hora Primary Care Physician: Esmond Camper Other Clinician: Referring Physician: Esmond Camper Treating Physician/Extender: Tito Dine in Treatment: 0 Education Assessment Education Provided To: Patient Education Topics Provided Venous: Handouts: Other: leg elevation Methods: Explain/Verbal Responses: State content correctly Electronic Signature(s) Signed: 07/06/2017 5:46:25 PM By: Montey Hora Entered By: Montey Hora on 07/06/2017 12:50:38 Ryder, Graceson L. ([id number]) -------------------------------------------------------------------------------- Wound Assessment Details Patient Name: BAKER, MORONTA L. Date of Service: 07/06/2017 11:00 AM Medical Record Number: [id number] Patient Account Number: [id number] Date of Birth/Sex: 1956-04-23 (61 y.o. Male) Treating RN: Montey Hora Primary Care Shatia Sindoni: Esmond Camper Other Clinician: Referring Elianny Buxbaum: Mingo Amber, JEFFREY Treating Tyia Binford/Extender: Ricard Dillon Weeks in Treatment: 0 Wound Status Wound Number: 1 Primary Etiology: Venous Leg Ulcer Wound Location: Right Malleolus - Medial Wound Status: Open Wounding Event: Gradually Appeared Comorbid History: Hypertension Date Acquired: 06/15/2017 Weeks Of Treatment: 0 Clustered Wound: No Photos Photo Uploaded By: Montey Hora on 07/06/2017 13:37:48 Wound Measurements Length: (cm) 4 Width: (cm) 3.1 Depth: (cm) 0.1 Area: (cm) 9.739 Volume: (cm) 0.974 % Reduction in Area: 9.9% % Reduction in Volume: 9.9% Epithelialization: None Tunneling: No Undermining: No Wound Description Full Thickness Without  Exposed Classification: Support Structures Wound Margin: Flat and Intact Exudate Large Amount: Exudate Type: Sanguinous Exudate Color: red Foul Odor After Cleansing: No Slough/Fibrino Yes Wound Bed Granulation Amount: Large (67-100%) Exposed Structure Granulation Quality: Pink Fascia Exposed: No Necrotic Amount: Small (1-33%) Fat Layer (Subcutaneous Tissue) Exposed: No Rottinghaus, Zerick L. ([id number]) Necrotic Quality: Adherent Slough Tendon Exposed: No Muscle Exposed: No Joint Exposed: No Bone Exposed: No Periwound Skin Texture Texture Color No Abnormalities Noted: No No Abnormalities Noted: No Callus: No Atrophie Blanche: No Crepitus: No Cyanosis: No Excoriation: No Ecchymosis: Yes Induration: No Erythema: No Rash: No Hemosiderin Staining: No Scarring: No Mottled: No Pallor: No Moisture Rubor: No No Abnormalities Noted: No Dry / Scaly: No Temperature / Pain Maceration: No Temperature: No Abnormality Tenderness on Palpation: Yes Wound Preparation Ulcer Cleansing: Other: soap and water, Topical Anesthetic Applied: Other: lidocaine 4%, Treatment Notes Wound #1 (Right, Medial Malleolus) 1. Cleansed with: Cleanse wound with antibacterial soap and water 2. Anesthetic Topical Lidocaine 4% cream to wound bed prior to debridement 3. Peri-wound Care: Barrier cream 4. Dressing Applied: Aquacel Ag Other dressing (specify in notes) 7. Secured with 4-Layer Compression System - Right Lower Extremity Notes xtrasorb Electronic Signature(s) Signed: 07/06/2017 5:46:25 PM By: Montey Hora Entered By: Montey Hora on 07/06/2017 11:23:31 Hubka, Shandy L. ([id number]) -------------------------------------------------------------------------------- Vitals Details Patient Name: HARJAS, BIGGINS L. Date of Service: 07/06/2017 11:00 AM Medical Record Number: [id number] Patient Account Number: [id number] Date of Birth/Sex: [birth year] (61 y.o. Male) Treating RN: Montey Hora Primary Care Ules Marsala: Esmond Camper Other Clinician: Referring Tayana Shankle: Mingo Amber, JEFFREY Treating Daisy Mcneel/Extender: Tito Dine in Treatment: 0 Vital Signs Time Taken: 11:15 Temperature (F): 98.3 Height (in): 72 Pulse (bpm): 79 Weight (lbs): 245 Respiratory Rate (breaths/min): 18 Body Mass Index (BMI): 33.2 Blood Pressure (mmHg): 145/82 Reference Range: 80 - 120 mg / dl Electronic Signature(s) Signed: 07/06/2017 5:46:25 PM By: Montey Hora Entered By: Montey Hora on 07/06/2017 11:15:47

## 2017-07-14 ENCOUNTER — Encounter: Payer: 59 | Admitting: Internal Medicine

## 2017-07-14 DIAGNOSIS — E669 Obesity, unspecified: Secondary | ICD-10-CM | POA: Diagnosis not present

## 2017-07-14 DIAGNOSIS — Z6833 Body mass index (BMI) 33.0-33.9, adult: Secondary | ICD-10-CM | POA: Diagnosis not present

## 2017-07-14 DIAGNOSIS — I872 Venous insufficiency (chronic) (peripheral): Secondary | ICD-10-CM | POA: Diagnosis not present

## 2017-07-14 DIAGNOSIS — M25571 Pain in right ankle and joints of right foot: Secondary | ICD-10-CM | POA: Diagnosis not present

## 2017-07-14 DIAGNOSIS — E785 Hyperlipidemia, unspecified: Secondary | ICD-10-CM | POA: Diagnosis not present

## 2017-07-14 DIAGNOSIS — L97312 Non-pressure chronic ulcer of right ankle with fat layer exposed: Secondary | ICD-10-CM | POA: Diagnosis not present

## 2017-07-14 DIAGNOSIS — I10 Essential (primary) hypertension: Secondary | ICD-10-CM | POA: Diagnosis not present

## 2017-07-14 MED FILL — traMADol HCL 50 MG TABS: 50 | 8 days supply | Qty: 30 | Fill #0

## 2017-07-16 NOTE — Progress Notes (Signed)
Costley, Marvin L. ([id number]) Visit Report for 07/14/2017 Chief Complaint Document Details Patient Name: [name] [name], [name] [name]. Date of Service: 07/14/2017 8:45 AM Medical Record Number: [id number] Patient Account Number: [id number] Date of Birth/Sex: 1955-12-31 (61 y.o. Male) Treating RN: Carolyne Fiscal, Debi Primary Care Provider: Esmond Camper Other Clinician: Referring Provider: Esmond Camper Treating Provider/Extender: Tito Dine in Treatment: 2 Information Obtained from: Patient Chief Complaint Right Medial Ankle Ulcer Electronic Signature(s) Signed: 07/14/2017 6:25:53 PM By: Linton Ham MD Entered By: Linton Ham on 07/14/2017 09:27:43 Stodghill, Devante L. ([id number]) -------------------------------------------------------------------------------- HPI Details Patient Name: [name] [name], [name] Date of Service: 07/14/2017 8:45 AM Medical Record Number: [id number] Patient Account Number: [id number] Date of Birth/Sex: October 17, 1956 (61 y.o. Male) Treating RN: Carolyne Fiscal, Debi Primary Care Provider: Esmond Camper Other Clinician: Referring Provider: Mingo Amber, JEFFREY Treating Provider/Extender: Tito Dine in Treatment: 2 History of Present Illness HPI Description: 06/30/17 on evaluation today patient presents with a recurrent ulcer of the medial right ankle. He has had a region of atrophy and discoloration since he had vein strippin "years ago". Nonetheless she tells me at this point on evaluation today that this area reopened a 2 weeks ago and he initially contacted the [facility] wound center for an appointment but the appointment was so far out that he was unable to wait till that point. He therefore made an appointment here in [place] per the recommendation. He has been tolerating the wound from the standpoint of pain although he tells me it's not too painful. 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 He knows he should be using it however. Fortunately there's no evidence of infection in the wound appears to be doing well. 07/06/17; patient admitted to the clinic last week with a recurrent venous insufficiency ulcer on the medial right ankle. Fairly extensive open wound with surrounding erythematous discolored scan as well as small dilated veins inferiorly in the wound and medially in the foot. He describes tenderness actually lower down in the foot over these veins. His history goes back to vein stripping on the right leg 15 years ago. He was cared for in [place] when are sister clinic by Dr. Con Memos in 2016 at that time with a wound in the same spot. After he left the clinic he went on to laser ablation by [facility] I'll need to check these records. Per our intake nurse the wound looks better today. 07/14/17; patient complains of a lot of pain in the lower heel below the wound. There is discoloration here but certainly no different from last week. I did review the notes from Dr. Kellie Simmering he last saw him in November 2016 post successful laser ablation of the right greater saphenous vein. Was noted he had severe skin changes in that area even at that time Electronic Signature(s) Signed: 07/14/2017 6:25:53 PM By: Linton Ham MD Entered By: Linton Ham on 07/14/2017 09:29:30 Meany, Mehmet L. ([id number]) -------------------------------------------------------------------------------- Physical Exam Details Patient Name: [name] [name], [name] Date of Service: 07/14/2017 8:45 AM Medical Record Number: [id number] Patient Account Number: [id number] Date of Birth/Sex: [birth year] (61 y.o. Male) Treating RN: Carolyne Fiscal, Debi Primary Care Provider: Esmond Camper Other Clinician: Referring Provider: Mingo Amber, JEFFREY Treating Provider/Extender: Tito Dine in Treatment:  2 Constitutional Patient is hypertensive.. Pulse regular and within target range for patient.Marland Kitchen Respirations regular, non-labored and within  target range.. Temperature is normal and within the target range for the patient.Marland Kitchen appears in no distress. Respiratory Respiratory effort is easy and symmetric bilaterally. Rate is normal at rest and on room air.. Cardiovascular Pedal pulses palpable and strong bilaterally.. Edema is well controlled. Lymphatic None palpable in the popliteal or inguinal area. Integumentary (Hair, Skin) There is erythema around the large wound extending into his medial right heel. It is here he is tender the rest of it has no tenderness. Looking at the pictures this is unchanged from last week. Psychiatric No evidence of depression, anxiety, or agitation. Calm, cooperative, and communicative. Appropriate interactions and affect.. Notes Wound exam; the areas on the right medial ankle. There is some epithelialization in the center of thi 2 satellite lesions underneath s. No debridement was required. Continued chronic inflammation around the wound compatible with venous insufficiency. There is no overt evidence of cellulitis. Electronic Signature(s) Signed: 07/14/2017 6:25:53 PM By: Linton Ham MD Entered By: Linton Ham on 07/14/2017 09:33:11 Ehrman, [place] ([id number]) -------------------------------------------------------------------------------- Physician Orders Details Patient Name: [name] [name], [name] Date of Service: 07/14/2017 8:45 AM Medical Record Number: [id number] Patient Account Number: [id number] Date of Birth/Sex: [birth year] (61 y.o. Male) Treating RN: Carolyne Fiscal, Debi Primary Care Provider: Esmond Camper Other Clinician: Referring Provider: Mingo Amber, JEFFREY Treating Provider/Extender: Tito Dine in Treatment: 2 Verbal / Phone Orders: Yes Clinician: Pinkerton, Debi Read Back and Verified: Yes Diagnosis Coding Wound  Cleansing Wound #1 Right,Medial Malleolus o Clean wound with Normal Saline. Anesthetic Wound #1 Right,Medial Malleolus o Topical Lidocaine 4% cream applied to wound bed prior to debridement Skin Barriers/Peri-Wound Care Wound #1 Right,Medial Malleolus o Barrier cream o Moisturizing lotion o Triamcinolone Acetonide Ointment Primary Wound Dressing Wound #1 Right,Medial Malleolus o Aquacel Ag Secondary Dressing Wound #1 Right,Medial Malleolus o ABD pad o Dry Gauze o XtraSorb Dressing Change Frequency Wound #1 Right,Medial Malleolus o Change dressing every week Follow-up Appointments Wound #1 Right,Medial Malleolus o Return Appointment in 1 week. o Nurse Visit as needed Edema Control Wound #1 Right,Medial Malleolus [name] [name], [name] ([id number]) o 4-Layer Compression System - Right Lower Extremity - unna to anchor Additional Orders / Instructions Wound #1 Right,Medial Malleolus o Increase protein intake. o Other: - Please add vitamin A, vitamin C and zinc supplements to your diet Patient Medications Allergies: No Known Drug Allergies Notifications Medication Indication Start End tramadol right leg wound 07/14/2017 pain DOSE oral 50 mg tablet - 1 tablet oral q6h prn Electronic Signature(s) Signed: 07/14/2017 5:54:59 PM By: Alric Quan Signed: 07/14/2017 6:25:53 PM By: Linton Ham MD Previous Signature: 07/14/2017 9:38:43 AM Version By: Linton Ham MD Entered By: Alric Quan on 07/14/2017 10:22:14 [name] [name], [name] ([id number]) -------------------------------------------------------------------------------- Prescription 07/14/2017 Patient Name: [name] [name], ZAREMBA. Provider: Ricard Dillon MD Date of Birth: 1956/01/29 NPI#: [id number] Sex: Jerilynn Mages DEA#: [id number] Phone #: [phone] License #: [id number] Patient Address: [place], [place] [address] [facility] [address] [place], [place] [address] [phone] Allergies No Known Drug Allergies Medication Note: This prescription was automatically generated because the medication is a controlled substance, and cannot be prescribed electronically. Medication: Route: Strength: Form: tramadol 50 mg tablet oral 50 mg tablet Class: ANALGESICS, NARCOTICS Dose: Frequency / Time: Indication: 1 tablet oral q6h prn right leg wound pain Number of Refills: Number of Units: 0 Thirty (30) Tablet(s) Generic Substitution: Start Date: End Date: One Time Use: Substitution Permitted 0/76/2263 No Note to Pharmacy: Signature(s): Date(s): Emme,  Tyre L. ([id number]) Electronic Signature(s) Signed: 07/14/2017 5:54:59 PM By: Alric Quan Signed: 07/14/2017 6:25:53 PM By: Linton Ham MD Entered By: Alric Quan on 07/14/2017 10:22:15 [name] [name], [name] ([id number]) --------------------------------------------------------------------------------  Problem List Details Patient Name: BEAUMONT, AUSTAD L. Date of Service: 07/14/2017 8:45 AM Medical Record Number: [id number] Patient Account Number: [id number] Date of Birth/Sex: [birth year] (61 y.o. Male) Treating RN: Carolyne Fiscal, Debi Primary Care Provider: Esmond Camper Other Clinician: Referring Provider: Mingo Amber, JEFFREY Treating Provider/Extender: Tito Dine in Treatment: 2 Active Problems ICD-10 Encounter Code Description Active Date Diagnosis I87.2 Venous insufficiency (chronic) (peripheral) 06/30/2017 Yes L97.312 Non-pressure chronic ulcer of right ankle with fat layer 06/30/2017 Yes exposed [place] (primary) hypertension 06/30/2017 Yes Inactive Problems Resolved Problems Electronic Signature(s) Signed: 07/14/2017 6:25:53 PM By: Linton Ham MD Entered By: Linton Ham on 07/14/2017 09:27:16 [name] [name], [name]  ([id number]) -------------------------------------------------------------------------------- Progress Note Details Patient Name: [name] [name], [name] Date of Service: 07/14/2017 8:45 AM Medical Record Number: [id number] Patient Account Number: [id number] Date of Birth/Sex: 03/07/1956 (62 y.o. Male) Treating RN: Carolyne Fiscal, Debi Primary Care Provider: Esmond Camper Other Clinician: Referring Provider: Esmond Camper Treating Provider/Extender: Tito Dine in Treatment: 2 Subjective Chief Complaint Information obtained from Patient Right Medial Ankle Ulcer History of Present Illness (HPI) 06/30/17 on evaluation today patient presents with a recurrent ulcer of the medial right ankle. He has had a region of atrophy and discoloration since he had vein strippin "years ago". Nonetheless she tells me at this point on evaluation today that this area reopened a 2 weeks ago and he initially contacted the [facility] wound center for an appointment but the appointment was so far out that he was unable to wait till that point. He therefore made an appointment here in [place] per the recommendation. He has been tolerating the wound from the standpoint of pain although he tells me it's not too painful. 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 He knows he should be using it however. Fortunately there's no evidence of infection in the wound appears to be doing well. 07/06/17; patient admitted to the clinic last week with a recurrent venous insufficiency ulcer on the medial right ankle. Fairly extensive open wound with surrounding erythematous discolored scan as well as small dilated veins inferiorly in the wound and medially in the foot. He describes tenderness actually lower down in the foot over these veins. His history goes back to vein stripping on the  right leg 15 years ago. He was cared for in [place] when are sister clinic by Dr. Con Memos in 2016 at that time with a wound in the same spot. After he left the clinic he went on to laser [facility] I'll need to check these records. Per our intake nurse the wound looks better today. 07/14/17; patient complains of a lot of pain in the lower heel below the wound. There is discoloration here but certainly no different from last week. I did review the notes from Dr. Kellie Simmering he last saw him in November 2016 post successful laser ablation of the right greater saphenous vein. Was noted he had severe skin changes in that area even at that time Objective Constitutional Patient is hypertensive.. Pulse regular and within target range for patient.Marland Kitchen Respirations regular, non-labored [name] [name], [name] ([id number]) and within target range.. Temperature is normal and  within the target range for the patient.Marland Kitchen appears in no distress. Vitals Time Taken: 8:56 AM, Height: 72 in, Weight: 245 lbs, BMI: 33.2, Temperature: 97.8 F, Pulse: 72 bpm, Respiratory Rate: 18 breaths/min, Blood Pressure: 150/86 mmHg. Respiratory Respiratory effort is easy and symmetric bilaterally. Rate is normal at rest and on room air.. Cardiovascular Pedal pulses palpable and strong bilaterally.. Edema is well controlled. Lymphatic None palpable in the popliteal or inguinal area. Psychiatric No evidence of depression, anxiety, or agitation. Calm, cooperative, and communicative. Appropriate interactions and affect.. General Notes: Wound exam; the areas on the right medial ankle. There is some epithelialization in the center of thi 2 satellite lesions underneath s. No debridement was required. Continued chronic inflammation around the wound compatible with venous insufficiency. There is no overt evidence of cellulitis. Integumentary (Hair, Skin) There is erythema around the large wound extending into  his medial right heel. It is here he is tender the rest of it has no tenderness. Looking at the pictures this is unchanged from last week. Wound #1 status is Open. Original cause of wound was Gradually Appeared. The wound is located on the Right,Medial Malleolus. The wound measures 6.5cm length x 3.5cm width x 0.2cm depth; 17.868cm^2 area and 3.574cm^3 volume. There is no tunneling or undermining noted. There is a large amount of sanguinous drainage noted. The wound margin is flat and intact. There is large (67-100%) pink granulation within the wound bed. There is a small (1-33%) amount of necrotic tissue within the wound bed including Adherent Slough. The periwound skin appearance exhibited: Maceration, Ecchymosis, Erythema. The periwound skin appearance did not exhibit: Callus, Crepitus, Excoriation, Induration, Rash, Scarring, Dry/Scaly, Atrophie Blanche, Cyanosis, Hemosiderin Staining, Mottled, Pallor, Rubor. The surrounding wound skin color is noted with erythema which is circumferential. Periwound temperature was noted as No Abnormality. The periwound has tenderness on palpation. Assessment Active Problems ICD-10 I87.2 - Venous insufficiency (chronic) (peripheral) L97.312 - Non-pressure chronic ulcer of right ankle with fat layer exposed [name] [name], [name] ([id number]) I10 - Essential (primary) hypertension Plan Wound Cleansing: Wound #1 Right,Medial Malleolus: Clean wound with Normal Saline. Anesthetic: Wound #1 Right,Medial Malleolus: Topical Lidocaine 4% cream applied to wound bed prior to debridement Skin Barriers/Peri-Wound Care: Wound #1 Right,Medial Malleolus: Barrier cream Moisturizing lotion Triamcinolone Acetonide Ointment Primary Wound Dressing: Wound #1 Right,Medial Malleolus: Aquacel Ag Secondary Dressing: Wound #1 Right,Medial Malleolus: ABD pad Dry Gauze XtraSorb Dressing Change Frequency: Wound #1 Right,Medial Malleolus: Change dressing every  week Follow-up Appointments: Wound #1 Right,Medial Malleolus: Return Appointment in 1 week. Nurse Visit as needed Edema Control: Wound #1 Right,Medial Malleolus: 4-Layer Compression System - Right Lower Extremity - unna to anchor Additional Orders / Instructions: Wound #1 Right,Medial Malleolus: Increase protein intake. Other: - Please add vitamin A, vitamin C and zinc supplements to your diet The following medication(s) was prescribed: tramadol oral 50 mg tablet 1 tablet oral q6h prn for right leg wound pain starting 07/14/2017 [name] [name], Linkyn L. ([id number]) o #1 continue silver alginate to the wound. We seem to improve done better from last week some epithelialization #2 after review with the previous pictures it is clear he has severe chronic venous inflammation, I don't believe there is cellulitis here. #3 all give him some Ultram for discomfort. He is on his feet all day which doesn't help although his edema is controlled #4 it is reasonable to go back to see Dr. Kellie Simmering to make sure there is an additional reflux that would benefit intervention Electronic Signature(s) Signed: 07/14/2017 2:07:14 PM By:  Linton Ham MD Previous Signature: 07/14/2017 9:39:06 AM Version By: Linton Ham MD Entered By: Linton Ham on 07/14/2017 14:07:14 [place], [place] ([id number]) -------------------------------------------------------------------------------- SuperBill Details Patient Name: LUMAN, HOLWAY L. Date of Service: 07/14/2017 Medical Record Number: [id number] Patient Account Number: [id number] Date of Birth/Sex: 18-Oct-1956 (61 y.o. Male) Treating RN: Carolyne Fiscal, Debi Primary Care Provider: Esmond Camper Other Clinician: Referring Provider: Mingo Amber, JEFFREY Treating Provider/Extender: Tito Dine in Treatment: 2 Diagnosis Coding ICD-10 Codes Code Description I87.2 Venous insufficiency (chronic) (peripheral) L97.312 Non-pressure chronic ulcer of right ankle with  fat layer exposed [place] (primary) hypertension Facility Procedures CPT4: Description Modifier Quantity Code [id number] (Facility Use Only) [phone] - APPLY [place] RT 1 LEG Physician Procedures CPT4 Code Description: [id number] - WC PHYS LEVEL 3 - EST PT ICD-10 Description Diagnosis I87.2 Venous insufficiency (chronic) (peripheral) L97.312 Non-pressure chronic ulcer of right ankle with fat Modifier: layer expose Quantity: 1 d Electronic Signature(s) Signed: 07/14/2017 5:54:59 PM By: Alric Quan Signed: 07/14/2017 6:25:53 PM By: Linton Ham MD Entered By: Alric Quan on 07/14/2017 10:22:44

## 2017-07-16 NOTE — Progress Notes (Signed)
Daughtrey, Alecsander L. ([id number]) Visit Report for 07/14/2017 Arrival Information Details Patient Name: DORIS, MCGILVERY. Date of Service: 07/14/2017 8:45 AM Medical Record Number: [id number] Patient Account Number: [id number] Date of Birth/Sex: Jun 09, 1956 (61 y.o. Male) Treating RN: Carolyne Fiscal, Debi Primary Care Nathaly Dawkins: Mingo Amber, JEFFREY Other Clinician: Referring Mertice Uffelman: Mingo Amber, JEFFREY Treating Hamed Debella/Extender: Tito Dine in Treatment: 2 Visit Information History Since Last Visit All ordered tests and consults were completed: No Patient Arrived: Ambulatory Added or deleted any medications: No Arrival Time: 08:55 Any new allergies or adverse reactions: No Accompanied By: self Had a fall or experienced change in No Transfer Assistance: None activities of daily living that may affect Patient Identification Verified: Yes risk of falls: Secondary Verification Process Yes Signs or symptoms of abuse/neglect since last No Completed: visito Patient Requires Transmission-Based No Hospitalized since last visit: No Precautions: Has Dressing in Place as Prescribed: Yes Patient Has Alerts: No Has Compression in Place as Prescribed: Yes Pain Present Now: Yes Electronic Signature(s) Signed: 07/14/2017 5:54:59 PM By: Alric Quan Entered By: Alric Quan on 07/14/2017 08:56:15 Luscher, Ishan L. ([id number]) -------------------------------------------------------------------------------- Encounter Discharge Information Details Patient Name: FREDRIK, MOGEL. Date of Service: 07/14/2017 8:45 AM Medical Record Number: [id number] Patient Account Number: [id number] Date of Birth/Sex: 1956-10-28 (61 y.o. Male) Treating RN: Carolyne Fiscal, Debi Primary Care Janann Boeve: Esmond Camper Other Clinician: Referring Havanna Groner: Mingo Amber, JEFFREY Treating Lynden Flemmer/Extender: Tito Dine in Treatment: 2 Encounter Discharge Information Items Discharge Pain Level: 6 Discharge  Condition: Stable Ambulatory Status: Ambulatory Discharge Destination: Home Transportation: Private Auto Accompanied By: self Schedule Follow-up Appointment: Yes Medication Reconciliation completed and provided to Patient/Care No Sweet Jarvis: Provided on Clinical Summary of Care: 07/14/2017 Form Type Recipient Paper Patient RM Electronic Signature(s) Signed: 07/14/2017 9:40:40 AM By: Ruthine Dose Entered By: Ruthine Dose on 07/14/2017 09:40:40 Barasch, Dois L. ([id number]) -------------------------------------------------------------------------------- Lower Extremity Assessment Details Patient Name: SHIHAB, [place]. Date of Service: 07/14/2017 8:45 AM Medical Record Number: [id number] Patient Account Number: [id number] Date of Birth/Sex: March 26, 1956 (61 y.o. Male) Treating RN: Carolyne Fiscal, Debi Primary Care Nakesha Ebrahim: Mingo Amber, JEFFREY Other Clinician: Referring Kaleisha Bhargava: Mingo Amber, JEFFREY Treating Abella Shugart/Extender: Ricard Dillon Weeks in Treatment: 2 Edema Assessment Assessed: [Left: No] [Right: No] E[Left: dema] [Right: :] Calf Left: Right: Point of Measurement: 36 cm From Medial Instep cm 41 cm Ankle Left: Right: Point of Measurement: 12 cm From Medial Instep cm 26 cm Vascular Assessment Pulses: Dorsalis Pedis Palpable: [Right:Yes] Posterior Tibial Extremity colors, hair growth, and conditions: Extremity Color: [Right:Hyperpigmented] Temperature of Extremity: [Right:Warm] Capillary Refill: [Right:< 3 seconds] Toe Nail Assessment Left: Right: Thick: No Discolored: No Deformed: No Improper Length and Hygiene: No Electronic Signature(s) Signed: 07/14/2017 5:54:59 PM By: Alric Quan Entered By: Alric Quan on 07/14/2017 09:13:11 Reindl, Jakye L. ([id number]) -------------------------------------------------------------------------------- Multi Wound Chart Details Patient Name: YURIEL, LOPEZMARTINEZ L. Date of Service: 07/14/2017 8:45 AM Medical Record Number:  [id number] Patient Account Number: [id number] Date of Birth/Sex: 1956/07/06 (61 y.o. Male) Treating RN: Carolyne Fiscal, Debi Primary Care Lucrecia Mcphearson: Esmond Camper Other Clinician: Referring Romey Cohea: Mingo Amber, JEFFREY Treating Dameion Briles/Extender: Tito Dine in Treatment: 2 Vital Signs Height(in): 72 Pulse(bpm): 72 Weight(lbs): 245 Blood Pressure 150/86 (mmHg): Body Mass Index(BMI): 33 Temperature(F): 97.8 Respiratory Rate 18 (breaths/min): Photos: [1:No Photos] [N/A:N/A] Wound Location: [1:Right Malleolus - Medial] [N/A:N/A] Wounding Event: [1:Gradually Appeared] [N/A:N/A] Primary Etiology: [1:Venous Leg Ulcer] [N/A:N/A] Comorbid History: [1:Hypertension] [N/A:N/A] Date Acquired: [1:06/15/2017] [N/A:N/A] Weeks of Treatment: [1:2] [N/A:N/A] Wound Status: [1:Open] [N/A:N/A] Measurements L x W x D 6.5x3.5x0.2 [N/A:N/A] (cm) Area (cm) : [1:17.868] [  N/A:N/A] Volume (cm) : [1:3.574] [N/A:N/A] % Reduction in Area: [1:-65.30%] [N/A:N/A] % Reduction in Volume: -230.60% [N/A:N/A] Classification: [1:Full Thickness Without Exposed Support Structures] [N/A:N/A] Exudate Amount: [1:Large] [N/A:N/A] Exudate Type: [1:Sanguinous] [N/A:N/A] Exudate Color: [1:red] [N/A:N/A] Wound Margin: [1:Flat and Intact] [N/A:N/A] Granulation Amount: [1:Large (67-100%)] [N/A:N/A] Granulation Quality: [1:Pink] [N/A:N/A] Necrotic Amount: [1:Small (1-33%)] [N/A:N/A] Exposed Structures: [1:Fascia: No Fat Layer (Subcutaneous Tissue) Exposed: No Tendon: No Muscle: No] [N/A:N/A] Joint: No Bone: No Epithelialization: None N/A N/A Periwound Skin Texture: Excoriation: No N/A N/A Induration: No Callus: No Crepitus: No Rash: No Scarring: No Periwound Skin Maceration: Yes N/A N/A Moisture: Dry/Scaly: No Periwound Skin Color: Ecchymosis: Yes N/A N/A Erythema: Yes Atrophie Blanche: No Cyanosis: No Hemosiderin Staining: No Mottled: No Pallor: No Rubor: No Erythema Location: Circumferential N/A  N/A Temperature: No Abnormality N/A N/A Tenderness on Yes N/A N/A Palpation: Wound Preparation: Ulcer Cleansing: Other: N/A N/A soap and water Topical Anesthetic Applied: Other: lidocaine 4% Treatment Notes Electronic Signature(s) Signed: 07/14/2017 6:25:53 PM By: Linton Ham MD Entered By: Linton Ham on 07/14/2017 09:27:27 Hagwood, Lavalle L. ([id number]) -------------------------------------------------------------------------------- [place] Details Patient Name: RYAAN, VANWAGONER. Date of Service: 07/14/2017 8:45 AM Medical Record Number: [id number] Patient Account Number: [id number] Date of Birth/Sex: 06-Dec-1956 (61 y.o. Male) Treating RN: Carolyne Fiscal, Debi Primary Care Allesandra Huebsch: Esmond Camper Other Clinician: Referring Lennon Richins: Mingo Amber, JEFFREY Treating Ann Groeneveld/Extender: Tito Dine in Treatment: 2 Active Inactive ` Abuse / Safety / Falls / Self Care Management Nursing Diagnoses: Potential for falls Goals: Patient will remain injury free related to falls Date Initiated: 06/30/2017 Target Resolution Date: 08/20/2017 Goal Status: Active Interventions: Assess fall risk on admission and as needed Notes: ` Orientation to the Wound Care Program Nursing Diagnoses: Knowledge deficit related to the wound healing center program Goals: Patient/caregiver will verbalize understanding of the [place] Date Initiated: 06/30/2017 Target Resolution Date: 08/20/2017 Goal Status: Active Interventions: Provide education on orientation to the wound center Notes: ` Wound/Skin Impairment Nursing Diagnoses: Impaired tissue integrity Mosso, Hakim L. ([id number]) Goals: Ulcer/skin breakdown will have a volume reduction of 30% by week 4 Date Initiated: 06/30/2017 Target Resolution Date: 08/20/2017 Goal Status: Active Ulcer/skin breakdown will have a volume reduction of 50% by week 8 Date Initiated: 06/30/2017 Target  Resolution Date: 08/20/2017 Goal Status: Active Ulcer/skin breakdown will have a volume reduction of 80% by week 12 Date Initiated: 06/30/2017 Target Resolution Date: 08/20/2017 Goal Status: Active Ulcer/skin breakdown will heal within 14 weeks Date Initiated: 06/30/2017 Target Resolution Date: 08/20/2017 Goal Status: Active Interventions: Assess patient/caregiver ability to obtain necessary supplies Assess patient/caregiver ability to perform ulcer/skin care regimen upon admission and as needed Assess ulceration(s) every visit Notes: Electronic Signature(s) Signed: 07/14/2017 5:54:59 PM By: Alric Quan Entered By: Alric Quan on 07/14/2017 09:13:59 Shave, Nakoa L. ([id number]) -------------------------------------------------------------------------------- Pain Assessment Details Patient Name: DEMARLO, RIOJAS. Date of Service: 07/14/2017 8:45 AM Medical Record Number: [id number] Patient Account Number: [id number] Date of Birth/Sex: Nov 01, 1956 (61 y.o. Male) Treating RN: Carolyne Fiscal, Debi Primary Care Ilisa Hayworth: Esmond Camper Other Clinician: Referring Terrianna Holsclaw: Mingo Amber, JEFFREY Treating Sharalee Witman/Extender: Tito Dine in Treatment: 2 Active Problems Location of Pain Severity and Description of Pain Patient Has Paino Yes Site Locations Pain Location: Pain in Ulcers Rate the pain. Current Pain Level: 10 Character of Pain Describe the Pain: Burning, Tender, Throbbing Pain Management and Medication Current Pain Management: Electronic Signature(s) Signed: 07/14/2017 5:54:59 PM By: Alric Quan Entered By: Alric Quan on 07/14/2017 08:56:45 Eber, Jeovanni L. ([id number]) -------------------------------------------------------------------------------- Patient/Caregiver Education  Details Patient Name: LOC, FEINSTEIN. Date of Service: 07/14/2017 8:45 AM Medical Record Number: [id number] Patient Account Number: [id number] Date of Birth/Gender: [birth year]  (61 y.o. Male) Treating RN: Carolyne Fiscal, Debi Primary Care Physician: Esmond Camper Other Clinician: Referring Physician: Esmond Camper Treating Physician/Extender: Tito Dine in Treatment: 2 Education Assessment Education Provided To: Patient Education Topics Provided Wound/Skin Impairment: Handouts: Other: change dressing as ordered Methods: Demonstration, Explain/Verbal Responses: State content correctly Electronic Signature(s) Signed: 07/14/2017 5:54:59 PM By: Alric Quan Entered By: Alric Quan on 07/14/2017 09:15:51 Hashman, Gianfranco L. ([id number]) -------------------------------------------------------------------------------- Wound Assessment Details Patient Name: AMIER, HOYT L. Date of Service: 07/14/2017 8:45 AM Medical Record Number: [id number] Patient Account Number: [id number] Date of Birth/Sex: Apr 05, 1956 (61 y.o. Male) Treating RN: Carolyne Fiscal, Debi Primary Care Laurie Lovejoy: Esmond Camper Other Clinician: Referring Lera Gaines: Mingo Amber, JEFFREY Treating Allianna Beaubien/Extender: Ricard Dillon Weeks in Treatment: 2 Wound Status Wound Number: 1 Primary Etiology: Venous Leg Ulcer Wound Location: Right Malleolus - Medial Wound Status: Open Wounding Event: Gradually Appeared Comorbid History: Hypertension Date Acquired: 06/15/2017 Weeks Of Treatment: 2 Clustered Wound: No Photos Photo Uploaded By: Alric Quan on 07/14/2017 13:25:33 Wound Measurements Length: (cm) 6.5 Width: (cm) 3.5 Depth: (cm) 0.2 Area: (cm) 17.868 Volume: (cm) 3.574 % Reduction in Area: -65.3% % Reduction in Volume: -230.6% Epithelialization: None Tunneling: No Undermining: No Wound Description Full Thickness Without Exposed Classification: Support Structures Wound Margin: Flat and Intact Exudate Large Amount: Exudate Type: Sanguinous Exudate Color: red Foul Odor After Cleansing: No Slough/Fibrino Yes Wound Bed Granulation Amount: Large (67-100%)  Exposed Structure Granulation Quality: Pink Fascia Exposed: No Necrotic Amount: Small (1-33%) Fat Layer (Subcutaneous Tissue) Exposed: No Evola, Hoang L. ([id number]) Necrotic Quality: Adherent Slough Tendon Exposed: No Muscle Exposed: No Joint Exposed: No Bone Exposed: No Periwound Skin Texture Texture Color No Abnormalities Noted: No No Abnormalities Noted: No Callus: No Atrophie Blanche: No Crepitus: No Cyanosis: No Excoriation: No Ecchymosis: Yes Induration: No Erythema: Yes Rash: No Erythema Location: Circumferential Scarring: No Hemosiderin Staining: No Mottled: No Moisture Pallor: No No Abnormalities Noted: No Rubor: No Dry / Scaly: No Maceration: Yes Temperature / Pain Temperature: No Abnormality Tenderness on Palpation: Yes Wound Preparation Ulcer Cleansing: Other: soap and water, Topical Anesthetic Applied: Other: lidocaine 4%, Treatment Notes Wound #1 (Right, Medial Malleolus) 1. Cleansed with: Clean wound with Normal Saline Cleanse wound with antibacterial soap and water 2. Anesthetic Topical Lidocaine 4% cream to wound bed prior to debridement 3. Peri-wound Care: Barrier cream Moisturizing lotion Other peri-wound care (specify in notes) 4. Dressing Applied: Aquacel Ag 5. Secondary Dressing Applied ABD Pad Dry Gauze 7. Secured with Tape 4-Layer Compression System - Right Lower Extremity Notes xtrasorb, unna to anchor, TCA Riedesel, Rahmel L. ([id number]) Electronic Signature(s) Signed: 07/14/2017 5:54:59 PM By: Alric Quan Entered By: Alric Quan on 07/14/2017 09:10:24 Salm, Banner ([id number]) -------------------------------------------------------------------------------- Vitals Details Patient Name: KRISHNA, DANCEL L. Date of Service: 07/14/2017 8:45 AM Medical Record Number: [id number] Patient Account Number: [id number] Date of Birth/Sex: [birth year] (61 y.o. Male) Treating RN: Carolyne Fiscal, Debi Primary Care Ildefonso Keaney:  Esmond Camper Other Clinician: Referring Torey Reinard: Mingo Amber, JEFFREY Treating Keonta Monceaux/Extender: Tito Dine in Treatment: 2 Vital Signs Time Taken: 08:56 Temperature (F): 97.8 Height (in): 72 Pulse (bpm): 72 Weight (lbs): 245 Respiratory Rate (breaths/min): 18 Body Mass Index (BMI): 33.2 Blood Pressure (mmHg): 150/86 Reference Range: 80 - 120 mg / dl Electronic Signature(s) Signed: 07/14/2017 5:54:59 PM By: Alric Quan Entered By: Alric Quan on 07/14/2017 08:58:43

## 2017-07-21 ENCOUNTER — Encounter: Payer: 59 | Attending: Internal Medicine | Admitting: Internal Medicine

## 2017-07-21 DIAGNOSIS — I10 Essential (primary) hypertension: Secondary | ICD-10-CM | POA: Diagnosis not present

## 2017-07-21 DIAGNOSIS — I872 Venous insufficiency (chronic) (peripheral): Secondary | ICD-10-CM | POA: Insufficient documentation

## 2017-07-21 DIAGNOSIS — L97312 Non-pressure chronic ulcer of right ankle with fat layer exposed: Secondary | ICD-10-CM | POA: Insufficient documentation

## 2017-07-24 NOTE — Progress Notes (Signed)
Baum, Donnel L. ([id number]) Visit Report for 07/21/2017 Arrival Information Details Patient Name: [name] [name], [name] Date of Service: 07/21/2017 8:00 AM Medical Record Number: [id number] Patient Account Number: [id number] Date of Birth/Sex: 08-10-1956 (60 y.o. Male) Treating RN: Carolyne Fiscal, Debi Primary Care Adasyn Mcadams: Mingo Amber, JEFFREY Other Clinician: Referring Velisa Regnier: Mingo Amber, JEFFREY Treating Irianna Gilday/Extender: Tito Dine in Treatment: 3 Visit Information History Since Last Visit All ordered tests and consults were completed: No Patient Arrived: Ambulatory Added or deleted any medications: No Arrival Time: 08:03 Any new allergies or adverse reactions: No Accompanied By: self Had a fall or experienced change in No Transfer Assistance: None activities of daily living that may affect Patient Identification Verified: Yes risk of falls: Secondary Verification Process Yes Signs or symptoms of abuse/neglect since last No Completed: visito Patient Requires Transmission-Based No Hospitalized since last visit: No Precautions: Has Dressing in Place as Prescribed: Yes Patient Has Alerts: No Has Compression in Place as Prescribed: Yes Pain Present Now: No Electronic Signature(s) Signed: 07/22/2017 4:42:53 PM By: Alric Quan Entered By: Alric Quan on 07/21/2017 08:05:19 Oshita, Wyett L. ([id number]) -------------------------------------------------------------------------------- Encounter Discharge Information Details Patient Name: [name] [name], [name] Date of Service: 07/21/2017 8:00 AM Medical Record Number: [id number] Patient Account Number: [id number] Date of Birth/Sex: 08-29-1956 (61 y.o. Male) Treating RN: Carolyne Fiscal, Debi Primary Care Deavion Dobbs: Esmond Camper Other Clinician: Referring Harrold Fitchett: Mingo Amber, JEFFREY Treating Chrystal Zeimet/Extender: Tito Dine in Treatment: 3 Encounter Discharge Information Items Discharge Pain Level: 0 Discharge  Condition: Stable Ambulatory Status: Ambulatory Discharge Destination: Home Transportation: Private Auto Accompanied By: self Schedule Follow-up Appointment: Yes Medication Reconciliation completed and provided to Patient/Care No Rashauna Tep: Provided on Clinical Summary of Care: 07/21/2017 Form Type Recipient Paper Patient RM Electronic Signature(s) Signed: 07/21/2017 8:43:25 AM By: Ruthine Dose Entered By: Ruthine Dose on 07/21/2017 08:43:25 Locey, Azarian L. ([id number]) -------------------------------------------------------------------------------- Lower Extremity Assessment Details Patient Name: [name] [name], [name] Date of Service: 07/21/2017 8:00 AM Medical Record Number: [id number] Patient Account Number: [id number] Date of Birth/Sex: 12/08/1956 (61 y.o. Male) Treating RN: Carolyne Fiscal, Debi Primary Care Emory Leaver: Mingo Amber, JEFFREY Other Clinician: Referring Metha Kolasa: Mingo Amber, JEFFREY Treating Deontae Robson/Extender: Tito Dine in Treatment: 3 Edema Assessment Assessed: [Left: No] [Right: No] E[Left: dema] [Right: :] Calf Left: Right: Point of Measurement: 36 cm From Medial Instep cm 41 cm Ankle Left: Right: Point of Measurement: 12 cm From Medial Instep cm 26 cm Vascular Assessment Pulses: Dorsalis Pedis Palpable: [Right:Yes] Posterior Tibial Extremity colors, hair growth, and conditions: Extremity Color: [Right:Normal] Temperature of Extremity: [Right:Warm] Capillary Refill: [Right:< 3 seconds] Toe Nail Assessment Left: Right: Thick: No Discolored: No Deformed: No Improper Length and Hygiene: No Electronic Signature(s) Signed: 07/22/2017 4:42:53 PM By: Alric Quan Entered By: Alric Quan on 07/21/2017 08:17:25 Dehart, Gaje L. ([id number]) -------------------------------------------------------------------------------- Multi Wound Chart Details Patient Name: [name] [name], [name] Date of Service: 07/21/2017 8:00 AM Medical Record Number:  [id number] Patient Account Number: [id number] Date of Birth/Sex: Jun 10, 1956 (61 y.o. Male) Treating RN: Carolyne Fiscal, Debi Primary Care Shamera Yarberry: Esmond Camper Other Clinician: Referring Cortlan Dolin: Mingo Amber, JEFFREY Treating Savannah Morford/Extender: Tito Dine in Treatment: 3 Vital Signs Height(in): 72 Pulse(bpm): 76 Weight(lbs): 245 Blood Pressure 152/78 (mmHg): Body Mass Index(BMI): 33 Temperature(F): 97.9 Respiratory Rate 18 (breaths/min): Photos: [1:No Photos] [N/A:N/A] Wound Location: [1:Right Malleolus - Medial] [N/A:N/A] Wounding Event: [1:Gradually Appeared] [N/A:N/A] Primary Etiology: [1:Venous Leg Ulcer] [N/A:N/A] Comorbid History: [1:Hypertension] [N/A:N/A] Date Acquired: [1:06/15/2017] [N/A:N/A] Weeks of Treatment: [1:3] [N/A:N/A] Wound Status: [1:Open] [N/A:N/A] Measurements L x W x D 6.5x3x0.2 [N/A:N/A] (cm) Area (cm) : [1:15.315] [  N/A:N/A] Volume (cm) : [1:3.063] [N/A:N/A] % Reduction in Area: [1:-41.70%] [N/A:N/A] % Reduction in Volume: -183.30% [N/A:N/A] Classification: [1:Full Thickness Without Exposed Support Structures] [N/A:N/A] Exudate Amount: [1:Large] [N/A:N/A] Exudate Type: [1:Serosanguineous] [N/A:N/A] Exudate Color: [1:red, brown] [N/A:N/A] Wound Margin: [1:Flat and Intact] [N/A:N/A] Granulation Amount: [1:Large (67-100%)] [N/A:N/A] Granulation Quality: [1:Pink] [N/A:N/A] Necrotic Amount: [1:Small (1-33%)] [N/A:N/A] Exposed Structures: [1:Fascia: No Fat Layer (Subcutaneous Tissue) Exposed: No Tendon: No Muscle: No] [N/A:N/A] Joint: No Bone: No Epithelialization: None N/A N/A Periwound Skin Texture: Excoriation: No N/A N/A Induration: No Callus: No Crepitus: No Rash: No Scarring: No Periwound Skin Maceration: Yes N/A N/A Moisture: Dry/Scaly: No Periwound Skin Color: Ecchymosis: Yes N/A N/A Erythema: Yes Atrophie Blanche: No Cyanosis: No Hemosiderin Staining: No Mottled: No Pallor: No Rubor: No Erythema Location:  Circumferential N/A N/A Temperature: No Abnormality N/A N/A Tenderness on Yes N/A N/A Palpation: Wound Preparation: Ulcer Cleansing: Other: N/A N/A soap and water Topical Anesthetic Applied: Other: lidocaine 4% Treatment Notes Electronic Signature(s) Signed: 07/22/2017 3:50:17 PM By: Linton Ham MD Entered By: Linton Ham on 07/21/2017 08:30:08 Comp, Prue ([id number]) -------------------------------------------------------------------------------- [place] Details Patient Name: [name] [name], COLDEN. Date of Service: 07/21/2017 8:00 AM Medical Record Number: [id number] Patient Account Number: [id number] Date of Birth/Sex: 1956-01-20 (61 y.o. Male) Treating RN: Carolyne Fiscal, Debi Primary Care Marshun Duva: Esmond Camper Other Clinician: Referring Deshun Sedivy: Mingo Amber, JEFFREY Treating Jolene Guyett/Extender: Tito Dine in Treatment: 3 Active Inactive ` Abuse / Safety / Falls / Self Care Management Nursing Diagnoses: Potential for falls Goals: Patient will remain injury free related to falls Date Initiated: 06/30/2017 Target Resolution Date: 08/20/2017 Goal Status: Active Interventions: Assess fall risk on admission and as needed Notes: ` Orientation to the Wound Care Program Nursing Diagnoses: Knowledge deficit related to the wound healing center program Goals: Patient/caregiver will verbalize understanding of the [place] Date Initiated: 06/30/2017 Target Resolution Date: 08/20/2017 Goal Status: Active Interventions: Provide education on orientation to the wound center Notes: ` Wound/Skin Impairment Nursing Diagnoses: Impaired tissue integrity [name] [name], [name] ([id number]) Goals: Ulcer/skin breakdown will have a volume reduction of 30% by week 4 Date Initiated: 06/30/2017 Target Resolution Date: 08/20/2017 Goal Status: Active Ulcer/skin breakdown will have a volume reduction of 50% by week 8 Date Initiated:  06/30/2017 Target Resolution Date: 08/20/2017 Goal Status: Active Ulcer/skin breakdown will have a volume reduction of 80% by week 12 Date Initiated: 06/30/2017 Target Resolution Date: 08/20/2017 Goal Status: Active Ulcer/skin breakdown will heal within 14 weeks Date Initiated: 06/30/2017 Target Resolution Date: 08/20/2017 Goal Status: Active Interventions: Assess patient/caregiver ability to obtain necessary supplies Assess patient/caregiver ability to perform ulcer/skin care regimen upon admission and as needed Assess ulceration(s) every visit Notes: Electronic Signature(s) Signed: 07/22/2017 4:42:53 PM By: Alric Quan Entered By: Alric Quan on 07/21/2017 08:18:28 [name] [name], [name] ([id number]) -------------------------------------------------------------------------------- Pain Assessment Details Patient Name: [name] [name], HOLLINGS. Date of Service: 07/21/2017 8:00 AM Medical Record Number: [id number] Patient Account Number: [id number] Date of Birth/Sex: Feb 24, 1956 (61 y.o. Male) Treating RN: Carolyne Fiscal, Debi Primary Care Audryna Wendt: Esmond Camper Other Clinician: Referring Roshan Salamon: Mingo Amber, JEFFREY Treating Indio Santilli/Extender: Tito Dine in Treatment: 3 Active Problems Location of Pain Severity and Description of Pain Patient Has Paino No Site Locations With Dressing Change: No Pain Management and Medication Current Pain Management: Electronic Signature(s) Signed: 07/22/2017 4:42:53 PM By: Alric Quan Entered By: Alric Quan on 07/21/2017 08:05:26 [name] [name], [name] ([id number]) -------------------------------------------------------------------------------- Patient/Caregiver Education Details Patient Name: [name] [name], VUOLO L. Date of Service: 07/21/2017 8:00 AM Medical Record Number: [id number]  Patient Account Number: [id number] Date of Birth/Gender: 04/28/1956 (61 y.o. Male) Treating RN: Carolyne Fiscal, Debi Primary Care Physician: Esmond Camper Other  Clinician: Referring Physician: Esmond Camper Treating Physician/Extender: Tito Dine in Treatment: 3 Education Assessment Education Provided To: Patient Education Topics Provided Wound/Skin Impairment: Handouts: Other: change dressing as ordered Methods: Demonstration, Explain/Verbal Responses: State content correctly Electronic Signature(s) Signed: 07/22/2017 4:42:53 PM By: Alric Quan Entered By: Alric Quan on 07/21/2017 08:19:19 [name] [name], [name] ([id number]) -------------------------------------------------------------------------------- Wound Assessment Details Patient Name: [name] [name], [name] Date of Service: 07/21/2017 8:00 AM Medical Record Number: [id number] Patient Account Number: [id number] Date of Birth/Sex: January 31, 1956 (61 y.o. Male) Treating RN: Carolyne Fiscal, Debi Primary Care Cayleigh Paull: Esmond Camper Other Clinician: Referring Pritika Alvarez: Mingo Amber, JEFFREY Treating Carsen Machi/Extender: Ricard Dillon Weeks in Treatment: 3 Wound Status Wound Number: 1 Primary Etiology: Venous Leg Ulcer Wound Location: Right Malleolus - Medial Wound Status: Open Wounding Event: Gradually Appeared Comorbid History: Hypertension Date Acquired: 06/15/2017 Weeks Of Treatment: 3 Clustered Wound: No Photos Photo Uploaded By: Alric Quan on 07/21/2017 11:48:02 Wound Measurements Length: (cm) 6.5 Width: (cm) 3 Depth: (cm) 0.2 Area: (cm) 15.315 Volume: (cm) 3.063 % Reduction in Area: -41.7% % Reduction in Volume: -183.3% Epithelialization: None Tunneling: No Undermining: No Wound Description Full Thickness Without Exposed Classification: Support Structures Wound Margin: Flat and Intact Exudate Large Amount: Exudate Type: Serosanguineous Exudate Color: red, brown Foul Odor After Cleansing: No Slough/Fibrino Yes Wound Bed Granulation Amount: Large (67-100%) Exposed Structure Granulation Quality: Pink Fascia Exposed: No Necrotic Amount: Small  (1-33%) Fat Layer (Subcutaneous Tissue) Exposed: No Gignac, Abhijot L. ([id number]) Necrotic Quality: Adherent Slough Tendon Exposed: No Muscle Exposed: No Joint Exposed: No Bone Exposed: No Periwound Skin Texture Texture Color No Abnormalities Noted: No No Abnormalities Noted: No Callus: No Atrophie Blanche: No Crepitus: No Cyanosis: No Excoriation: No Ecchymosis: Yes Induration: No Erythema: Yes Rash: No Erythema Location: Circumferential Scarring: No Hemosiderin Staining: No Mottled: No Moisture Pallor: No No Abnormalities Noted: No Rubor: No Dry / Scaly: No Maceration: Yes Temperature / Pain Temperature: No Abnormality Tenderness on Palpation: Yes Wound Preparation Ulcer Cleansing: Other: soap and water, Topical Anesthetic Applied: Other: lidocaine 4%, Treatment Notes Wound #1 (Right, Medial Malleolus) 1. Cleansed with: Clean wound with Normal Saline Cleanse wound with antibacterial soap and water 2. Anesthetic Topical Lidocaine 4% cream to wound bed prior to debridement 3. Peri-wound Care: Other peri-wound care (specify in notes) 4. Dressing Applied: Aquacel Ag 5. Secondary Dressing Applied ABD Pad Dry Gauze 7. Secured with Tape 4-Layer Compression System - Right Lower Extremity Notes xtrasorb, unna to anchor, TCA Electronic Signature(s) Signed: 07/22/2017 4:42:53 PM By: Alric Quan Duncanson, Dayshawn L. ([id number]) Entered By: Alric Quan on 07/21/2017 08:14:04 Klumpp, Xadrian L. ([id number]) -------------------------------------------------------------------------------- Vitals Details Patient Name: LIBERTY, STEAD L. Date of Service: 07/21/2017 8:00 AM Medical Record Number: [id number] Patient Account Number: [id number] Date of Birth/Sex: 1956-10-25 (61 y.o. Male) Treating RN: Carolyne Fiscal, Debi Primary Care Kenadie Royce: Esmond Camper Other Clinician: Referring Bosco Paparella: Mingo Amber, JEFFREY Treating Duane Earnshaw/Extender: Tito Dine in  Treatment: 3 Vital Signs Time Taken: 08:05 Temperature (F): 97.9 Height (in): 72 Pulse (bpm): 76 Weight (lbs): 245 Respiratory Rate (breaths/min): 18 Body Mass Index (BMI): 33.2 Blood Pressure (mmHg): 152/78 Reference Range: 80 - 120 mg / dl Electronic Signature(s) Signed: 07/22/2017 4:42:53 PM By: Alric Quan Entered By: Alric Quan on 07/21/2017 08:05:43

## 2017-07-24 NOTE — Progress Notes (Signed)
Emmick, Tatsuya L. ([id number]) Visit Report for 07/21/2017 HPI Details Patient Name: [name] [name], [name] [name]. Date of Service: 07/21/2017 8:00 AM Medical Record Number: [id number] Patient Account Number: [id number] Date of Birth/Sex: 16-Nov-1956 (61 y.o. Male) Treating RN: Carolyne Fiscal, Debi Primary Care Provider: Esmond Camper Other Clinician: Referring Provider: Mingo Amber, JEFFREY Treating Provider/Extender: Tito Dine in Treatment: 3 History of Present Illness HPI Description: 06/30/17 on evaluation today patient presents with a recurrent ulcer of the medial right ankle. He has had a region of atrophy and discoloration since he had vein strippin "years ago". Nonetheless she tells me at this point on evaluation today that this area reopened a 2 weeks ago and he initially contacted the [facility] wound center for an appointment but the appointment was so far out that he was unable to wait till that point. He therefore made an appointment here in [place] per the recommendation. He has been tolerating the wound from the standpoint of pain although he tells me it's not too painful. 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 He knows he should be using it however. Fortunately there's no evidence of infection in the wound appears to be doing well. 07/06/17; patient admitted to the clinic last week with a recurrent venous insufficiency ulcer on the medial right ankle. Fairly extensive open wound with surrounding erythematous discolored scan as well as small dilated veins inferiorly in the wound and medially in the foot. He describes tenderness actually lower down in the foot over these veins. His history goes back to vein stripping on the right leg 15 years ago. He was cared for in [place] when are sister clinic by Dr. Con Memos in 2016 at that time with a wound  in the same spot. After he left the clinic he went on to laser ablation by Dr[facility] I'll need to check these records. Per our intake nurse the wound looks better today. 07/14/17; patient complains of a lot of pain in the lower heel below the wound. There is discoloration here but certainly no different from last week. I did review the notes from Dr. Kellie Simmering he last saw him in November 2016 post successful laser ablation of the right greater saphenous vein. Was noted he had severe skin changes in that area even at that time 07/21/17; this patient continues to do really well although he is still having moderate drainage. Chronic venous insufficiency wound area with significant venous inflammation. He has had previous ablation by Dr. Kellie Simmering in 2016 of the right greater saphenous vein. Question is on the right medial ankle extending into the medial aspect of the calcaneus. We have been using silver alginate Electronic Signature(s) Signed: 07/22/2017 3:50:17 PM By: Linton Ham MD Entered By: Linton Ham on 07/21/2017 08:31:31 [name], [name] ([id number]) -------------------------------------------------------------------------------- Physical Exam Details Patient Name: Born, [name] Date of Service: 07/21/2017 8:00 AM Medical Record Number: [id number] Patient Account Number: [id number] Date of Birth/Sex: 05/25/1956 (60 y.o. Male) Treating RN: Carolyne Fiscal, Debi Primary Care Provider: Esmond Camper Other Clinician: Referring Provider: Mingo Amber, JEFFREY Treating Provider/Extender: Tito Dine in Treatment: 3 Constitutional Patient is hypertensive.. Pulse regular and within target range for patient.Marland Kitchen Respirations regular, non-labored and within target range.. Temperature is normal and within the target range for the patient.Marland Kitchen appears in no distress. Eyes Conjunctivae clear. No discharge. Cardiovascular Pedal pulses palpable  and strong bilaterally.. Edema  is well controlled. He has chronic venous inflammation in the medial aspect of the right ankle and heel. Gastrointestinal (GI) Abdomen is soft and non-distended without masses or tenderness. Bowel sounds active in all quadrants.. Lymphatic none palpable in the popliteal or inguinal area on the right. Integumentary (Hair, Skin) Skin and subcutaneous tissue without rashes, lesions, discoloration or ulcers. No evidence of fungal disease. Hair and skin color texture normal and healthy in appearance.Marland Kitchen Psychiatric No evidence of depression, anxiety, or agitation. Calm, cooperative, and communicative. Appropriate interactions and affect.. Notes Woman exam; the areas on the right medial ankle and heel now 2 separate areas. In general they look quite good. The larger superior wound is well granulated the inferior area has some depth small linear horizontal wound. The degree of inflammation and pain here is a lot better. There is no evidence of cellulitis Electronic Signature(s) Signed: 07/22/2017 3:50:17 PM By: Linton Ham MD Entered By: Linton Ham on 07/21/2017 08:40:22 [name] [name] ([id number]) -------------------------------------------------------------------------------- Physician Orders Details Patient Name: [name] [name], [name] Date of Service: 07/21/2017 8:00 AM Medical Record Number: [id number] Patient Account Number: [id number] Date of Birth/Sex: 01/04/1956 (61 y.o. Male) Treating RN: Carolyne Fiscal, Debi Primary Care Provider: Esmond Camper Other Clinician: Referring Provider: Mingo Amber, JEFFREY Treating Provider/Extender: Tito Dine in Treatment: 3 Verbal / Phone Orders: Yes Clinician: Pinkerton, Debi Read Back and Verified: Yes Diagnosis Coding Wound Cleansing Wound #1 Right,Medial Malleolus o Clean wound with Normal Saline. Anesthetic Wound #1 Right,Medial Malleolus o Topical Lidocaine 4% cream applied to wound bed prior to debridement Skin  Barriers/Peri-Wound Care Wound #1 Right,Medial Malleolus o Barrier cream o Moisturizing lotion o Triamcinolone Acetonide Ointment Primary Wound Dressing Wound #1 Right,Medial Malleolus o Aquacel Ag Secondary Dressing Wound #1 Right,Medial Malleolus o ABD pad o Dry Gauze o XtraSorb Dressing Change Frequency Wound #1 Right,Medial Malleolus o Change dressing every week Follow-up Appointments Wound #1 Right,Medial Malleolus o Return Appointment in 1 week. o Nurse Visit as needed Edema Control Wound #1 Right,Medial Malleolus Braid, Corrado L. ([id number]) o 4-Layer Compression System - Right Lower Extremity - unna to anchor Additional Orders / Instructions Wound #1 Right,Medial Malleolus o Increase protein intake. o Other: - Please add vitamin A, vitamin C and zinc supplements to your diet Electronic Signature(s) Signed: 07/22/2017 3:50:17 PM By: Linton Ham MD Signed: 07/22/2017 4:42:53 PM By: Alric Quan Entered By: Alric Quan on 07/21/2017 08:19:43 Langbehn, Saben L. ([id number]) -------------------------------------------------------------------------------- Problem List Details Patient Name: [name] [name], [name] Date of Service: 07/21/2017 8:00 AM Medical Record Number: [id number] Patient Account Number: [id number] Date of Birth/Sex: [birth year] (61 y.o. Male) Treating RN: Carolyne Fiscal, Debi Primary Care Provider: Esmond Camper Other Clinician: Referring Provider: Mingo Amber, JEFFREY Treating Provider/Extender: Tito Dine in Treatment: 3 Active Problems ICD-10 Encounter Code Description Active Date Diagnosis I87.2 Venous insufficiency (chronic) (peripheral) 06/30/2017 Yes L97.312 Non-pressure chronic ulcer of right ankle with fat layer 06/30/2017 Yes exposed [place] (primary) hypertension 06/30/2017 Yes Inactive Problems Resolved Problems Electronic Signature(s) Signed: 07/22/2017 3:50:17 PM By: Linton Ham MD Entered  By: Linton Ham on 07/21/2017 08:29:58 Sassano, Eward L. ([id number]) -------------------------------------------------------------------------------- Progress Note Details Patient Name: [name] [name], [name] Date of Service: 07/21/2017 8:00 AM Medical Record Number: [id number] Patient Account Number: [id number] Date of Birth/Sex: 1956-12-01 (61 y.o. Male) Treating RN: Carolyne Fiscal, Debi Primary Care Provider: Esmond Camper Other Clinician: Referring Provider: Mingo Amber, JEFFREY Treating Provider/Extender: Tito Dine in Treatment: 3 Subjective History of Present Illness (HPI) 06/30/17 on evaluation today patient presents  with a recurrent ulcer of the medial right ankle. He has had a region of atrophy and discoloration since he had vein strippin "years ago". Nonetheless she tells me at this point on evaluation today that this area reopened a 2 weeks ago and he initially contacted the [facility] wound center for an appointment but the appointment was so far out that he was unable to wait till that point. He therefore made an appointment here in [place] per the recommendation. He has been tolerating the wound from the standpoint of pain although he tells me it's not too painful. 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 He knows he should be using it however. Fortunately there's no evidence of infection in the wound appears to be doing well. 07/06/17; patient admitted to the clinic last week with a recurrent venous insufficiency ulcer on the medial right ankle. Fairly extensive open wound with surrounding erythematous discolored scan as well as small dilated veins inferiorly in the wound and medially in the foot. He describes tenderness actually lower down in the foot over these veins. His history goes back to vein stripping on the right leg 15 years  ago. He was cared for in [place] when are sister clinic by Dr. Con Memos in 2016 at that time with a wound in the same spot. After he left the clinic he went on to laser ablation by Dr. Kellie Simmering at [facility]. I'll need to check these records. Per our intake nurse the wound looks better today. 07/14/17; patient complains of a lot of pain in the lower heel below the wound. There is discoloration here but certainly no different from last week. I did review the notes from Dr. Kellie Simmering he last saw him in November 2016 post successful laser ablation of the right greater saphenous vein. Was noted he had severe skin changes in that area even at that time 07/21/17; this patient continues to do really well although he is still having moderate drainage. Chronic venous insufficiency wound area with significant venous inflammation. He has had previous ablation by Dr. Kellie Simmering in 2016 of the right greater saphenous vein. Question is on the right medial ankle extending into the medial aspect of the calcaneus. We have been using silver alginate Objective Constitutional Patient is hypertensive.. Pulse regular and within target range for patient.Marland Kitchen Respirations regular, non-labored Inscore, Chevon L. ([id number]) and within target range.. Temperature is normal and within the target range for the patient.Marland Kitchen appears in no distress. Vitals Time Taken: 8:05 AM, Height: 72 in, Weight: 245 lbs, BMI: 33.2, Temperature: 97.9 F, Pulse: 76 bpm, Respiratory Rate: 18 breaths/min, Blood Pressure: 152/78 mmHg. Eyes Conjunctivae clear. No discharge. Cardiovascular Pedal pulses palpable and strong bilaterally.. Edema is well controlled. He has chronic venous inflammation in the medial aspect of the right ankle and heel. Gastrointestinal (GI) Abdomen is soft and non-distended without masses or tenderness. Bowel sounds active in all quadrants.. Lymphatic none palpable in the popliteal or inguinal area on the  right. Psychiatric No evidence of depression, anxiety, or agitation. Calm, cooperative, and communicative. Appropriate interactions and affect.. General Notes: Woman exam; the areas on the right medial ankle and heel now 2 separate areas. In general they look quite good. The larger superior wound is well granulated the inferior area has some depth small linear horizontal wound. The degree of inflammation and pain here  is a lot better. There is no evidence of cellulitis Integumentary (Hair, Skin) Skin and subcutaneous tissue without rashes, lesions, discoloration or ulcers. No evidence of fungal disease. Hair and skin color texture normal and healthy in appearance.. Wound #1 status is Open. Original cause of wound was Gradually Appeared. The wound is located on the Right,Medial Malleolus. The wound measures 6.5cm length x 3cm width x 0.2cm depth; 15.315cm^2 area and 3.063cm^3 volume. There is no tunneling or undermining noted. There is a large amount of serosanguineous drainage noted. The wound margin is flat and intact. There is large (67-100%) pink granulation within the wound bed. There is a small (1-33%) amount of necrotic tissue within the wound bed including Adherent Slough. The periwound skin appearance exhibited: Maceration, Ecchymosis, Erythema. The periwound skin appearance did not exhibit: Callus, Crepitus, Excoriation, Induration, Rash, Scarring, Dry/Scaly, Atrophie Blanche, Cyanosis, Hemosiderin Staining, Mottled, Pallor, Rubor. The surrounding wound skin color is noted with erythema which is circumferential. Periwound temperature was noted as No Abnormality. The periwound has tenderness on palpation. Assessment [name] [name], [name] ([id number]) Active Problems ICD-10 I87.2 - Venous insufficiency (chronic) (peripheral) L97.312 - Non-pressure chronic ulcer of right ankle with fat layer exposed I10 - Essential (primary) hypertension Plan Wound Cleansing: Wound #1 Right,Medial  Malleolus: Clean wound with Normal Saline. Anesthetic: Wound #1 Right,Medial Malleolus: Topical Lidocaine 4% cream applied to wound bed prior to debridement Skin Barriers/Peri-Wound Care: Wound #1 Right,Medial Malleolus: Barrier cream Moisturizing lotion Triamcinolone Acetonide Ointment Primary Wound Dressing: Wound #1 Right,Medial Malleolus: Aquacel Ag Secondary Dressing: Wound #1 Right,Medial Malleolus: ABD pad Dry Gauze XtraSorb Dressing Change Frequency: Wound #1 Right,Medial Malleolus: Change dressing every week Follow-up Appointments: Wound #1 Right,Medial Malleolus: Return Appointment in 1 week. Nurse Visit as needed Edema Control: Wound #1 Right,Medial Malleolus: 4-Layer Compression System - Right Lower Extremity - unna to anchor Additional Orders / Instructions: Wound #1 Right,Medial Malleolus: Increase protein intake. Other: - Please add vitamin A, vitamin C and zinc supplements to your diet [name] [name], [name] ([id number]) #1 in general I am happy with how this is doing. #2 continued with silver alginate largely because of the drainage. #3 the more difficult wound may be the small horizontal wound on the medial right heel as it has some depth. Both wound areas however are improved versus last week. #4 I've asked the patient to make a follow-up with Dr. Kellie Simmering to see if anything else can be done with the greater saphenous vein in this area although he is already had an ablation. Electronic Signature(s) Signed: 07/22/2017 3:50:17 PM By: Linton Ham MD Entered By: Linton Ham on 07/21/2017 08:42:27 [name] [name], [name] ([id number]) -------------------------------------------------------------------------------- SuperBill Details Patient Name: AUDIEL, SCHEIBER L. Date of Service: 07/21/2017 Medical Record Number: [id number] Patient Account Number: [id number] Date of Birth/Sex: May 29, 1956 (61 y.o. Male) Treating RN: Carolyne Fiscal, Debi Primary Care Provider: Esmond Camper Other Clinician: Referring Provider: Mingo Amber, JEFFREY Treating Provider/Extender: Tito Dine in Treatment: 3 Diagnosis Coding ICD-10 Codes Code Description I87.2 Venous insufficiency (chronic) (peripheral) L97.312 Non-pressure chronic ulcer of right ankle with fat layer exposed [place] (primary) hypertension Facility Procedures CPT4: Description Modifier Quantity Code [id number] (Facility Use Only) [phone] - APPLY [place] RT 1 LEG Physician Procedures CPT4 Code Description: [id number] - WC PHYS LEVEL 3 - EST PT ICD-10 Description Diagnosis I87.2 Venous insufficiency (chronic) (peripheral) L97.312 Non-pressure chronic ulcer of right ankle with fat Modifier: layer expose Quantity: 1 d Electronic Signature(s) Signed: 07/22/2017 3:50:17 PM By: Linton Ham MD Signed: 07/22/2017 4:42:53  PM By: Alric Quan Entered By: Alric Quan on 07/21/2017 08:46:18

## 2017-07-28 ENCOUNTER — Encounter: Payer: 59 | Admitting: Physician Assistant

## 2017-07-28 DIAGNOSIS — L97312 Non-pressure chronic ulcer of right ankle with fat layer exposed: Secondary | ICD-10-CM | POA: Diagnosis not present

## 2017-07-28 DIAGNOSIS — I872 Venous insufficiency (chronic) (peripheral): Secondary | ICD-10-CM | POA: Diagnosis not present

## 2017-07-28 DIAGNOSIS — I10 Essential (primary) hypertension: Secondary | ICD-10-CM | POA: Diagnosis not present

## 2017-07-29 ENCOUNTER — Encounter: Payer: Self-pay | Admitting: *Deleted

## 2017-07-29 ENCOUNTER — Emergency Department (INDEPENDENT_AMBULATORY_CARE_PROVIDER_SITE_OTHER)
Admission: EM | Admit: 2017-07-29 | Discharge: 2017-07-29 | Disposition: A | Discharge status: Home / Self Care | Payer: 59 | Source: Home / Self Care | Attending: Family Medicine | Admitting: Family Medicine

## 2017-07-29 DIAGNOSIS — M7042 Prepatellar bursitis, left knee: Secondary | ICD-10-CM

## 2017-07-29 HISTORY — DX: Essential (primary) hypertension: I10

## 2017-07-29 MED ORDER — DOXYCYCLINE HYCLATE 100 MG PO CAPS: 100.0000 mg | ORAL_CAPSULE | Freq: Two times a day (BID) | ORAL | 0 refills | Status: DC

## 2017-07-29 MED FILL — DOXYCYCLINE HYCLATE 100 MG: 100 | 10 days supply | Qty: 20 | Fill #0

## 2017-07-29 NOTE — ED Triage Notes (Signed)
Patient c/o 2 days of left knee pain. He worked on flooring the day before. He is also nauseated and feels constipated. Last bowel movement 1 week ago. He is on Tramadol for a wound to his right knee.

## 2017-07-29 NOTE — Discharge Instructions (Signed)
Wear ace wrap to decrease swelling. May take Aleve, 2 tabs twice daily with food. For constipation, may take docusate sodium 100mg , 1 cap once or twice daily.

## 2017-07-29 NOTE — ED Provider Notes (Signed)
[name]    CSN: [id number] Arrival date & time: 07/29/17  1103     History   Chief Complaint Chief Complaint  Patient presents with  . Knee Pain    HPI [name] [name] is a 61 y.o. male.   Patient was kneeling for about 5-6 hours on his knees two days ago while installing laminate flooring.  He subsequently developed pain/swelling in his anterior left knee, worse today.  No fevers, chills, and sweats.  He reports nausea without vomiting. He reports that he is undergoing treatment of a chronic ulcer on his right medial ankle at [place].  He reports constipation as a result of taking tramadol, and believes that his nausea is secondary to his constipation.  No abdominal pain.   The history is provided by the patient.  Knee Pain  Location:  Knee Time since incident:  2 days Injury: no   Knee location:  L knee Pain details:    Quality:  Aching   Radiates to:  Does not radiate   Severity:  Moderate   Onset quality:  Gradual   Duration:  2 days   Timing:  Constant   Progression:  Worsening Chronicity:  New Foreign body present:  No foreign bodies Prior injury to area:  No Relieved by:  Nothing Worsened by:  Flexion Ineffective treatments:  Ice Associated symptoms: decreased ROM, stiffness and swelling   Associated symptoms: no fatigue, no fever, no itching, no muscle weakness and no numbness     Past Medical History:  Diagnosis Date  . GERD (gastroesophageal reflux disease)   . Hypertension   . Varicose veins     Patient Active Problem List   Diagnosis Date Noted  . Apraxia of eyelid opening 06/07/2017  . HTN (hypertension) 03/26/2016  . Insomnia 03/26/2016  . Varicose veins of right lower extremities with other complications 26/94/8546  . Varicose veins of leg with complications 27/02/5008  . Cough 05/24/2015  . Restless leg syndrome 03/12/2015  . Skin tag 03/12/2015  . Obesity (BMI 30-39.9) 11/01/2013  . HLD (hyperlipidemia)  03/13/2013  . GERD (gastroesophageal reflux disease) 03/13/2013    Past Surgical History:  Procedure Laterality Date  . ESOPHAGOGASTRIC FUNDOPLICATION  3818  . HERNIA REPAIR         Home Medications    Prior to Admission medications   Medication Sig Start Date End Date Taking? Authorizing Provider  hydrochlorothiazide (HYDRODIURIL) 25 MG tablet TAKE 1 TABLET BY MOUTH ONCE DAILY 11/06/16  Yes Alveda Reasons, MD  hydrOXYzine (ATARAX/VISTARIL) 25 MG tablet Take 1 pill as needed for situational anxiety 06/21/17  Yes Alveda Reasons, MD  pantoprazole (PROTONIX) 40 MG tablet Take 1 tablet (40 mg total) by mouth daily. 02/08/17  Yes Alveda Reasons, MD  traMADol (ULTRAM) 50 MG tablet Take by mouth every 6 (six) hours as needed.   Yes [provider]  doxycycline (VIBRAMYCIN) 100 MG capsule Take 1 capsule (100 mg total) by mouth 2 (two) times daily. Take with food. 07/29/17   Kandra Nicolas, MD  fish oil-omega-3 fatty acids 1000 MG capsule Take 2 g by mouth daily.      [provider]  white petrolatum ointment Apply topically as needed for dry skin. 11/20/12   Rosana Hoes, MD    Family History Family History  Problem Relation Age of Onset  . Diabetes Mother   . Hypertension Mother   . Heart disease Mother   . Varicose Veins Mother   .  Diabetes Father   . Hypertension Father   . Varicose Veins Father   . Heart attack Father   . Cancer Sister   . Diabetes Sister     Social History Social History  Substance Use Topics  . Smoking status: Never Smoker  . Smokeless tobacco: Never Used  . Alcohol use 0.6 oz/week    1 Cans of beer per week     Allergies   Patient has no known allergies.   Review of Systems Review of Systems  Constitutional: Negative for fatigue and fever.  Musculoskeletal: Positive for stiffness.  Skin: Negative for itching.  All other systems reviewed and are negative.    Physical Exam Triage Vital Signs ED Triage Vitals  Enc  Vitals Group     BP 07/29/17 1126 127/85     Pulse Rate 07/29/17 1126 (!) 104     Resp --      Temp 07/29/17 1126 98.8 F (37.1 C)     Temp Source 07/29/17 1126 Oral     SpO2 07/29/17 1126 97 %     Weight 07/29/17 1127 233 lb (105.7 kg)     Height --      Head Circumference --      Peak Flow --      Pain Score 07/29/17 1127 8     Pain Loc --      Pain Edu? --      Excl. in [place]? --    No data found.   Updated Vital Signs BP 127/85 (BP Location: Left Arm)   Pulse (!) 104   Temp 98.8 F (37.1 C) (Oral)   Wt 233 lb (105.7 kg)   SpO2 97%   BMI 31.60 kg/m   Visual Acuity Right Eye Distance:   Left Eye Distance:   Bilateral Distance:    Right Eye Near:   Left Eye Near:    Bilateral Near:     Physical Exam  Constitutional: He appears well-developed and well-nourished. No distress.  HENT:  Head: Normocephalic.  Eyes: Pupils are equal, round, and reactive to light.  Cardiovascular: Normal rate.   Pulmonary/Chest: Effort normal.  Musculoskeletal: He exhibits no edema.       Left knee: He exhibits decreased range of motion, swelling and erythema. He exhibits no effusion.       Legs: Inflamed pre-patellar bursa left anterior knee with erythema, mild warmth, and tenderness.  Not fluctuant.  Patient has discomfort with full flexion of the left knee.  Neurological: He is alert.  Skin: Skin is warm and dry.  Nursing note and vitals reviewed.      UC Treatments / Results  Labs (all labs ordered are listed, but only abnormal results are displayed) Labs Reviewed - No data to display  EKG  EKG Interpretation None       Radiology No results found.  Procedures Procedures (including critical care time)  Medications Ordered in UC Medications - No data to display   Initial Impression / Assessment and Plan / UC Course  I have reviewed the triage vital signs and the nursing notes.  Pertinent labs & imaging results that were available during my care of the patient  were reviewed by me and considered in my medical decision making (see chart for details).    Begin Doxycycline 100mg  BID. Ace wrap applied. Wear ace wrap to decrease swelling. May take Aleve, 2 tabs twice daily with food. For constipation, may take docusate sodium 100mg , 1 cap once or twice daily.  Followup with Dr. Aundria Mems or Dr. Lynne Leader ([facility]) if not improving about 4 to 5 days.    Final Clinical Impressions(s) / UC Diagnoses   Final diagnoses:  Prepatellar bursitis of left knee    New Prescriptions New Prescriptions   DOXYCYCLINE (VIBRAMYCIN) 100 MG CAPSULE    Take 1 capsule (100 mg total) by mouth 2 (two) times daily. Take with food.        Kandra Nicolas, MD 07/29/17 1200

## 2017-07-30 NOTE — Progress Notes (Signed)
Dysert, Carmelo L. ([id number]) Visit Report for 07/28/2017 Chief Complaint Document Details Patient Name: [name] [name], [name] [name]. Date of Service: 07/28/2017 8:45 AM Medical Record Number: [id number] Patient Account Number: [id number] Date of Birth/Sex: 1956/04/14 (61 y.o. Male) Treating RN: Carolyne Fiscal, Debi Primary Care Provider: Esmond Camper Other Clinician: Referring Provider: Esmond Camper Treating Provider/Extender: Melburn Hake, Alfredia Desanctis Weeks in Treatment: 4 Information Obtained from: Patient Chief Complaint Right Medial Ankle Ulcer Electronic Signature(s) Signed: 07/28/2017 6:07:57 PM By: Worthy Keeler PA-C Entered By: Worthy Keeler on 07/28/2017 09:19:29 Hepburn, Saifullah L. ([id number]) -------------------------------------------------------------------------------- Debridement Details Patient Name: [name] [name], [name] Date of Service: 07/28/2017 8:45 AM Medical Record Number: [id number] Patient Account Number: [id number] Date of Birth/Sex: 13-Feb-1956 (61 y.o. Male) Treating RN: Carolyne Fiscal, Debi Primary Care Provider: Esmond Camper Other Clinician: Referring Provider: Mingo Amber, JEFFREY Treating Provider/Extender: STONE III, Tajai Suder Weeks in Treatment: 4 Debridement Performed for Wound #1 Right,Medial Malleolus Assessment: Performed By: Physician STONE III, Tyaisha Cullom E., PA-C Debridement: Debridement Severity of Tissue Pre Fat layer exposed Debridement: Pre-procedure Verification/Time Out Yes - 09:24 Taken: Start Time: 09:25 Pain Control: Lidocaine 4% Topical Solution Level: Skin/Subcutaneous Tissue Total Area Debrided (L x 2.5 (cm) x 1.9 (cm) = 4.75 (cm) W): Tissue and other Viable, Non-Viable, Exudate, Fibrin/Slough, Subcutaneous material debrided: Instrument: Curette Bleeding: Minimum Hemostasis Achieved: Pressure End Time: 09:28 Procedural Pain: 0 Post Procedural Pain: 0 Response to Treatment: Procedure was tolerated well Post Debridement Measurements of Total  Wound Length: (cm) 6.5 Width: (cm) 1.9 Depth: (cm) 0.2 Volume: (cm) 1.94 Character of Wound/Ulcer Post Requires Further Debridement Debridement: Severity of Tissue Post Debridement: Fat layer exposed Post Procedure Diagnosis Same as Pre-procedure Electronic Signature(s) Signed: 07/28/2017 4:55:24 PM By: Alric Quan Signed: 07/28/2017 6:07:57 PM By: Worthy Keeler PA-C Longmire, Pankaj L. ([id number]) Entered By: Alric Quan on 07/28/2017 09:27:30 Boursiquot, Benoit L. ([id number]) -------------------------------------------------------------------------------- HPI Details Patient Name: [name] [name], [name] Date of Service: 07/28/2017 8:45 AM Medical Record Number: [id number] Patient Account Number: [id number] Date of Birth/Sex: [birth year] (61 y.o. Male) Treating RN: Carolyne Fiscal, Debi Primary Care Provider: Esmond Camper Other Clinician: Referring Provider: Mingo Amber, JEFFREY Treating Provider/Extender: Melburn Hake, Atzin Buchta Weeks in Treatment: 4 History of Present Illness HPI Description: 06/30/17 on evaluation today patient presents with a recurrent ulcer of the medial right ankle. He has had a region of atrophy and discoloration since he had vein strippin "years ago". Nonetheless she tells me at this point on evaluation today that this area reopened a 2 weeks ago and he initially contacted the [facility] wound center for an appointment but the appointment was so far out that he was unable to wait till that point. He therefore made an appointment here in [place] per the recommendation. He has been tolerating the wound from the standpoint of pain although he tells me it's not too painful. 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 He knows he should be using it however. Fortunately there's no evidence of infection in the wound appears to be doing  well. 07/06/17; patient admitted to the clinic last week with a recurrent venous insufficiency ulcer on the medial right ankle. Fairly extensive open wound with surrounding erythematous discolored scan as well as small dilated veins inferiorly in the wound and medially in the foot. He describes tenderness actually lower down in the foot over these veins. His history goes back to vein stripping on  the right leg 15 years ago. He was cared for in [place] when are sister clinic by Dr. Con Memos in 2016 at that time with a wound in the same spot. After he left the clinic he went on to laser [facility] I'll need to check these records. Per our intake nurse the wound looks better today. 07/14/17; patient complains of a lot of pain in the lower heel below the wound. There is discoloration here but certainly no different from last week. I did review the notes from Dr. Kellie Simmering he last saw him in November 2016 post successful laser ablation of the right greater saphenous vein. Was noted he had severe skin changes in that area even at that time 07/21/17; this patient continues to do really well although he is still having moderate drainage. Chronic venous insufficiency wound area with significant venous inflammation. He has had previous ablation by Dr. Kellie Simmering in 2016 of the right greater saphenous vein. Question is on the right medial ankle extending into the medial aspect of the calcaneus. We have been using silver alginate 07/28/17 on evaluation today patient tells me that he has been having some increased discomfort in the right lower extremity wound for the past several days although today it is actually better. He also tells me that he had some fevers although not measured as well as chills but again that is better today really it was only last night that he experienced those symptoms. Unfortunately she feels like there is a concern that he could have an infection in regard to  the wound although he is not really certain about this. He fortunately has no bowel or bladder dysfunction no unexpected nausea vomiting or diarrhea. His pain today is much better around the a 2 out of 10 at rest however with cleansing of the wound this gets more severe rated to be a 5-6 out of 10 Electronic Signature(s) Signed: 07/28/2017 6:07:57 PM By: Worthy Keeler PA-C Entered By: Worthy Keeler on 07/28/2017 09:32:59 Yorke, Zyheir L. ([id number]) Hackbart, Offie L. ([id number]) -------------------------------------------------------------------------------- Physical Exam Details Patient Name: [name] [name], [name] Date of Service: 07/28/2017 8:45 AM Medical Record Number: [id number] Patient Account Number: [id number] Date of Birth/Sex: 20-Nov-1956 (61 y.o. Male) Treating RN: Carolyne Fiscal, Debi Primary Care Provider: Esmond Camper Other Clinician: Referring Provider: Mingo Amber, JEFFREY Treating Provider/Extender: STONE III, Haili Donofrio Weeks in Treatment: 4 Constitutional Well-nourished and well-hydrated in no acute distress. Respiratory normal breathing without difficulty. Psychiatric this patient is able to make decisions and demonstrates good insight into disease process. Alert and Oriented x 3. pleasant and cooperative. Notes Patient's wound shows no evidence of erythema significantly surrounding in the periwound location. He did have slough covering the wound bed that required debridement today. During debridement he did have increased pain in the lower wound portions but fortunately the upper were nonpainful. We did renowned this post procedure to try to help with discomfort. Electronic Signature(s) Signed: 07/28/2017 6:07:57 PM By: Worthy Keeler PA-C Entered By: Worthy Keeler on 07/28/2017 09:33:47 [name] [name], [name] [name] L. ([id number]) -------------------------------------------------------------------------------- Physician Orders Details Patient Name: [name] [name], [name] Date of Service:  07/28/2017 8:45 AM Medical Record Number: [id number] Patient Account Number: [id number] Date of Birth/Sex: [birth year] (61 y.o. Male) Treating RN: Carolyne Fiscal, Debi Primary Care Provider: Esmond Camper Other Clinician: Referring Provider: Mingo Amber, JEFFREY Treating Provider/Extender: Melburn Hake, Damonie Furney Weeks in Treatment: 4 Verbal / Phone Orders: Yes ClinicianCarolyne Fiscal, Debi Read Back and Verified: Yes Diagnosis Coding Wound Cleansing Wound #1  Right,Medial Malleolus o Clean wound with Normal Saline. Anesthetic Wound #1 Right,Medial Malleolus o Topical Lidocaine 4% cream applied to wound bed prior to debridement Skin Barriers/Peri-Wound Care Wound #1 Right,Medial Malleolus o Barrier cream o Moisturizing lotion o Triamcinolone Acetonide Ointment Primary Wound Dressing Wound #1 Right,Medial Malleolus o Hydrafera Blue Secondary Dressing Wound #1 Right,Medial Malleolus o ABD pad o Conform/Kerlix Dressing Change Frequency Wound #1 Right,Medial Malleolus o Change dressing every other day. Follow-up Appointments Wound #1 Right,Medial Malleolus o Return Appointment in 1 week. o Nurse Visit as needed Edema Control Wound #1 Right,Medial Malleolus o Elevate legs to the level of the heart and pump ankles as often as possible Artola, Aragon L. ([id number]) o Other: - ace bandage Additional Orders / Instructions Wound #1 Right,Medial Malleolus o Increase protein intake. o Other: - Please add vitamin A, vitamin C and zinc supplements to your diet Electronic Signature(s) Signed: 07/28/2017 4:55:24 PM By: Alric Quan Signed: 07/28/2017 6:07:57 PM By: Worthy Keeler PA-C Entered By: Alric Quan on 07/28/2017 09:36:15 Hagemeister, Ronon L. ([id number]) -------------------------------------------------------------------------------- Problem List Details Patient Name: [name] [name], [name] Date of Service: 07/28/2017 8:45 AM Medical Record Number:  [id number] Patient Account Number: [id number] Date of Birth/Sex: 08/14/1956 (61 y.o. Male) Treating RN: Carolyne Fiscal, Debi Primary Care Provider: Esmond Camper Other Clinician: Referring Provider: Mingo Amber, JEFFREY Treating Provider/Extender: Melburn Hake, Kaleia Longhi Weeks in Treatment: 4 Active Problems ICD-10 Encounter Code Description Active Date Diagnosis I87.2 Venous insufficiency (chronic) (peripheral) 06/30/2017 Yes L97.312 Non-pressure chronic ulcer of right ankle with fat layer 06/30/2017 Yes exposed [place] (primary) hypertension 06/30/2017 Yes Inactive Problems Resolved Problems Electronic Signature(s) Signed: 07/28/2017 6:07:57 PM By: Worthy Keeler PA-C Entered By: Worthy Keeler on 07/28/2017 09:18:23 Dubach, Nathanael L. ([id number]) -------------------------------------------------------------------------------- Progress Note Details Patient Name: [name] [name], [name] Date of Service: 07/28/2017 8:45 AM Medical Record Number: [id number] Patient Account Number: [id number] Date of Birth/Sex: September 13, 1956 (61 y.o. Male) Treating RN: Carolyne Fiscal, Debi Primary Care Provider: Esmond Camper Other Clinician: Referring Provider: Esmond Camper Treating Provider/Extender: Melburn Hake, Darria Corvera Weeks in Treatment: 4 Subjective Chief Complaint Information obtained from Patient Right Medial Ankle Ulcer History of Present Illness (HPI) 06/30/17 on evaluation today patient presents with a recurrent ulcer of the medial right ankle. He has had a region of atrophy and discoloration since he had vein strippin "years ago". Nonetheless she tells me at this point on evaluation today that this area reopened a 2 weeks ago and he initially contacted the [facility] wound center for an appointment but the appointment was so far out that he was unable to wait till that point. He therefore made an appointment here in [place] per the recommendation. He has been tolerating the wound from the standpoint of pain  although he tells me it's not too painful. 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 He knows he should be using it however. Fortunately there's no evidence of infection in the wound appears to be doing well. 07/06/17; patient admitted to the clinic last week with a recurrent venous insufficiency ulcer on the medial right ankle. Fairly extensive open wound with surrounding erythematous discolored scan as well as small dilated veins inferiorly in the wound and medially in the foot. He describes tenderness actually lower down in the foot over these veins. His history goes back to vein stripping on the right leg 15 years ago. He  was cared for in [place] when are sister clinic by Dr. Con Memos in 2016 at that time with a wound in the same spot. After he left the clinic he went on to laser ablation [facility] I'll need to check these records. Per our intake nurse the wound looks better today. 07/14/17; patient complains of a lot of pain in the lower heel below the wound. There is discoloration here but certainly no different from last week. I did review the notes from Dr. Kellie Simmering he last saw him in November 2016 post successful laser ablation of the right greater saphenous vein. Was noted he had severe skin changes in that area even at that time 07/21/17; this patient continues to do really well although he is still having moderate drainage. Chronic venous insufficiency wound area with significant venous inflammation. He has had previous ablation by Dr. Kellie Simmering in 2016 of the right greater saphenous vein. Question is on the right medial ankle extending into the medial aspect of the calcaneus. We have been using silver alginate 07/28/17 on evaluation today patient tells me that he has been having some increased discomfort in the right lower extremity  wound for the past several days although today it is actually better. He also tells me that he had some fevers although not measured as well as chills but again that is better today really it was only last night that he experienced those symptoms. Unfortunately she feels like there is a concern that he could have an infection in regard to the wound although he is not really certain about this. He fortunately has no bowel or bladder dysfunction no unexpected nausea vomiting or diarrhea. His pain today is much better around the a 2 out of 10 at rest however with cleansing of the wound this gets more severe rated to be a 5-6 out of 10 [name] [name], [name] ([id number]) Objective Constitutional Well-nourished and well-hydrated in no acute distress. Vitals Time Taken: 9:01 AM, Height: 72 in, Weight: 245 lbs, BMI: 33.2, Temperature: 98.0 F, Pulse: 91 bpm, Respiratory Rate: 18 breaths/min, Blood Pressure: 152/85 mmHg. Respiratory normal breathing without difficulty. Psychiatric this patient is able to make decisions and demonstrates good insight into disease process. Alert and Oriented x 3. pleasant and cooperative. General Notes: Patient's wound shows no evidence of erythema significantly surrounding in the periwound location. He did have slough covering the wound bed that required debridement today. During debridement he did have increased pain in the lower wound portions but fortunately the upper were nonpainful. We did renowned this post procedure to try to help with discomfort. Integumentary (Hair, Skin) Wound #1 status is Open. Original cause of wound was Gradually Appeared. The wound is located on the Right,Medial Malleolus. The wound measures 6.5cm length x 1.9cm width x 0.2cm depth; 9.7cm^2 area and 1.94cm^3 volume. There is no tunneling or undermining noted. There is a large amount of serosanguineous drainage noted. The wound margin is flat and intact. There is medium (34-66%)  pink granulation within the wound bed. There is a medium (34-66%) amount of necrotic tissue within the wound bed including Adherent Slough. The periwound skin appearance exhibited: Maceration, Ecchymosis, Erythema. The periwound skin appearance did not exhibit: Callus, Crepitus, Excoriation, Induration, Rash, Scarring, Dry/Scaly, Atrophie Blanche, Cyanosis, Hemosiderin Staining, Mottled, Pallor, Rubor. The surrounding wound skin color is noted with erythema which is circumferential. Periwound temperature was noted as No Abnormality. The periwound has tenderness on palpation. Assessment Active Problems ICD-10 [name] [name], [name] [name] ([id number])  I87.2 - Venous insufficiency (chronic) (peripheral) L97.312 - Non-pressure chronic ulcer of right ankle with fat layer exposed I10 - Essential (primary) hypertension Procedures Wound #1 Pre-procedure diagnosis of Wound #1 is a Venous Leg Ulcer located on the Right,Medial Malleolus .Severity of Tissue Pre Debridement is: Fat layer exposed. There was a Skin/Subcutaneous Tissue Debridement ([id number]) debridement with total area of 4.75 sq cm performed by STONE III, Shammond Arave E., PA-C. with the following instrument(s): Curette to remove Viable and Non-Viable tissue/material including Exudate, Fibrin/Slough, and Subcutaneous after achieving pain control using Lidocaine 4% Topical Solution. A time out was conducted at 09:24, prior to the start of the procedure. A Minimum amount of bleeding was controlled with Pressure. The procedure was tolerated well with a pain level of 0 throughout and a pain level of 0 following the procedure. Post Debridement Measurements: 6.5cm length x 1.9cm width x 0.2cm depth; 1.94cm^3 volume. Character of Wound/Ulcer Post Debridement requires further debridement. Severity of Tissue Post Debridement is: Fat layer exposed. Post procedure Diagnosis Wound #1: Same as Pre-Procedure Plan Wound Cleansing: Wound #1 Right,Medial  Malleolus: Clean wound with Normal Saline. Anesthetic: Wound #1 Right,Medial Malleolus: Topical Lidocaine 4% cream applied to wound bed prior to debridement Skin Barriers/Peri-Wound Care: Wound #1 Right,Medial Malleolus: Barrier cream Moisturizing lotion Triamcinolone Acetonide Ointment Primary Wound Dressing: Wound #1 Right,Medial Malleolus: Hydrafera Blue Secondary Dressing: Wound #1 Right,Medial Malleolus: ABD pad Conform/Kerlix Plancarte, Cartez L. ([id number]) Dressing Change Frequency: Wound #1 Right,Medial Malleolus: Change dressing every other day. Follow-up Appointments: Wound #1 Right,Medial Malleolus: Return Appointment in 1 week. Nurse Visit as needed Edema Control: Wound #1 Right,Medial Malleolus: Elevate legs to the level of the heart and pump ankles as often as possible Other: - ace bandage Additional Orders / Instructions: Wound #1 Right,Medial Malleolus: Increase protein intake. Other: - Please add vitamin A, vitamin C and zinc supplements to your diet Electronic Signature(s) Signed: 07/28/2017 6:07:57 PM By: Worthy Keeler PA-C Entered By: Worthy Keeler on 07/28/2017 10:16:26 Streater, Torri L. ([id number]) -------------------------------------------------------------------------------- SuperBill Details Patient Name: [name] [name], [name] Date of Service: 07/28/2017 Medical Record Number: [id number] Patient Account Number: [id number] Date of Birth/Sex: 1956/10/21 (61 y.o. Male) Treating RN: Carolyne Fiscal, Debi Primary Care Provider: Esmond Camper Other Clinician: Referring Provider: Mingo Amber, JEFFREY Treating Provider/Extender: Melburn Hake, [place] Perra Weeks in Treatment: 4 Diagnosis Coding ICD-10 Codes Code Description I87.2 Venous insufficiency (chronic) (peripheral) L97.312 Non-pressure chronic ulcer of right ankle with fat layer exposed I10 Essential (primary) hypertension Facility Procedures CPT4 Code Description: 16384665 11042 - DEB SUBQ TISSUE 20 SQ CM/<  ICD-10 Description Diagnosis L97.312 Non-pressure chronic ulcer of right ankle with fat Modifier: layer expose Quantity: 1 d Physician Procedures CPT4 Code Description: [id number] 77939 - WC PHYS SUBQ TISS 20 SQ CM ICD-10 Description Diagnosis L97.312 Non-pressure chronic ulcer of right ankle with fat Modifier: layer expose Quantity: 1 d Electronic Signature(s) Signed: 07/28/2017 6:07:57 PM By: Worthy Keeler PA-C Entered By: Worthy Keeler on 07/28/2017 09:35:41

## 2017-08-02 DIAGNOSIS — L03116 Cellulitis of left lower limb: Secondary | ICD-10-CM | POA: Diagnosis not present

## 2017-08-02 DIAGNOSIS — M25562 Pain in left knee: Secondary | ICD-10-CM | POA: Diagnosis not present

## 2017-08-02 DIAGNOSIS — M7042 Prepatellar bursitis, left knee: Secondary | ICD-10-CM | POA: Diagnosis not present

## 2017-08-04 ENCOUNTER — Encounter: Payer: 59 | Admitting: Physician Assistant

## 2017-08-04 DIAGNOSIS — L97312 Non-pressure chronic ulcer of right ankle with fat layer exposed: Secondary | ICD-10-CM | POA: Diagnosis not present

## 2017-08-04 DIAGNOSIS — I872 Venous insufficiency (chronic) (peripheral): Secondary | ICD-10-CM | POA: Diagnosis not present

## 2017-08-04 DIAGNOSIS — I10 Essential (primary) hypertension: Secondary | ICD-10-CM | POA: Diagnosis not present

## 2017-08-06 DIAGNOSIS — L03116 Cellulitis of left lower limb: Secondary | ICD-10-CM | POA: Diagnosis not present

## 2017-08-06 DIAGNOSIS — M7042 Prepatellar bursitis, left knee: Secondary | ICD-10-CM | POA: Diagnosis not present

## 2017-08-06 MED FILL — DOXYCYCLINE MONO 100 MG TAB: 100 | 10 days supply | Qty: 20 | Fill #0

## 2017-08-06 NOTE — Progress Notes (Signed)
Hole, Sadarius L. ([id number]) Visit Report for 08/04/2017 Chief Complaint Document Details Patient Name: [name] [name]. Date of Service: 08/04/2017 8:00 AM Medical Record Number: [id number] Patient Account Number: [id number] Date of Birth/Sex: January 27, 1956 (61 y.o. Male) Treating RN: Carolyne Fiscal, Debi Primary Care Provider: Esmond Camper Other Clinician: Referring Provider: Esmond Camper Treating Provider/Extender: Melburn Hake, Yechezkel Fertig Weeks in Treatment: 5 Information Obtained from: Patient Chief Complaint Right Medial Ankle Ulcer Electronic Signature(s) Signed: 08/05/2017 12:37:32 AM By: Worthy Keeler PA-C Entered By: Worthy Keeler on 08/04/2017 08:16:31 [name] [name] [name] L. ([id number]) -------------------------------------------------------------------------------- HPI Details Patient Name: [name], [name] Date of Service: 08/04/2017 8:00 AM Medical Record Number: [id number] Patient Account Number: [id number] Date of Birth/Sex: [birth year] (61 y.o. Male) Treating RN: Carolyne Fiscal, Debi Primary Care Provider: Esmond Camper Other Clinician: Referring Provider: Mingo Amber, JEFFREY Treating Provider/Extender: Melburn Hake, Kaliegh Willadsen Weeks in Treatment: 5 History of Present Illness HPI Description: 06/30/17 on evaluation today patient presents with a recurrent ulcer of the medial right ankle. He has had a region of atrophy and discoloration since he had vein strippin "years ago". Nonetheless she tells me at this point on evaluation today that this area reopened a 2 weeks ago and he initially contacted the [facility] wound center for an appointment but the appointment was so far out that he was unable to wait till that point. He therefore made an appointment here in [place] per the recommendation. He has been tolerating the wound from the standpoint of pain although he tells me it's not too painful. 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 He knows he should be using it however. Fortunately there's no evidence of infection in the wound appears to be doing well. 07/06/17; patient admitted to the clinic last week with a recurrent venous insufficiency ulcer on the medial right ankle. Fairly extensive open wound with surrounding erythematous discolored scan as well as small dilated veins inferiorly in the wound and medially in the foot. He describes tenderness actually lower down in the foot over these veins. His history goes back to vein stripping on the right leg 15 years ago. He was cared for in [place] when are sister clinic by Dr. Con Memos in 2016 at that time with a wound in the same spot. After he left the clinic he went on to laser ablation by [facility] I'll need to check these records. Per our intake nurse the wound looks better today. 07/14/17; patient complains of a lot of pain in the lower heel below the wound. There is discoloration here but certainly no different from last week. I did review the notes from Dr. Kellie Simmering he last saw him in November 2016 post successful laser ablation of the right greater saphenous vein. Was noted he had severe skin changes in that area even at that time 07/21/17; this patient continues to do really well although he is still having moderate drainage. Chronic venous insufficiency wound area with significant venous inflammation. He has had previous ablation by Dr. Kellie Simmering in 2016 of the right greater saphenous vein. Question is on the right medial ankle extending into the medial aspect of the calcaneus. We have been using silver alginate 07/28/17 on evaluation today patient tells me that he has been having some increased discomfort in the right lower extremity wound for the past several days although today it is  actually better. He also tells me that he had some fevers although not measured as  well as chills but again that is better today really it was only last night that he experienced those symptoms. Unfortunately she feels like there is a concern that he could have an infection in regard to the wound although he is not really certain about this. He fortunately has no bowel or bladder dysfunction no unexpected nausea vomiting or diarrhea. His pain today is much better around the a 2 out of 10 at rest however with cleansing of the wound this gets more severe rated to be a 5-6 out of 10 08/04/17 on evaluation today patient's right medial ankle ulcer appears to be doing much better. He has noted a dramatic improvement in the overall discomfort which I think is an excellent step and leap from where he was last week with 5-6 out of 10 pain. Fortunately there appears to be no evidence of infection. No fevers, chills, nausea, or vomiting noted at this time. Siravo, Jacquel L. ([id number]) Electronic Signature(s) Signed: 08/05/2017 12:37:32 AM By: Worthy Keeler PA-C Entered By: Worthy Keeler on 08/04/2017 08:17:23 [name] [name] [name] L. ([id number]) -------------------------------------------------------------------------------- Physical Exam Details Patient Name: [name] [name] [name] L. Date of Service: 08/04/2017 8:00 AM Medical Record Number: [id number] Patient Account Number: [id number] Date of Birth/Sex: [birth year] (61 y.o. Male) Treating RN: Carolyne Fiscal, Debi Primary Care Provider: Esmond Camper Other Clinician: Referring Provider: Mingo Amber, JEFFREY Treating Provider/Extender: STONE III, Smt Lokey Weeks in Treatment: 5 Constitutional Well-nourished and well-hydrated in no acute distress. Respiratory normal breathing without difficulty. clear to auscultation bilaterally. Cardiovascular regular rate and rhythm with normal S1, S2. Psychiatric this patient is able to make decisions and demonstrates good insight into disease process. Alert and Oriented x 3. pleasant and  cooperative. Notes Patient's wound shows no evidence of erythema on evaluation today. He is an excellent wound bed and no sharp debridement was required at this point. Electronic Signature(s) Signed: 08/05/2017 12:37:32 AM By: Worthy Keeler PA-C Entered By: Worthy Keeler on 08/04/2017 08:18:12 [name] [name] [name] L. ([id number]) -------------------------------------------------------------------------------- Physician Orders Details Patient Name: [name] [name] [name] L. Date of Service: 08/04/2017 8:00 AM Medical Record Number: [id number] Patient Account Number: [id number] Date of Birth/Sex: 09/26/1956 (61 y.o. Male) Treating RN: Carolyne Fiscal, Debi Primary Care Provider: Esmond Camper Other Clinician: Referring Provider: Mingo Amber, JEFFREY Treating Provider/Extender: Melburn Hake, Gwenith Tschida Weeks in Treatment: 5 Verbal / Phone Orders: Yes ClinicianCarolyne Fiscal, Debi Read Back and Verified: Yes Diagnosis Coding ICD-10 Coding Code Description I87.2 Venous insufficiency (chronic) (peripheral) L97.312 Non-pressure chronic ulcer of right ankle with fat layer exposed I10 Essential (primary) hypertension Wound Cleansing Wound #1 Right,Medial Malleolus o Clean wound with Normal Saline. Anesthetic Wound #1 Right,Medial Malleolus o Topical Lidocaine 4% cream applied to wound bed prior to debridement Skin Barriers/Peri-Wound Care Wound #1 Right,Medial Malleolus o Triamcinolone Acetonide Ointment Primary Wound Dressing Wound #1 Right,Medial Malleolus o Hydrafera Blue Secondary Dressing Wound #1 Right,Medial Malleolus o ABD pad o Conform/Kerlix Dressing Change Frequency Wound #1 Right,Medial Malleolus o Change dressing every other day. Follow-up Appointments Wound #1 Right,Medial Malleolus o Return Appointment in 1 week. [name] [name] [name] L. ([id number]) o Nurse Visit as needed Edema Control Wound #1 Right,Medial Malleolus o Elevate legs to the level of the heart and pump ankles as  often as possible o Other: - ace bandage Additional Orders / Instructions Wound #1 Right,Medial Malleolus o Increase protein intake. o Other: - Please add vitamin A, vitamin C and zinc supplements to your diet Electronic Signature(s)  Signed: 08/04/2017 4:53:15 PM By: Alric Quan Signed: 08/05/2017 12:37:32 AM By: Worthy Keeler PA-C Entered By: Alric Quan on 08/04/2017 08:15:52 [name] [name] [name] L. ([id number]) -------------------------------------------------------------------------------- Problem List Details Patient Name: RONDEL, EPISCOPO L. Date of Service: 08/04/2017 8:00 AM Medical Record Number: [id number] Patient Account Number: [id number] Date of Birth/Sex: 07-May-1956 (61 y.o. Male) Treating RN: Carolyne Fiscal, Debi Primary Care Provider: Esmond Camper Other Clinician: Referring Provider: Mingo Amber, JEFFREY Treating Provider/Extender: Melburn Hake, Rivkah Wolz Weeks in Treatment: 5 Active Problems ICD-10 Encounter Code Description Active Date Diagnosis I87.2 Venous insufficiency (chronic) (peripheral) 06/30/2017 Yes L97.312 Non-pressure chronic ulcer of right ankle with fat layer 06/30/2017 Yes exposed [place] (primary) hypertension 06/30/2017 Yes Inactive Problems Resolved Problems Electronic Signature(s) Signed: 08/05/2017 12:37:32 AM By: Worthy Keeler PA-C Entered By: Worthy Keeler on 08/04/2017 08:12:31 [name] [name] [name] L. ([id number]) -------------------------------------------------------------------------------- Progress Note Details Patient Name: [name] [name] [name] L. Date of Service: 08/04/2017 8:00 AM Medical Record Number: [id number] Patient Account Number: [id number] Date of Birth/Sex: 02-Jan-1956 (61 y.o. Male) Treating RN: Carolyne Fiscal, Debi Primary Care Provider: Esmond Camper Other Clinician: Referring Provider: Mingo Amber, JEFFREY Treating Provider/Extender: Melburn Hake, Lorelle Macaluso Weeks in Treatment: 5 Subjective Chief Complaint Information obtained from  Patient Right Medial Ankle Ulcer History of Present Illness (HPI) 06/30/17 on evaluation today patient presents with a recurrent ulcer of the medial right ankle. He has had a region of atrophy and discoloration since he had vein strippin "years ago". Nonetheless she tells me at this point on evaluation today that this area reopened a 2 weeks ago and he initially contacted the [facility] wound center for an appointment but the appointment was so far out that he was unable to wait till that point. He therefore made an appointment here in [place] per the recommendation. He has been tolerating the wound from the standpoint of pain although he tells me it's not too painful. 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 He knows he should be using it however. Fortunately there's no evidence of infection in the wound appears to be doing well. 07/06/17; patient admitted to the clinic last week with a recurrent venous insufficiency ulcer on the medial right ankle. Fairly extensive open wound with surrounding erythematous discolored scan as well as small dilated veins inferiorly in the wound and medially in the foot. He describes tenderness actually lower down in the foot over these veins. His history goes back to vein stripping on the right leg 15 years ago. He was cared for in [place] when are sister clinic by Dr. Con Memos in 2016 at that time with a wound in the same spot. After he left the clinic he went on to laser ablation by Dr[facility]. I'll need to check these records. Per our intake nurse the wound looks better today. 07/14/17; patient complains of a lot of pain in the lower heel below the wound. There is discoloration here but certainly no different from last week. I did review the notes from Dr. Kellie Simmering he last saw him in November 2016 post  successful laser ablation of the right greater saphenous vein. Was noted he had severe skin changes in that area even at that time 07/21/17; this patient continues to do really well although he is still having moderate drainage. Chronic venous insufficiency wound area with significant venous inflammation. He has had previous ablation by Dr.  Kellie Simmering in 2016 of the right greater saphenous vein. Question is on the right medial ankle extending into the medial aspect of the calcaneus. We have been using silver alginate 07/28/17 on evaluation today patient tells me that he has been having some increased discomfort in the right lower extremity wound for the past several days although today it is actually better. He also tells me that he had some fevers although not measured as well as chills but again that is better today really it was only last night that he experienced those symptoms. Unfortunately she feels like there is a concern that he could have an infection in regard to the wound although he is not really certain about this. He fortunately has no bowel or bladder dysfunction no unexpected nausea vomiting or diarrhea. His pain today is much better around the a 2 out of 10 at rest however with cleansing of the wound this gets more severe rated to be a 5-6 out of 10 [name] [name] [name] L. ([id number]) 08/04/17 on evaluation today patient's right medial ankle ulcer appears to be doing much better. He has noted a dramatic improvement in the overall discomfort which I think is an excellent step and leap from where he was last week with 5-6 out of 10 pain. Fortunately there appears to be no evidence of infection. No fevers, chills, nausea, or vomiting noted at this time. Objective Constitutional Well-nourished and well-hydrated in no acute distress. Vitals Time Taken: 8:04 AM, Height: 72 in, Weight: 245 lbs, BMI: 33.2, Temperature: 97.6 F, Pulse: 78 bpm, Respiratory Rate: 18 breaths/min, Blood Pressure:  135/80 mmHg. Respiratory normal breathing without difficulty. clear to auscultation bilaterally. Cardiovascular regular rate and rhythm with normal S1, S2. Psychiatric this patient is able to make decisions and demonstrates good insight into disease process. Alert and Oriented x 3. pleasant and cooperative. General Notes: Patient's wound shows no evidence of erythema on evaluation today. He is an excellent wound bed and no sharp debridement was required at this point. Integumentary (Hair, Skin) Wound #1 status is Open. Original cause of wound was Gradually Appeared. The wound is located on the Right,Medial Malleolus. The wound measures 6cm length x 1.2cm width x 0.2cm depth; 5.655cm^2 area and 1.131cm^3 volume. There is no tunneling or undermining noted. There is a large amount of serosanguineous drainage noted. The wound margin is flat and intact. There is medium (34-66%) pink granulation within the wound bed. There is a medium (34-66%) amount of necrotic tissue within the wound bed including Adherent Slough. The periwound skin appearance exhibited: Maceration, Ecchymosis, Erythema. The periwound skin appearance did not exhibit: Callus, Crepitus, Excoriation, Induration, Rash, Scarring, Dry/Scaly, Atrophie Blanche, Cyanosis, Hemosiderin Staining, Mottled, Pallor, Rubor. The surrounding wound skin color is noted with erythema which is circumferential. Periwound temperature was noted as No Abnormality. The periwound has tenderness on palpation. Buzzell, Ladanian L. ([id number]) Assessment Active Problems ICD-10 I87.2 - Venous insufficiency (chronic) (peripheral) L97.312 - Non-pressure chronic ulcer of right ankle with fat layer exposed I10 - Essential (primary) hypertension Plan Wound Cleansing: Wound #1 Right,Medial Malleolus: Clean wound with Normal Saline. Anesthetic: Wound #1 Right,Medial Malleolus: Topical Lidocaine 4% cream applied to wound bed prior to debridement Skin  Barriers/Peri-Wound Care: Wound #1 Right,Medial Malleolus: Triamcinolone Acetonide Ointment Primary Wound Dressing: Wound #1 Right,Medial Malleolus: Hydrafera Blue Secondary Dressing: Wound #1 Right,Medial Malleolus: ABD pad Conform/Kerlix Dressing Change Frequency: Wound #1 Right,Medial Malleolus: Change dressing every other day. Follow-up Appointments: Wound #1 Right,Medial Malleolus: Return Appointment in 1 week. Nurse Visit as  needed Edema Control: Wound #1 Right,Medial Malleolus: Elevate legs to the level of the heart and pump ankles as often as possible Other: - ace bandage Additional Orders / Instructions: Wound #1 Right,Medial Malleolus: Increase protein intake. Other: - Please add vitamin A, vitamin C and zinc supplements to your diet Romano, Tayvon L. ([id number]) I'm gonna recommend that we continue with the current wound care measures for the next week. We will see were things stand at that point. If anything worsens in the interim patient will contact the office for additional recommendations. Otherwise hopefully he will continue to show signs of improvement. Electronic Signature(s) Signed: 08/05/2017 12:37:32 AM By: Worthy Keeler PA-C Entered By: Worthy Keeler on 08/04/2017 08:18:51 Dooner, Lela L. ([id number]) -------------------------------------------------------------------------------- SuperBill Details Patient Name: JAWARA, LATORRE L. Date of Service: 08/04/2017 Medical Record Number: [id number] Patient Account Number: [id number] Date of Birth/Sex: 01-01-1956 (61 y.o. Male) Treating RN: Carolyne Fiscal, Debi Primary Care Provider: Esmond Camper Other Clinician: Referring Provider: Mingo Amber, JEFFREY Treating Provider/Extender: Melburn Hake, Hellen Shanley Weeks in Treatment: 5 Diagnosis Coding ICD-10 Codes Code Description I87.2 Venous insufficiency (chronic) (peripheral) L97.312 Non-pressure chronic ulcer of right ankle with fat layer exposed [place]  (primary) hypertension Facility Procedures CPT4 Code: [id number] Description: 99213 - WOUND CARE VISIT-LEV 3 EST PT Modifier: Quantity: 1 Physician Procedures CPT4 Code Description: [id number] - WC PHYS LEVEL 3 - EST PT ICD-10 Description Diagnosis I87.2 Venous insufficiency (chronic) (peripheral) L97.312 Non-pressure chronic ulcer of right ankle with fat I10 Essential (primary) hypertension Modifier: layer expose Quantity: 1 d Electronic Signature(s) Signed: 08/04/2017 4:53:15 PM By: Alric Quan Signed: 08/05/2017 12:37:32 AM By: Worthy Keeler PA-C Entered By: Alric Quan on 08/04/2017 08:31:45

## 2017-08-09 NOTE — Progress Notes (Signed)
Dorton, Kacin L. ([id number]) Visit Report for 08/04/2017 Arrival Information Details Patient Name: [name] [name], [name] [name]. Date of Service: 08/04/2017 8:00 AM Medical Record Number: [id number] Patient Account Number: [id number] Date of Birth/Sex: 1956/08/03 (61 y.o. Male) Treating RN: Carolyne Fiscal, Debi Primary Care Hershy Flenner: Mingo Amber, JEFFREY Other Clinician: Referring Jermar Colter: Mingo Amber, JEFFREY Treating Giovanni Bath/Extender: Melburn Hake, HOYT Weeks in Treatment: 5 Visit Information History Since Last Visit All ordered tests and consults were completed: No Patient Arrived: Ambulatory Added or deleted any medications: No Arrival Time: 08:03 Any new allergies or adverse reactions: No Accompanied By: self Had a fall or experienced change in No Transfer Assistance: None activities of daily living that may affect Patient Identification Verified: Yes risk of falls: Secondary Verification Process Yes Signs or symptoms of abuse/neglect since last No Completed: visito Patient Requires Transmission-Based No Hospitalized since last visit: No Precautions: Has Dressing in Place as Prescribed: Yes Patient Has Alerts: No Pain Present Now: No Electronic Signature(s) Signed: 08/04/2017 4:53:15 PM By: Alric Quan Entered By: Alric Quan on 08/04/2017 08:03:27 Galvis, Tarius L. ([id number]) -------------------------------------------------------------------------------- Clinic Level of Care Assessment Details Patient Name: [name], [name]. Date of Service: 08/04/2017 8:00 AM Medical Record Number: [id number] Patient Account Number: [id number] Date of Birth/Sex: 06-Sep-1956 (61 y.o. Male) Treating RN: Carolyne Fiscal, Debi Primary Care Remus Hagedorn: Mingo Amber, JEFFREY Other Clinician: Referring Donja Tipping: Mingo Amber, JEFFREY Treating Maree Ainley/Extender: Melburn Hake, HOYT Weeks in Treatment: 5 Clinic Level of Care Assessment Items TOOL 4 Quantity Score X - Use when only an EandM is performed on FOLLOW-UP visit 1  0 ASSESSMENTS - Nursing Assessment / Reassessment X - Reassessment of Co-morbidities (includes updates in patient status) 1 10 X - Reassessment of Adherence to Treatment Plan 1 5 ASSESSMENTS - Wound and Skin Assessment / Reassessment X - Simple Wound Assessment / Reassessment - one wound 1 5 []  - Complex Wound Assessment / Reassessment - multiple wounds 0 []  - Dermatologic / Skin Assessment (not related to wound area) 0 ASSESSMENTS - Focused Assessment []  - Circumferential Edema Measurements - multi extremities 0 []  - Nutritional Assessment / Counseling / Intervention 0 []  - Lower Extremity Assessment (monofilament, tuning fork, pulses) 0 []  - Peripheral Arterial Disease Assessment (using hand held doppler) 0 ASSESSMENTS - Ostomy and/or Continence Assessment and Care []  - Incontinence Assessment and Management 0 []  - Ostomy Care Assessment and Management (repouching, etc.) 0 PROCESS - Coordination of Care X - Simple Patient / Family Education for ongoing care 1 15 []  - Complex (extensive) Patient / Family Education for ongoing care 0 []  - Staff obtains Programmer, systems, Records, Test Results / Process Orders 0 []  - Staff telephones HHA, Nursing Homes / Clarify orders / etc 0 []  - Routine Transfer to another Facility (non-emergent condition) 0 Swaminathan, Kendale L. ([id number]) []  - Routine Hospital Admission (non-emergent condition) 0 []  - New Admissions / Biomedical engineer / Ordering NPWT, Apligraf, etc. 0 []  - Emergency Hospital Admission (emergent condition) 0 X - Simple Discharge Coordination 1 10 []  - Complex (extensive) Discharge Coordination 0 PROCESS - Special Needs []  - Pediatric / Minor Patient Management 0 []  - Isolation Patient Management 0 []  - Hearing / Language / Visual special needs 0 []  - Assessment of Community assistance (transportation, D/C planning, etc.) 0 []  - Additional assistance / Altered mentation 0 []  - Support Surface(s) Assessment (bed, cushion, seat, etc.)  0 INTERVENTIONS - Wound Cleansing / Measurement X - Simple Wound Cleansing - one wound 1 5 []  - Complex Wound Cleansing - multiple wounds 0 X - Wound Imaging (  photographs - any number of wounds) 1 5 []  - Wound Tracing (instead of photographs) 0 X - Simple Wound Measurement - one wound 1 5 []  - Complex Wound Measurement - multiple wounds 0 INTERVENTIONS - Wound Dressings []  - Small Wound Dressing one or multiple wounds 0 X - Medium Wound Dressing one or multiple wounds 1 15 []  - Large Wound Dressing one or multiple wounds 0 X - Application of Medications - topical 1 5 []  - Application of Medications - injection 0 INTERVENTIONS - Miscellaneous []  - External ear exam 0 Kamaka, Jahseh L. ([id number]) []  - Specimen Collection (cultures, biopsies, blood, body fluids, etc.) 0 []  - Specimen(s) / Culture(s) sent or taken to Lab for analysis 0 []  - Patient Transfer (multiple staff / Harrel Lemon Lift / Similar devices) 0 []  - Simple Staple / Suture removal (25 or less) 0 []  - Complex Staple / Suture removal (26 or more) 0 []  - Hypo / Hyperglycemic Management (close monitor of Blood Glucose) 0 []  - Ankle / Brachial Index (ABI) - do not check if billed separately 0 X - Vital Signs 1 5 Has the patient been seen at the hospital within the last three years: Yes Total Score: 85 Level Of Care: New/Established - Level 3 Electronic Signature(s) Signed: 08/04/2017 4:53:15 PM By: Alric Quan Entered By: Alric Quan on 08/04/2017 08:31:34 Schoffstall, Reg L. ([id number]) -------------------------------------------------------------------------------- Encounter Discharge Information Details Patient Name: [name], [name]. Date of Service: 08/04/2017 8:00 AM Medical Record Number: [id number] Patient Account Number: [id number] Date of Birth/Sex: 1956-01-14 (61 y.o. Male) Treating RN: Carolyne Fiscal, Debi Primary Care Leonte Horrigan: Esmond Camper Other Clinician: Referring Hester Joslin: Esmond Camper Treating  Jashawn Floyd/Extender: Melburn Hake, HOYT Weeks in Treatment: 5 Encounter Discharge Information Items Discharge Pain Level: 0 Discharge Condition: Stable Ambulatory Status: Ambulatory Discharge Destination: Home Transportation: Private Auto Accompanied By: self Schedule Follow-up Appointment: Yes Medication Reconciliation completed and provided to Patient/Care No Antjuan Rothe: Provided on Clinical Summary of Care: 08/04/2017 Form Type Recipient Paper Patient RM Electronic Signature(s) Signed: 08/05/2017 10:14:57 AM By: Ruthine Dose Entered By: Ruthine Dose on 08/04/2017 08:22:23 Gavin, Logyn L. ([id number]) -------------------------------------------------------------------------------- Lower Extremity Assessment Details Patient Name: MELBOURNE, JAKUBIAK. Date of Service: 08/04/2017 8:00 AM Medical Record Number: [id number] Patient Account Number: [id number] Date of Birth/Sex: [birth year] (61 y.o. Male) Treating RN: Carolyne Fiscal, Debi Primary Care Dason Mosley: Esmond Camper Other Clinician: Referring Angelita Harnack: Mingo Amber, JEFFREY Treating Prentiss Hammett/Extender: STONE III, HOYT Weeks in Treatment: 5 Vascular Assessment Pulses: Dorsalis Pedis Palpable: [Right:Yes] Posterior Tibial Extremity colors, hair growth, and conditions: Extremity Color: [Right:Normal] Temperature of Extremity: [Right:Warm] Capillary Refill: [Right:< 3 seconds] Electronic Signature(s) Signed: 08/04/2017 4:53:15 PM By: Alric Quan Entered By: Alric Quan on 08/04/2017 08:11:25 Delaguila, Treyshaun L. ([id number]) -------------------------------------------------------------------------------- Multi Wound Chart Details Patient Name: HOYT, LEANOS L. Date of Service: 08/04/2017 8:00 AM Medical Record Number: [id number] Patient Account Number: [id number] Date of Birth/Sex: Feb 25, 1956 (61 y.o. Male) Treating RN: Carolyne Fiscal, Debi Primary Care Cindy Fullman: Esmond Camper Other Clinician: Referring Katonya Blecher: Mingo Amber,  JEFFREY Treating Leasa Kincannon/Extender: STONE III, HOYT Weeks in Treatment: 5 Vital Signs Height(in): 72 Pulse(bpm): 78 Weight(lbs): 245 Blood Pressure 135/80 (mmHg): Body Mass Index(BMI): 33 Temperature(F): 97.6 Respiratory Rate 18 (breaths/min): Photos: [1:No Photos] [N/A:N/A] Wound Location: [1:Right Malleolus - Medial] [N/A:N/A] Wounding Event: [1:Gradually Appeared] [N/A:N/A] Primary Etiology: [1:Venous Leg Ulcer] [N/A:N/A] Comorbid History: [1:Hypertension] [N/A:N/A] Date Acquired: [1:06/15/2017] [N/A:N/A] Weeks of Treatment: [1:5] [N/A:N/A] Wound Status: [1:Open] [N/A:N/A] Measurements L x W x D 6x1.2x0.2 [N/A:N/A] (cm) Area (cm) : [1:5.655] [N/A:N/A] Volume (cm) : [1:1.131] [N/A:N/A] %  Reduction in Area: [1:47.70%] [N/A:N/A] % Reduction in Volume: -4.60% [N/A:N/A] Classification: [1:Full Thickness Without Exposed Support Structures] [N/A:N/A] Exudate Amount: [1:Large] [N/A:N/A] Exudate Type: [1:Serosanguineous] [N/A:N/A] Exudate Color: [1:red, brown] [N/A:N/A] Wound Margin: [1:Flat and Intact] [N/A:N/A] Granulation Amount: [1:Medium (34-66%)] [N/A:N/A] Granulation Quality: [1:Pink] [N/A:N/A] Necrotic Amount: [1:Medium (34-66%)] [N/A:N/A] Exposed Structures: [1:Fascia: No Fat Layer (Subcutaneous Tissue) Exposed: No Tendon: No Muscle: No] [N/A:N/A] Joint: No Bone: No Epithelialization: Medium (34-66%) N/A N/A Periwound Skin Texture: Excoriation: No N/A N/A Induration: No Callus: No Crepitus: No Rash: No Scarring: No Periwound Skin Maceration: Yes N/A N/A Moisture: Dry/Scaly: No Periwound Skin Color: Ecchymosis: Yes N/A N/A Erythema: Yes Atrophie Blanche: No Cyanosis: No Hemosiderin Staining: No Mottled: No Pallor: No Rubor: No Erythema Location: Circumferential N/A N/A Temperature: No Abnormality N/A N/A Tenderness on Yes N/A N/A Palpation: Wound Preparation: Ulcer Cleansing: Other: N/A N/A soap and water Topical Anesthetic Applied: Other:  lidocaine 4% Treatment Notes Electronic Signature(s) Signed: 08/04/2017 4:53:15 PM By: Alric Quan Entered By: Alric Quan on 08/04/2017 08:13:21 Difiore, Jadrian L. ([id number]) -------------------------------------------------------------------------------- [place] Details Patient Name: GERELL, FORTSON. Date of Service: 08/04/2017 8:00 AM Medical Record Number: [id number] Patient Account Number: [id number] Date of Birth/Sex: 1956-05-02 (61 y.o. Male) Treating RN: Carolyne Fiscal, Debi Primary Care Enis Leatherwood: Esmond Camper Other Clinician: Referring Shandy Vi: Mingo Amber, JEFFREY Treating Dierdra Salameh/Extender: Melburn Hake, HOYT Weeks in Treatment: 5 Active Inactive ` Abuse / Safety / Falls / Self Care Management Nursing Diagnoses: Potential for falls Goals: Patient will remain injury free related to falls Date Initiated: 06/30/2017 Target Resolution Date: 08/20/2017 Goal Status: Active Interventions: Assess fall risk on admission and as needed Notes: ` Orientation to the Wound Care Program Nursing Diagnoses: Knowledge deficit related to the wound healing center program Goals: Patient/caregiver will verbalize understanding of the [place] Date Initiated: 06/30/2017 Target Resolution Date: 08/20/2017 Goal Status: Active Interventions: Provide education on orientation to the wound center Notes: ` Wound/Skin Impairment Nursing Diagnoses: Impaired tissue integrity Ketelsen, Yehia L. ([id number]) Goals: Ulcer/skin breakdown will have a volume reduction of 30% by week 4 Date Initiated: 06/30/2017 Target Resolution Date: 08/20/2017 Goal Status: Active Ulcer/skin breakdown will have a volume reduction of 50% by week 8 Date Initiated: 06/30/2017 Target Resolution Date: 08/20/2017 Goal Status: Active Ulcer/skin breakdown will have a volume reduction of 80% by week 12 Date Initiated: 06/30/2017 Target Resolution Date: 08/20/2017 Goal Status:  Active Ulcer/skin breakdown will heal within 14 weeks Date Initiated: 06/30/2017 Target Resolution Date: 08/20/2017 Goal Status: Active Interventions: Assess patient/caregiver ability to obtain necessary supplies Assess patient/caregiver ability to perform ulcer/skin care regimen upon admission and as needed Assess ulceration(s) every visit Notes: Electronic Signature(s) Signed: 08/04/2017 4:53:15 PM By: Alric Quan Entered By: Alric Quan on 08/04/2017 08:13:11 Bistline, [place]. ([id number]) -------------------------------------------------------------------------------- Pain Assessment Details Patient Name: TYSHEEM, ACCARDO. Date of Service: 08/04/2017 8:00 AM Medical Record Number: [id number] Patient Account Number: [id number] Date of Birth/Sex: 1956/09/17 (61 y.o. Male) Treating RN: Carolyne Fiscal, Debi Primary Care Zalea Pete: Esmond Camper Other Clinician: Referring Senica Crall: Mingo Amber, JEFFREY Treating Peace Jost/Extender: Melburn Hake, HOYT Weeks in Treatment: 5 Active Problems Location of Pain Severity and Description of Pain Patient Has Paino No Site Locations With Dressing Change: No Pain Management and Medication Current Pain Management: Electronic Signature(s) Signed: 08/04/2017 4:53:15 PM By: Alric Quan Entered By: Alric Quan on 08/04/2017 08:04:32 Doss, Harlee L. ([id number]) -------------------------------------------------------------------------------- Patient/Caregiver Education Details Patient Name: ANJELO, PULLMAN L. Date of Service: 08/04/2017 8:00 AM Medical Record Number: [id number] Patient Account Number: [id number] Date of Birth/Gender:  November 19, 1956 (61 y.o. Male) Treating RN: Carolyne Fiscal, Debi Primary Care Physician: Esmond Camper Other Clinician: Referring Physician: Esmond Camper Treating Physician/Extender: Sharalyn Ink in Treatment: 5 Education Assessment Education Provided To: Patient Education Topics Provided Wound/Skin  Impairment: Handouts: Other: change dressing as ordered Methods: Demonstration, Explain/Verbal Responses: State content correctly Electronic Signature(s) Signed: 08/04/2017 4:53:15 PM By: Alric Quan Entered By: Alric Quan on 08/04/2017 08:14:38 Zertuche, Victoria L. ([id number]) -------------------------------------------------------------------------------- Wound Assessment Details Patient Name: HARDY, HARCUM L. Date of Service: 08/04/2017 8:00 AM Medical Record Number: [id number] Patient Account Number: [id number] Date of Birth/Sex: May 13, 1956 (61 y.o. Male) Treating RN: Carolyne Fiscal, Debi Primary Care Christiana Gurevich: Esmond Camper Other Clinician: Referring Yalda Herd: Mingo Amber, JEFFREY Treating Merdith Boyd/Extender: STONE III, HOYT Weeks in Treatment: 5 Wound Status Wound Number: 1 Primary Etiology: Venous Leg Ulcer Wound Location: Right Malleolus - Medial Wound Status: Open Wounding Event: Gradually Appeared Comorbid History: Hypertension Date Acquired: 06/15/2017 Weeks Of Treatment: 5 Clustered Wound: No Photos Photo Uploaded By: Alric Quan on 08/04/2017 12:10:45 Wound Measurements Length: (cm) 6 Width: (cm) 1.2 Depth: (cm) 0.2 Area: (cm) 5.655 Volume: (cm) 1.131 % Reduction in Area: 47.7% % Reduction in Volume: -4.6% Epithelialization: Medium (34-66%) Tunneling: No Undermining: No Wound Description Full Thickness Without Exposed Classification: Support Structures Wound Margin: Flat and Intact Exudate Large Amount: Exudate Type: Serosanguineous Exudate Color: red, brown Foul Odor After Cleansing: No Slough/Fibrino Yes Wound Bed Granulation Amount: Medium (34-66%) Exposed Structure Granulation Quality: Pink Fascia Exposed: No Necrotic Amount: Medium (34-66%) Fat Layer (Subcutaneous Tissue) Exposed: No Tonche, Virlan L. ([id number]) Necrotic Quality: Adherent Slough Tendon Exposed: No Muscle Exposed: No Joint Exposed: No Bone Exposed:  No Periwound Skin Texture Texture Color No Abnormalities Noted: No No Abnormalities Noted: No Callus: No Atrophie Blanche: No Crepitus: No Cyanosis: No Excoriation: No Ecchymosis: Yes Induration: No Erythema: Yes Rash: No Erythema Location: Circumferential Scarring: No Hemosiderin Staining: No Mottled: No Moisture Pallor: No No Abnormalities Noted: No Rubor: No Dry / Scaly: No Maceration: Yes Temperature / Pain Temperature: No Abnormality Tenderness on Palpation: Yes Wound Preparation Ulcer Cleansing: Other: soap and water, Topical Anesthetic Applied: Other: lidocaine 4%, Treatment Notes Wound #1 (Right, Medial Malleolus) 1. Cleansed with: Clean wound with Normal Saline 2. Anesthetic Topical Lidocaine 4% cream to wound bed prior to debridement 4. Dressing Applied: Hydrafera Blue 5. Secondary Dressing Applied ABD Pad Kerlix/Conform 6. Footwear/Offloading device applied Ace wrap 7. Secured with Recruitment consultant) Signed: 08/04/2017 4:53:15 PM By: Alric Quan Entered By: Alric Quan on 08/04/2017 08:10:05 Garverick, Demba L. ([id number]) -------------------------------------------------------------------------------- Vitals Details Patient Name: ONDRE, SALVETTI L. Date of Service: 08/04/2017 8:00 AM Medical Record Number: [id number] Patient Account Number: [id number] Date of Birth/Sex: Dec 29, 1955 (61 y.o. Male) Treating RN: Carolyne Fiscal, Debi Primary Care Maceo Hernan: Esmond Camper Other Clinician: Referring Tonie Elsey: Mingo Amber, JEFFREY Treating Hermione Havlicek/Extender: STONE III, HOYT Weeks in Treatment: 5 Vital Signs Time Taken: 08:04 Temperature (F): 97.6 Height (in): 72 Pulse (bpm): 78 Weight (lbs): 245 Respiratory Rate (breaths/min): 18 Body Mass Index (BMI): 33.2 Blood Pressure (mmHg): 135/80 Reference Range: 80 - 120 mg / dl Electronic Signature(s) Signed: 08/04/2017 4:53:15 PM By: Alric Quan Entered By: Alric Quan on  08/04/2017 08:04:59

## 2017-08-11 ENCOUNTER — Encounter: Payer: 59 | Admitting: Internal Medicine

## 2017-08-11 DIAGNOSIS — L97312 Non-pressure chronic ulcer of right ankle with fat layer exposed: Secondary | ICD-10-CM | POA: Diagnosis not present

## 2017-08-11 DIAGNOSIS — I872 Venous insufficiency (chronic) (peripheral): Secondary | ICD-10-CM | POA: Diagnosis not present

## 2017-08-11 DIAGNOSIS — I10 Essential (primary) hypertension: Secondary | ICD-10-CM | POA: Diagnosis not present

## 2017-08-12 DIAGNOSIS — L89519 Pressure ulcer of right ankle, unspecified stage: Secondary | ICD-10-CM | POA: Diagnosis not present

## 2017-08-12 NOTE — Progress Notes (Signed)
[name], [name] ([id number]) Visit Report for 08/11/2017 Arrival Information Details Patient Name: [name] [name], [name] [name]. Date of Service: 08/11/2017 3:30 PM Medical Record Number: [id number] Patient Account Number: [id number] Date of Birth/Sex: 02-23-1956 (61 y.o. Male) Treating RN: [name] [name], [name] [name] Primary Care [name] [name]: [name] [name], [name] [name] Other Clinician: Referring [name] [name]: [name] [name], [name] [name] Treating [name] [name]/Extender: [name] [name] in Treatment: 6 Visit Information History Since Last Visit All ordered tests and consults were completed: No Patient Arrived: Ambulatory Added or deleted any medications: No Arrival Time: 15:39 Any new allergies or adverse reactions: No Accompanied By: self Had a fall or experienced change in No Transfer Assistance: None activities of daily living that may affect Patient Identification Verified: Yes risk of falls: Secondary Verification Process Yes Signs or symptoms of abuse/neglect since last No Completed: visito Patient Requires Transmission-Based No Hospitalized since last visit: No Precautions: Has Dressing in Place as Prescribed: Yes Patient Has Alerts: No Pain Present Now: No Electronic Signature(s) Signed: 08/11/2017 4:59:51 PM By: [name] [name] Entered By: [name] [name] on 08/11/2017 15:41:29 [name], [name] ([id number]) -------------------------------------------------------------------------------- Clinic Level of Care Assessment Details Patient Name: [name] [name], [name] [name]. Date of Service: 08/11/2017 3:30 PM Medical Record Number: [id number] Patient Account Number: [id number] Date of Birth/Sex: Nov 25, 1956 (61 y.o. Male) Treating RN: [name] [name], [name] [name] Primary Care Raima Geathers: [name] [name], [name] [name] Other Clinician: Referring [name] [name]: [name] [name], [name] [name] Treating [name] [name]/Extender: [name] [name] in Treatment: 6 Clinic Level of Care Assessment Items TOOL 4 Quantity Score X - Use when only an EandM is performed on FOLLOW-UP visit 1  0 ASSESSMENTS - Nursing Assessment / Reassessment X - Reassessment of Co-morbidities (includes updates in patient status) 1 10 X - Reassessment of Adherence to Treatment Plan 1 5 ASSESSMENTS - Wound and Skin Assessment / Reassessment X - Simple Wound Assessment / Reassessment - one wound 1 5 []  - Complex Wound Assessment / Reassessment - multiple wounds 0 []  - Dermatologic / Skin Assessment (not related to wound area) 0 ASSESSMENTS - Focused Assessment []  - Circumferential Edema Measurements - multi extremities 0 []  - Nutritional Assessment / Counseling / Intervention 0 []  - Lower Extremity Assessment (monofilament, tuning fork, pulses) 0 []  - Peripheral Arterial Disease Assessment (using hand held doppler) 0 ASSESSMENTS - Ostomy and/or Continence Assessment and Care []  - Incontinence Assessment and Management 0 []  - Ostomy Care Assessment and Management (repouching, etc.) 0 PROCESS - Coordination of Care X - Simple Patient / Family Education for ongoing care 1 15 []  - Complex (extensive) Patient / Family Education for ongoing care 0 []  - Staff obtains Programmer, systems, Records, Test Results / Process Orders 0 []  - Staff telephones HHA, Nursing Homes / Clarify orders / etc 0 []  - Routine Transfer to another Facility (non-emergent condition) 0 [name], [name] ([id number]) []  - Routine Hospital Admission (non-emergent condition) 0 []  - New Admissions / Biomedical engineer / Ordering NPWT, Apligraf, etc. 0 []  - Emergency Hospital Admission (emergent condition) 0 X - Simple Discharge Coordination 1 10 []  - Complex (extensive) Discharge Coordination 0 PROCESS - Special Needs []  - Pediatric / Minor Patient Management 0 []  - Isolation Patient Management 0 []  - Hearing / Language / Visual special needs 0 []  - Assessment of Community assistance (transportation, D/C planning, etc.) 0 []  - Additional assistance / Altered mentation 0 []  - Support Surface(s) Assessment (bed, cushion, seat, etc.)  0 INTERVENTIONS - Wound Cleansing / Measurement X - Simple Wound Cleansing - one wound 1 5 []  - Complex Wound Cleansing - multiple wounds 0 X - Wound Imaging (  photographs - any number of wounds) 1 5 []  - Wound Tracing (instead of photographs) 0 X - Simple Wound Measurement - one wound 1 5 []  - Complex Wound Measurement - multiple wounds 0 INTERVENTIONS - Wound Dressings X - Small Wound Dressing one or multiple wounds 1 10 []  - Medium Wound Dressing one or multiple wounds 0 []  - Large Wound Dressing one or multiple wounds 0 X - Application of Medications - topical 1 5 []  - Application of Medications - injection 0 INTERVENTIONS - Miscellaneous []  - External ear exam 0 [name], [name] ([id number]) []  - Specimen Collection (cultures, biopsies, blood, body fluids, etc.) 0 []  - Specimen(s) / Culture(s) sent or taken to Lab for analysis 0 []  - Patient Transfer (multiple staff / [name] [name] Lift / Similar devices) 0 []  - Simple Staple / Suture removal (25 or less) 0 []  - Complex Staple / Suture removal (26 or more) 0 []  - Hypo / Hyperglycemic Management (close monitor of Blood Glucose) 0 []  - Ankle / Brachial Index (ABI) - do not check if billed separately 0 X - Vital Signs 1 5 Has the patient been seen at the hospital within the last three years: Yes Total Score: 80 Level Of Care: New/Established - Level 3 Electronic Signature(s) Signed: 08/11/2017 4:59:51 PM By: [name] [name] Entered By: [name] [name] on 08/11/2017 16:52:35 [name], [name] ([id number]) -------------------------------------------------------------------------------- Encounter Discharge Information Details Patient Name: [name] [name], [name] [name]. Date of Service: 08/11/2017 3:30 PM Medical Record Number: [id number] Patient Account Number: [id number] Date of Birth/Sex: 09-Mar-1956 (61 y.o. Male) Treating RN: [name] [name], [name] [name] Primary Care Ilyse Tremain: [name] [name] Other Clinician: Referring [name] [name]: [name] [name], [name] [name] Treating  [name] [name]/Extender: [name] [name] in Treatment: 6 Encounter Discharge Information Items Discharge Pain Level: 0 Discharge Condition: Stable Ambulatory Status: Ambulatory Discharge Destination: Home Transportation: Private Auto Accompanied By: self Schedule Follow-up Appointment: Yes Medication Reconciliation completed and provided to Patient/Care No Nashia Remus: Provided on Clinical Summary of Care: 08/11/2017 Form Type Recipient Paper Patient RM Electronic Signature(s) Signed: 08/11/2017 4:59:51 PM By: [name] [name] Previous Signature: 08/11/2017 4:21:28 PM Version By: Ruthine Dose Entered By: [name] [name] on 08/11/2017 16:53:38 Smucker, Ivann L. ([id number]) -------------------------------------------------------------------------------- Lower Extremity Assessment Details Patient Name: [name] [name], [name] [name]. Date of Service: 08/11/2017 3:30 PM Medical Record Number: [id number] Patient Account Number: [id number] Date of Birth/Sex: Oct 24, 1956 (61 y.o. Male) Treating RN: [name] [name], [name] [name] Primary Care Shavelle Runkel: [name] [name] Other Clinician: Referring Treshawn Allen: [name] [name], [name] [name] Treating Ellia Knowlton/Extender: [name] [name] in Treatment: 6 Vascular Assessment Pulses: Dorsalis Pedis Palpable: [Right:Yes] Posterior Tibial Extremity colors, hair growth, and conditions: Extremity Color: [Right:Normal] Temperature of Extremity: [Right:Warm] Capillary Refill: [Right:< 3 seconds] Toe Nail Assessment Left: Right: Thick: No Discolored: No Deformed: No Improper Length and Hygiene: No Electronic Signature(s) Signed: 08/11/2017 4:59:51 PM By: [name] [name] Entered By: [name] [name] on 08/11/2017 15:51:53 Larocque, Cheryl L. ([id number]) -------------------------------------------------------------------------------- Multi Wound Chart Details Patient Name: [name] [name], [name] [name] L. Date of Service: 08/11/2017 3:30 PM Medical Record Number: [id number] Patient Account  Number: [id number] Date of Birth/Sex: Jul 14, 1956 (60 y.o. Male) Treating RN: [name] [name], [name] [name] Primary Care Jaceion Aday: [name] [name] Other Clinician: Referring Darrian Goodwill: [name] [name], [name] [name] Treating Sherika Kubicki/Extender: [name] [name] in Treatment: 6 Vital Signs Height(in): 72 Pulse(bpm): 98 Weight(lbs): 245 Blood Pressure 140/81 (mmHg): Body Mass Index(BMI): 33 Temperature(F): 97.7 Respiratory Rate 18 (breaths/min): Photos: [N/A:N/A] Wound Location: Right Malleolus - Medial N/A N/A Wounding Event: Gradually Appeared N/A N/A Primary Etiology: Venous Leg Ulcer N/A N/A Comorbid History: Hypertension N/A N/A Date Acquired: 06/15/2017 N/A N/A Suella [place]  of Treatment: 6 N/A N/A Wound Status: Open N/A N/A Measurements L x W x D 0.9x1.8x0.1 N/A N/A (cm) Area (cm) : 1.272 N/A N/A Volume (cm) : 0.127 N/A N/A % Reduction in Area: 88.20% N/A N/A % Reduction in Volume: 88.30% N/A N/A Classification: Full Thickness Without N/A N/A Exposed Support Structures Exudate Amount: Large N/A N/A Exudate Type: Serosanguineous N/A N/A Exudate Color: red, brown N/A N/A Wound Margin: Flat and Intact N/A N/A Granulation Amount: Large (67-100%) N/A N/A Granulation Quality: Pink N/A N/A Necrotic Amount: Small (1-33%) N/A N/A Gongaware, [name] ([id number]) Exposed Structures: Fascia: No N/A N/A Fat Layer (Subcutaneous Tissue) Exposed: No Tendon: No Muscle: No Joint: No Bone: No Epithelialization: Large (67-100%) N/A N/A Periwound Skin Texture: Excoriation: No N/A N/A Induration: No Callus: No Crepitus: No [name] [name]: No Scarring: No Periwound Skin Maceration: Yes N/A N/A Moisture: Dry/Scaly: No Periwound Skin Color: Ecchymosis: Yes N/A N/A Erythema: Yes Atrophie Blanche: No Cyanosis: No Hemosiderin Staining: No Mottled: No Pallor: No Rubor: No Erythema Location: Circumferential N/A N/A Temperature: No Abnormality N/A N/A Tenderness on Yes N/A N/A Palpation: Wound  Preparation: Ulcer Cleansing: Other: N/A N/A soap and water Topical Anesthetic Applied: Other: lidocaine 4% Treatment Notes Electronic Signature(s) Signed: 08/11/2017 5:02:24 PM By: Linton Ham MD Entered By: Linton Ham on 08/11/2017 16:40:27 Ueda, Winfield ([id number]) -------------------------------------------------------------------------------- [place] Details Patient Name: [name] [name], [name] [name]. Date of Service: 08/11/2017 3:30 PM Medical Record Number: [id number] Patient Account Number: [id number] Date of Birth/Sex: 1956-05-17 (61 y.o. Male) Treating RN: [name] [name], [name] [name] Primary Care Cigi Bega: [name] [name] Other Clinician: Referring Almena Hokenson: [name] [name], [name] [name] Treating Kyisha Fowle/Extender: [name] [name] in Treatment: 6 Active Inactive ` Abuse / Safety / Falls / Self Care Management Nursing Diagnoses: Potential for falls Goals: Patient will remain injury free related to falls Date Initiated: 06/30/2017 Target Resolution Date: 08/20/2017 Goal Status: Active Interventions: Assess fall risk on admission and as needed Notes: ` Orientation to the Wound Care Program Nursing Diagnoses: Knowledge deficit related to the wound healing center program Goals: Patient/caregiver will verbalize understanding of the [place] Date Initiated: 06/30/2017 Target Resolution Date: 08/20/2017 Goal Status: Active Interventions: Provide education on orientation to the wound center Notes: ` Wound/Skin Impairment Nursing Diagnoses: Impaired tissue integrity Kronick, Yaviel L. ([id number]) Goals: Ulcer/skin breakdown will have a volume reduction of 30% by week 4 Date Initiated: 06/30/2017 Target Resolution Date: 08/20/2017 Goal Status: Active Ulcer/skin breakdown will have a volume reduction of 50% by week 8 Date Initiated: 06/30/2017 Target Resolution Date: 08/20/2017 Goal Status: Active Ulcer/skin breakdown will have a volume  reduction of 80% by week 12 Date Initiated: 06/30/2017 Target Resolution Date: 08/20/2017 Goal Status: Active Ulcer/skin breakdown will heal within 14 weeks Date Initiated: 06/30/2017 Target Resolution Date: 08/20/2017 Goal Status: Active Interventions: Assess patient/caregiver ability to obtain necessary supplies Assess patient/caregiver ability to perform ulcer/skin care regimen upon admission and as needed Assess ulceration(s) every visit Notes: Electronic Signature(s) Signed: 08/11/2017 4:59:51 PM By: [name] [name] Entered By: [name] [name] on 08/11/2017 15:51:59 Campise, [name] ([id number]) -------------------------------------------------------------------------------- Pain Assessment Details Patient Name: [name] [name], [name] [name]. Date of Service: 08/11/2017 3:30 PM Medical Record Number: [id number] Patient Account Number: [id number] Date of Birth/Sex: [birth year] (61 y.o. Male) Treating RN: [name] [name], [name] [name] Primary Care Russ Looper: [name] [name] Other Clinician: Referring Lennyx Verdell: [name] [name], [name] [name] Treating Devonda Pequignot/Extender: [name] [name] in Treatment: 6 Active Problems Location of Pain Severity and Description of Pain Patient Has Paino No Site Locations With Dressing Change: No Pain Management and Medication  Current Pain Management: Electronic Signature(s) Signed: 08/11/2017 4:59:51 PM By: [name] [name] Entered By: [name] [name] on 08/11/2017 15:41:35 [name] [name], [name] ([id number]) -------------------------------------------------------------------------------- Patient/Caregiver Education Details Patient Name: KHAIRI, GARMAN L. Date of Service: 08/11/2017 3:30 PM Medical Record Number: [id number] Patient Account Number: [id number] Date of Birth/Gender: 1956/02/11 (61 y.o. Male) Treating RN: [name] [name], [name] [name] Primary Care Physician: [name] [name] Other Clinician: Referring Physician: Esmond [name] Treating Physician/Extender: [name] [name] in  Treatment: 6 Education Assessment Education Provided To: Patient Education Topics Provided Wound/Skin Impairment: Handouts: Other: change dressing as ordered Methods: Demonstration, Explain/Verbal Responses: State content correctly Electronic Signature(s) Signed: 08/11/2017 4:59:51 PM By: [name] [name] Entered By: [name] [name] on 08/11/2017 16:53:51 Ridolfi, [name] ([id number]) -------------------------------------------------------------------------------- Wound Assessment Details Patient Name: [name] [name], [name] [name] L. Date of Service: 08/11/2017 3:30 PM Medical Record Number: [id number] Patient Account Number: [id number] Date of Birth/Sex: 1956-08-17 (61 y.o. Male) Treating RN: [name] [name], [name] [name] Primary Care Mehtab Dolberry: [name] [name] Other Clinician: Referring Wavie Hashimi: [name] [name], [name] [name] Treating Dalicia Kisner/Extender: Ricard Dillon Weeks in Treatment: 6 Wound Status Wound Number: 1 Primary Etiology: Venous Leg Ulcer Wound Location: Right Malleolus - Medial Wound Status: Open Wounding Event: Gradually Appeared Comorbid History: Hypertension Date Acquired: 06/15/2017 Weeks Of Treatment: 6 Clustered Wound: No Photos Photo Uploaded By: [name] [name] on 08/11/2017 16:20:46 Wound Measurements Length: (cm) 0.9 Width: (cm) 1.8 Depth: (cm) 0.1 Area: (cm) 1.272 Volume: (cm) 0.127 % Reduction in Area: 88.2% % Reduction in Volume: 88.3% Epithelialization: Large (67-100%) Tunneling: No Undermining: No Wound Description Full Thickness Without Exposed Classification: Support Structures Wound Margin: Flat and Intact Exudate Large Amount: Exudate Type: Serosanguineous Exudate Color: red, brown Foul Odor After Cleansing: No Slough/Fibrino Yes Wound Bed Granulation Amount: Large (67-100%) Exposed Structure Granulation Quality: Pink Fascia Exposed: No Necrotic Amount: Small (1-33%) Fat Layer (Subcutaneous Tissue) Exposed: No Michiels, Matheau L. ([id number]) Necrotic  Quality: Adherent Slough Tendon Exposed: No Muscle Exposed: No Joint Exposed: No Bone Exposed: No Periwound Skin Texture Texture Color No Abnormalities Noted: No No Abnormalities Noted: No Callus: No Atrophie Blanche: No Crepitus: No Cyanosis: No Excoriation: No Ecchymosis: Yes Induration: No Erythema: Yes [name] [name]: No Erythema Location: Circumferential Scarring: No Hemosiderin Staining: No Mottled: No Moisture Pallor: No No Abnormalities Noted: No Rubor: No Dry / Scaly: No Maceration: Yes Temperature / Pain Temperature: No Abnormality Tenderness on Palpation: Yes Wound Preparation Ulcer Cleansing: Other: soap and water, Topical Anesthetic Applied: Other: lidocaine 4%, Treatment Notes Wound #1 (Right, Medial Malleolus) 1. Cleansed with: Clean wound with Normal Saline 2. Anesthetic Topical Lidocaine 4% cream to wound bed prior to debridement 4. Dressing Applied: Hydrogel Promogran 5. Secondary Dressing Applied ABD Pad Dry Gauze Kerlix/Conform 6. Footwear/Offloading device applied [name] wrap 7. Secured with Recruitment consultant) Signed: 08/11/2017 4:59:51 PM By: [name] [name] Entered By: [name] [name] on 08/11/2017 15:45:50 Flori, Damyon L. ([id number]) Sartin, Hughes L. ([id number]) -------------------------------------------------------------------------------- Vitals Details Patient Name: JHORDAN, MCKIBBEN L. Date of Service: 08/11/2017 3:30 PM Medical Record Number: [id number] Patient Account Number: [id number] Date of Birth/Sex: 06/08/1956 (61 y.o. Male) Treating RN: [name] [name], [name] [name] Primary Care Bryson Palen: [name] [name] Other Clinician: Referring Shaneya Taketa: [name] [name], [name] [name] Treating Ariyanah Aguado/Extender: [name] [name] in Treatment: 6 Vital Signs Time Taken: 15:41 Temperature (F): 97.7 Height (in): 72 Pulse (bpm): 98 Weight (lbs): 245 Respiratory Rate (breaths/min): 18 Body Mass Index (BMI): 33.2 Blood Pressure (mmHg):  140/81 Reference Range: 80 - 120 mg / dl Electronic Signature(s) Signed: 08/11/2017 4:59:51 PM By: [name] [name] Entered By: [name] [name] on 08/11/2017 15:41:51

## 2017-08-12 NOTE — Progress Notes (Signed)
Wolford, Zeferino L. ([id number]) Visit Report for 08/11/2017 HPI Details Patient Name: [name] [name], [name] [name]. Date of Service: 08/11/2017 3:30 PM Medical Record Number: [id number] Patient Account Number: [id number] Date of Birth/Sex: Feb 06, 1956 (61 y.o. Male) Treating RN: Carolyne Fiscal, Debi Primary Care Provider: Esmond Camper Other Clinician: Referring Provider: Mingo Amber, JEFFREY Treating Provider/Extender: Tito Dine in Treatment: 6 History of Present Illness HPI Description: 06/30/17 on evaluation today patient presents with a recurrent ulcer of the medial right ankle. He has had a region of atrophy and discoloration since he had vein strippin "years ago". Nonetheless she tells me at this point on evaluation today that this area reopened a 2 weeks ago and he initially contacted the [facility] wound center for an appointment but the appointment was so far out that he was unable to wait till that point. He therefore made an appointment here in [place] per the recommendation. He has been tolerating the wound from the standpoint of pain although he tells me it's not too painful. 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 He knows he should be using it however. Fortunately there's no evidence of infection in the wound appears to be doing well. 07/06/17; patient admitted to the clinic last week with a recurrent venous insufficiency ulcer on the medial right ankle. Fairly extensive open wound with surrounding erythematous discolored scan as well as small dilated veins inferiorly in the wound and medially in the foot. He describes tenderness actually lower down in the foot over these veins. His history goes back to vein stripping on the right leg 15 years ago. He was cared for in [place] when are sister clinic by Dr. Con Memos in 2016 at that time with a wound  in the same spot. After he left the clinic he went on to laser [facility] I'll need to check these records. Per our intake nurse the wound looks better today. 07/14/17; patient complains of a lot of pain in the lower heel below the wound. There is discoloration here but certainly no different from last week. I did review the notes from Dr. Kellie Simmering he last saw him in November 2016 post successful laser ablation of the right greater saphenous vein. Was noted he had severe skin changes in that area even at that time 07/21/17; this patient continues to do really well although he is still having moderate drainage. Chronic venous insufficiency wound area with significant venous inflammation. He has had previous ablation by Dr. Kellie Simmering in 2016 of the right greater saphenous vein. Question is on the right medial ankle extending into the medial aspect of the calcaneus. We have been using silver alginate 07/28/17 on evaluation today patient tells me that he has been having some increased discomfort in the right lower extremity wound for the past several days although today it is actually better. He also tells me that he had some fevers although not measured as well as chills but again that is better today really it was only last night that he experienced those symptoms. Unfortunately she feels like there is a concern that he could have an infection in regard to the wound although he is not really certain about this. He fortunately has no bowel or bladder dysfunction no unexpected nausea vomiting or diarrhea. His pain today is much better around the a 2 out of  10 at rest however with cleansing of the wound this gets more severe rated to be a 5-6 out of 10 08/04/17 on evaluation today patient's right medial ankle ulcer appears to be doing much better. He has noted a dramatic improvement in the overall discomfort which I think is an excellent step and leap from where he was last  week with 5-6 out of 10 pain. Fortunately there appears to be no evidence of infection. [name] [name], [name] ([id number]) No fevers, chills, nausea, or vomiting noted at this time. 08/11/17; the patient really is doing quite nicely. Superficial wound now with nice granulation tissue. Much smaller. He is using his own external ace wrap and Hydrofera Blue Electronic Signature(s) Signed: 08/11/2017 5:02:24 PM By: Linton Ham MD Entered By: Linton Ham on 08/11/2017 16:41:34 [name] [name], [name] [name] ([id number]) -------------------------------------------------------------------------------- Physical Exam Details Patient Name: Adamski, [name] Date of Service: 08/11/2017 3:30 PM Medical Record Number: [id number] Patient Account Number: [id number] Date of Birth/Sex: [birth year] (61 y.o. Male) Treating RN: Carolyne Fiscal, Debi Primary Care Provider: Esmond Camper Other Clinician: Referring Provider: Mingo Amber, JEFFREY Treating Provider/Extender: Ricard Dillon Weeks in Treatment: 6 Constitutional Sitting or standing Blood Pressure is within target range for patient.. Pulse regular and within target range for patient.Marland Kitchen Respirations regular, non-labored and within target range.. Temperature is normal and within the target range for the patient.Marland Kitchen appears in no distress. Eyes Conjunctivae clear. No discharge. Respiratory Respiratory effort is easy and symmetric bilaterally. Rate is normal at rest and on room air.. Cardiovascular Pedal pulses palpable. Heme around the wound is controlled however he has a regular edema above this in the midcalf up. No evidence of DVT. Lymphatic None palpable in the popliteal or inguinal area. Psychiatric No evidence of depression, anxiety, or agitation. Calm, cooperative, and communicative. Appropriate interactions and affect.. Notes Wound exam; the patient's wound is made really nice progression. Healthy looking granulation no evidence of surrounding  infection Electronic Signature(s) Signed: 08/11/2017 5:02:24 PM By: Linton Ham MD Entered By: Linton Ham on 08/11/2017 16:44:51 [name] [name], [name] [name] ([id number]) -------------------------------------------------------------------------------- Physician Orders Details Patient Name: RUTGER, SALTON L. Date of Service: 08/11/2017 3:30 PM Medical Record Number: [id number] Patient Account Number: [id number] Date of Birth/Sex: Apr 23, 1956 (61 y.o. Male) Treating RN: Carolyne Fiscal, Debi Primary Care Provider: Esmond Camper Other Clinician: Referring Provider: Mingo Amber, JEFFREY Treating Provider/Extender: Tito Dine in Treatment: 6 Verbal / Phone Orders: Yes ClinicianCarolyne Fiscal, Debi Read Back and Verified: Yes Diagnosis Coding Wound Cleansing Wound #1 Right,Medial Malleolus o Clean wound with Normal Saline. o Cleanse wound with mild soap and water o May Shower, gently pat wound dry prior to applying new dressing. Anesthetic Wound #1 Right,Medial Malleolus o Topical Lidocaine 4% cream applied to wound bed prior to debridement Skin Barriers/Peri-Wound Care Wound #1 Right,Medial Malleolus o Triamcinolone Acetonide Ointment Primary Wound Dressing Wound #1 Right,Medial Malleolus o Hydrogel o Promogran - moisten with hydrogel Secondary Dressing Wound #1 Right,Medial Malleolus o ABD pad o Conform/Kerlix Dressing Change Frequency Wound #1 Right,Medial Malleolus o Change dressing every other day. Follow-up Appointments Wound #1 Right,Medial Malleolus o Return Appointment in 1 week. o Nurse Visit as needed Edema Control Wound #1 Right,Medial Malleolus [name] [name], [name] ([id number]) o Elevate legs to the level of the heart and pump ankles as often as possible o Other: - ace bandage Additional Orders / Instructions Wound #1 Right,Medial Malleolus o Increase protein intake. o Other: - Please add vitamin A, vitamin C and zinc supplements to  your diet Electronic Signature(s) Signed: 08/11/2017 4:59:51 PM  By: Alric Quan Signed: 08/11/2017 5:02:24 PM By: Linton Ham MD Entered By: Alric Quan on 08/11/2017 15:53:16 [name] [name], [name] ([id number]) -------------------------------------------------------------------------------- Problem List Details Patient Name: JASYN, MEY L. Date of Service: 08/11/2017 3:30 PM Medical Record Number: [id number] Patient Account Number: [id number] Date of Birth/Sex: 06/28/1956 (61 y.o. 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 Date of Service: 08/11/2017 3:30 PM Medical Record Number: [id number] Patient Account Number: [id number] Date of Birth/Sex: 04-22-1956 (61 y.o. Male) Treating RN: Carolyne Fiscal, Debi Primary Care Provider: Esmond Camper Other Clinician: Referring Provider: Mingo Amber, JEFFREY Treating Provider/Extender: Tito Dine in Treatment: 6 Subjective History of Present Illness (HPI) 06/30/17 on evaluation today patient presents with a recurrent ulcer of the medial right ankle. He has had a region of atrophy and discoloration since he had vein strippin "years ago". Nonetheless she tells me at this point on  evaluation today that this area reopened a 2 weeks ago and he initially contacted the [facility] wound center for an appointment but the appointment was so far out that he was unable to wait till that point. He therefore made an appointment here in [place] per the recommendation. He has been tolerating the wound from the standpoint of pain although he tells me it's not too painful. 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 He knows he should be using it however. Fortunately there's no evidence of infection in the wound appears to be doing well. 07/06/17; patient admitted to the clinic last week with a recurrent venous insufficiency ulcer on the medial right ankle. Fairly extensive open wound with surrounding erythematous discolored scan as well as small dilated veins inferiorly in the wound and medially in the foot. He describes tenderness actually lower down in the foot over these veins. His history goes back to vein stripping on the right leg 15 years ago. He was cared for in [place] when are sister clinic by Dr. Con Memos in 2016 at that time with a wound in the same spot. After he left the clinic he went on to laser ablation [facility] I'll need to check these records. Per our intake nurse the wound looks better today. 07/14/17; patient complains of a lot of pain in the lower heel below the wound. There is discoloration here but certainly no different from last week. I did review the notes from Dr. Kellie Simmering he last saw him in November 2016 post successful laser ablation of the right greater saphenous vein. Was noted he had severe skin changes in that area even at that time 07/21/17; this patient continues to do really well although he is still having moderate drainage. Chronic venous insufficiency wound area with significant venous  inflammation. He has had previous ablation by Dr. Kellie Simmering in 2016 of the right greater saphenous vein. Question is on the right medial ankle extending  into the medial aspect of the calcaneus. We have been using silver alginate 07/28/17 on evaluation today patient tells me that he has been having some increased discomfort in the right lower extremity wound for the past several days although today it is actually better. He also tells me that he had some fevers although not measured as well as chills but again that is better today really it was only last night that he experienced those symptoms. Unfortunately she feels like there is a concern that he could have an infection in regard to the wound although he is not really certain about this. He fortunately has no bowel or bladder dysfunction no unexpected nausea vomiting or diarrhea. His pain today is much better around the a 2 out of 10 at rest however with cleansing of the wound this gets more severe rated to be a 5-6 out of 10 08/04/17 on evaluation today patient's right medial ankle ulcer appears to be doing much better. He has noted a dramatic improvement in the overall discomfort which I think is an excellent step and leap from where he was last week with 5-6 out of 10 pain. Fortunately there appears to be no evidence of infection. No fevers, chills, nausea, or vomiting noted at this time. 08/11/17; the patient really is doing quite nicely. Superficial wound now with nice granulation tissue. Much [name] [name], [name] ([id number]) smaller. He is using his own external ace wrap and Hydrofera Blue Objective Constitutional Sitting or standing Blood Pressure is within target range for patient.. Pulse regular and within target range for patient.Marland Kitchen Respirations regular, non-labored and within target range.. Temperature is normal and within the target range for the patient.Marland Kitchen appears in no distress. Vitals Time Taken: 3:41 PM, Height: 72 in, Weight: 245 lbs,  BMI: 33.2, Temperature: 97.7 F, Pulse: 98 bpm, Respiratory Rate: 18 breaths/min, Blood Pressure: 140/81 mmHg. Eyes Conjunctivae clear. No discharge. Respiratory Respiratory effort is easy and symmetric bilaterally. Rate is normal at rest and on room air.. Cardiovascular Pedal pulses palpable. Heme around the wound is controlled however he has a regular edema above this in the midcalf up. No evidence of DVT. Lymphatic None palpable in the popliteal or inguinal area. Psychiatric No evidence of depression, anxiety, or agitation. Calm, cooperative, and communicative. Appropriate interactions and affect.. General Notes: Wound exam; the patient's wound is made really nice progression. Healthy looking granulation no evidence of surrounding infection Integumentary (Hair, Skin) Wound #1 status is Open. Original cause of wound was Gradually Appeared. The wound is located on the Right,Medial Malleolus. The wound measures 0.9cm length x 1.8cm width x 0.1cm depth; 1.272cm^2 area and 0.127cm^3 volume. There is no tunneling or undermining noted. There is a large amount of serosanguineous drainage noted. The wound margin is flat and intact. There is large (67-100%) pink granulation within the wound bed. There is a small (1-33%) amount of necrotic tissue within the wound bed including Adherent Slough. The periwound skin appearance exhibited: Maceration, Ecchymosis, Erythema. The periwound skin appearance did not exhibit: Callus, Crepitus, Excoriation, Induration, Rash, Scarring, [name] [name], [name] ([id number]) Dry/Scaly, Atrophie Blanche, Cyanosis, Hemosiderin Staining, Mottled, Pallor, Rubor. The surrounding wound skin color is noted with erythema which is circumferential. Periwound temperature was noted as No Abnormality. The periwound has tenderness on palpation. Assessment Active Problems ICD-10 I87.2 - Venous insufficiency (chronic) (peripheral) L97.312 - Non-pressure chronic ulcer of right  ankle with fat layer exposed I10 - Essential (primary) hypertension Plan Wound Cleansing: Wound #1 Right,Medial Malleolus: Clean wound with Normal Saline.  Cleanse wound with mild soap and water May Shower, gently pat wound dry prior to applying new dressing. Anesthetic: Wound #1 Right,Medial Malleolus: Topical Lidocaine 4% cream applied to wound bed prior to debridement Skin Barriers/Peri-Wound Care: Wound #1 Right,Medial Malleolus: Triamcinolone Acetonide Ointment Primary Wound Dressing: Wound #1 Right,Medial Malleolus: Hydrogel Promogran - moisten with hydrogel Secondary Dressing: Wound #1 Right,Medial Malleolus: ABD pad Conform/Kerlix Dressing Change Frequency: Wound #1 Right,Medial Malleolus: Change dressing every other day. Follow-up Appointments: Wound #1 Right,Medial Malleolus: Return Appointment in 1 week. Nurse Visit as needed Edema Control: [name] [name], [name] ([id number]) Wound #1 Right,Medial Malleolus: Elevate legs to the level of the heart and pump ankles as often as possible Other: - ace bandage Additional Orders / Instructions: Wound #1 Right,Medial Malleolus: Increase protein intake. Other: - Please add vitamin A, vitamin C and zinc supplements to your diet #1 patient complaining about 100 ferrous sticking to the wound. #2 change primary dressing to collagen moistened with hydrogel/K-Y jelly #3 is making good progress Electronic Signature(s) Signed: 08/11/2017 5:02:24 PM By: Linton Ham MD Entered By: Linton Ham on 08/11/2017 16:46:07 [name] [name], [name] ([id number]) -------------------------------------------------------------------------------- SuperBill Details Patient Name: [name] [name], [name] Date of Service: 08/11/2017 Medical Record Number: [id number] Patient Account Number: [id number] Date of Birth/Sex: 12/29/1955 (61 y.o. 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

## 2017-08-13 MED FILL — HYDROCHLOROTHIAZIDE 25 MG T: 25 | 90 days supply | Qty: 90 | Fill #3

## 2017-08-16 DIAGNOSIS — L03116 Cellulitis of left lower limb: Secondary | ICD-10-CM | POA: Diagnosis not present

## 2017-08-16 DIAGNOSIS — M7042 Prepatellar bursitis, left knee: Secondary | ICD-10-CM | POA: Diagnosis not present

## 2017-08-16 MED FILL — CEPHALEXIN 500 MG CAPSULE: 500 | 7 days supply | Qty: 28 | Fill #0

## 2017-08-16 MED FILL — DOXYCYCLINE MONO 100 MG TAB: 100 | 10 days supply | Qty: 20 | Fill #1

## 2017-08-17 ENCOUNTER — Encounter: Payer: 59 | Admitting: Internal Medicine

## 2017-08-17 DIAGNOSIS — L97312 Non-pressure chronic ulcer of right ankle with fat layer exposed: Secondary | ICD-10-CM | POA: Diagnosis not present

## 2017-08-17 DIAGNOSIS — I10 Essential (primary) hypertension: Secondary | ICD-10-CM | POA: Diagnosis not present

## 2017-08-17 DIAGNOSIS — I872 Venous insufficiency (chronic) (peripheral): Secondary | ICD-10-CM | POA: Diagnosis not present

## 2017-08-19 NOTE — Progress Notes (Signed)
Mackie, Khiree L. ([id number]) Visit Report for 08/17/2017 Arrival Information Details Patient Name: [name] [name], [name] [name]. Date of Service: 08/17/2017 2:30 PM Medical Record Number: [id number] Patient Account Number: [id number] Date of Birth/Sex: 04-13-1956 (61 y.o. Male) Treating RN: Carolyne Fiscal, Debi Primary Care Derwin Reddy: Mingo Amber, JEFFREY Other Clinician: Referring Thatiana Renbarger: Mingo Amber, JEFFREY Treating Orvilla Truett/Extender: Tito Dine in Treatment: 6 Visit Information History Since Last Visit All ordered tests and consults were completed: No Patient Arrived: Ambulatory Added or deleted any medications: No Arrival Time: 14:37 Any new allergies or adverse reactions: No Accompanied By: self Had a fall or experienced change in No Transfer Assistance: None activities of daily living that may affect Patient Identification Verified: Yes risk of falls: Secondary Verification Process Yes Signs or symptoms of abuse/neglect since last No Completed: visito Patient Requires Transmission-Based No Hospitalized since last visit: No Precautions: Has Dressing in Place as Prescribed: Yes Patient Has Alerts: No Pain Present Now: No Electronic Signature(s) Signed: 08/17/2017 4:37:15 PM By: Alric Quan Entered By: Alric Quan on 08/17/2017 14:37:56 Espey, Kimberley L. ([id number]) -------------------------------------------------------------------------------- Clinic Level of Care Assessment Details Patient Name: [name] [name]. Date of Service: 08/17/2017 2:30 PM Medical Record Number: [id number] Patient Account Number: [id number] Date of Birth/Sex: May 24, 1956 (61 y.o. Male) Treating RN: Carolyne Fiscal, Debi Primary Care Lauryl Seyer: Mingo Amber, JEFFREY Other Clinician: Referring Mitsugi Schrader: Mingo Amber, JEFFREY Treating Raeshaun Simson/Extender: Tito Dine in Treatment: 6 Clinic Level of Care Assessment Items TOOL 4 Quantity Score X - Use when only an EandM is performed on FOLLOW-UP visit 1  0 ASSESSMENTS - Nursing Assessment / Reassessment X - Reassessment of Co-morbidities (includes updates in patient status) 1 10 X - Reassessment of Adherence to Treatment Plan 1 5 ASSESSMENTS - Wound and Skin Assessment / Reassessment X - Simple Wound Assessment / Reassessment - one wound 1 5 []  - Complex Wound Assessment / Reassessment - multiple wounds 0 []  - Dermatologic / Skin Assessment (not related to wound area) 0 ASSESSMENTS - Focused Assessment []  - Circumferential Edema Measurements - multi extremities 0 []  - Nutritional Assessment / Counseling / Intervention 0 []  - Lower Extremity Assessment (monofilament, tuning fork, pulses) 0 []  - Peripheral Arterial Disease Assessment (using hand held doppler) 0 ASSESSMENTS - Ostomy and/or Continence Assessment and Care []  - Incontinence Assessment and Management 0 []  - Ostomy Care Assessment and Management (repouching, etc.) 0 PROCESS - Coordination of Care X - Simple Patient / Family Education for ongoing care 1 15 []  - Complex (extensive) Patient / Family Education for ongoing care 0 []  - Staff obtains Programmer, systems, Records, Test Results / Process Orders 0 []  - Staff telephones HHA, Nursing Homes / Clarify orders / etc 0 []  - Routine Transfer to another Facility (non-emergent condition) 0 Raske, North L. ([id number]) []  - Routine Hospital Admission (non-emergent condition) 0 []  - New Admissions / Biomedical engineer / Ordering NPWT, Apligraf, etc. 0 []  - Emergency Hospital Admission (emergent condition) 0 X - Simple Discharge Coordination 1 10 []  - Complex (extensive) Discharge Coordination 0 PROCESS - Special Needs []  - Pediatric / Minor Patient Management 0 []  - Isolation Patient Management 0 []  - Hearing / Language / Visual special needs 0 []  - Assessment of Community assistance (transportation, D/C planning, etc.) 0 []  - Additional assistance / Altered mentation 0 []  - Support Surface(s) Assessment (bed, cushion, seat, etc.)  0 INTERVENTIONS - Wound Cleansing / Measurement X - Simple Wound Cleansing - one wound 1 5 []  - Complex Wound Cleansing - multiple wounds 0 X - Wound Imaging (  photographs - any number of wounds) 1 5 []  - Wound Tracing (instead of photographs) 0 X - Simple Wound Measurement - one wound 1 5 []  - Complex Wound Measurement - multiple wounds 0 INTERVENTIONS - Wound Dressings X - Small Wound Dressing one or multiple wounds 1 10 []  - Medium Wound Dressing one or multiple wounds 0 []  - Large Wound Dressing one or multiple wounds 0 X - Application of Medications - topical 1 5 []  - Application of Medications - injection 0 INTERVENTIONS - Miscellaneous []  - External ear exam 0 Anello, Emmanuell L. ([id number]) []  - Specimen Collection (cultures, biopsies, blood, body fluids, etc.) 0 []  - Specimen(s) / Culture(s) sent or taken to Lab for analysis 0 []  - Patient Transfer (multiple staff / Harrel Lemon Lift / Similar devices) 0 []  - Simple Staple / Suture removal (25 or less) 0 []  - Complex Staple / Suture removal (26 or more) 0 []  - Hypo / Hyperglycemic Management (close monitor of Blood Glucose) 0 []  - Ankle / Brachial Index (ABI) - do not check if billed separately 0 X - Vital Signs 1 5 Has the patient been seen at the hospital within the last three years: Yes Total Score: 80 Level Of Care: New/Established - Level 3 Electronic Signature(s) Signed: 08/17/2017 4:37:15 PM By: Alric Quan Entered By: Alric Quan on 08/17/2017 16:36:04 Coddington, Millie L. ([id number]) -------------------------------------------------------------------------------- Encounter Discharge Information Details Patient Name: [name] [name]. Date of Service: 08/17/2017 2:30 PM Medical Record Number: [id number] Patient Account Number: [id number] Date of Birth/Sex: 1956/12/19 (61 y.o. Male) Treating RN: Carolyne Fiscal, Debi Primary Care Aracelis Ulrey: Esmond Camper Other Clinician: Referring Brittany Amirault: Mingo Amber, JEFFREY Treating  Yan Okray/Extender: Tito Dine in Treatment: 6 Encounter Discharge Information Items Discharge Pain Level: 0 Discharge Condition: Stable Ambulatory Status: Ambulatory Discharge Destination: Home Transportation: Private Auto Accompanied By: self Schedule Follow-up Appointment: Yes Medication Reconciliation completed and provided to Patient/Care No Laquinton Bihm: Provided on Clinical Summary of Care: 08/17/2017 Form Type Recipient Paper Patient RM Electronic Signature(s) Signed: 08/18/2017 9:32:52 AM By: Ruthine Dose Entered By: Ruthine Dose on 08/17/2017 15:15:17 Rathe, Carle L. ([id number]) -------------------------------------------------------------------------------- Lower Extremity Assessment Details Patient Name: GIORGI, DEBRUIN. Date of Service: 08/17/2017 2:30 PM Medical Record Number: [id number] Patient Account Number: [id number] Date of Birth/Sex: Jan 06, 1956 (61 y.o. Male) Treating RN: Carolyne Fiscal, Debi Primary Care Tasha Diaz: Esmond Camper Other Clinician: Referring Franciscojavier Wronski: Mingo Amber, JEFFREY Treating Dacota Devall/Extender: Tito Dine in Treatment: 6 Vascular Assessment Pulses: Dorsalis Pedis Palpable: [Right:Yes] Posterior Tibial Extremity colors, hair growth, and conditions: Extremity Color: [Right:Normal] Temperature of Extremity: [Right:Warm] Capillary Refill: [Right:< 3 seconds] Toe Nail Assessment Left: Right: Thick: No Discolored: No Deformed: No Improper Length and Hygiene: No Electronic Signature(s) Signed: 08/17/2017 4:37:15 PM By: Alric Quan Entered By: Alric Quan on 08/17/2017 14:45:25 Otterness, Ricardo L. ([id number]) -------------------------------------------------------------------------------- Multi Wound Chart Details Patient Name: JHALEN, ELEY L. Date of Service: 08/17/2017 2:30 PM Medical Record Number: [id number] Patient Account Number: [id number] Date of Birth/Sex: May 06, 1956 (61 y.o. Male) Treating RN:  Carolyne Fiscal, Debi Primary Care Fidelis Loth: Esmond Camper Other Clinician: Referring Zaydah Nawabi: Mingo Amber, JEFFREY Treating Giomar Gusler/Extender: Tito Dine in Treatment: 6 Vital Signs Height(in): 72 Pulse(bpm): 87 Weight(lbs): 245 Blood Pressure 135/85 (mmHg): Body Mass Index(BMI): 33 Temperature(F): 98.0 Respiratory Rate 18 (breaths/min): Photos: [1:No Photos] [N/A:N/A] Wound Location: [1:Right Malleolus - Medial] [N/A:N/A] Wounding Event: [1:Gradually Appeared] [N/A:N/A] Primary Etiology: [1:Venous Leg Ulcer] [N/A:N/A] Comorbid History: [1:Hypertension] [N/A:N/A] Date Acquired: [1:06/15/2017] [N/A:N/A] Weeks of Treatment: [1:6] [N/A:N/A] Wound Status: [1:Open] [N/A:N/A] Measurements L x W  x D 0.8x1.6x0.1 [N/A:N/A] (cm) Area (cm) : [1:1.005] [N/A:N/A] Volume (cm) : [1:0.101] [N/A:N/A] % Reduction in Area: [1:90.70%] [N/A:N/A] % Reduction in Volume: 90.70% [N/A:N/A] Classification: [1:Full Thickness Without Exposed Support Structures] [N/A:N/A] Exudate Amount: [1:Large] [N/A:N/A] Exudate Type: [1:Serosanguineous] [N/A:N/A] Exudate Color: [1:red, brown] [N/A:N/A] Wound Margin: [1:Flat and Intact] [N/A:N/A] Granulation Amount: [1:Large (67-100%)] [N/A:N/A] Granulation Quality: [1:Pink] [N/A:N/A] Necrotic Amount: [1:Small (1-33%)] [N/A:N/A] Exposed Structures: [1:Fascia: No Fat Layer (Subcutaneous Tissue) Exposed: No Tendon: No Muscle: No] [N/A:N/A] Joint: No Bone: No Epithelialization: Large (67-100%) N/A N/A Periwound Skin Texture: Excoriation: No N/A N/A Induration: No Callus: No Crepitus: No Rash: No Scarring: No Periwound Skin Maceration: Yes N/A N/A Moisture: Dry/Scaly: No Periwound Skin Color: Ecchymosis: Yes N/A N/A Erythema: Yes Atrophie Blanche: No Cyanosis: No Hemosiderin Staining: No Mottled: No Pallor: No Rubor: No Erythema Location: Circumferential N/A N/A Temperature: No Abnormality N/A N/A Tenderness on Yes N/A  N/A Palpation: Wound Preparation: Ulcer Cleansing: Other: N/A N/A soap and water Topical Anesthetic Applied: Other: lidocaine 4% Treatment Notes Electronic Signature(s) Signed: 08/17/2017 4:37:15 PM By: Alric Quan Entered By: Alric Quan on 08/17/2017 14:55:34 Doolittle, Zaedyn L. ([id number]) -------------------------------------------------------------------------------- Gentry Details Patient Name: RICHEY, DOOLITTLE. Date of Service: 08/17/2017 2:30 PM Medical Record Number: [id number] Patient Account Number: [id number] Date of Birth/Sex: 10/02/1956 (61 y.o. Male) Treating RN: Carolyne Fiscal, Debi Primary Care Julya Alioto: Esmond Camper Other Clinician: Referring Laurine Kuyper: Mingo Amber, JEFFREY Treating Saylah Ketner/Extender: Tito Dine in Treatment: 6 Active Inactive ` Abuse / Safety / Falls / Self Care Management Nursing Diagnoses: Potential for falls Goals: Patient will remain injury free related to falls Date Initiated: 06/30/2017 Target Resolution Date: 08/20/2017 Goal Status: Active Interventions: Assess fall risk on admission and as needed Notes: ` Orientation to the Wound Care Program Nursing Diagnoses: Knowledge deficit related to the wound healing center program Goals: Patient/caregiver will verbalize understanding of the [facility] Date Initiated: 06/30/2017 Target Resolution Date: 08/20/2017 Goal Status: Active Interventions: Provide education on orientation to the wound center Notes: ` Wound/Skin Impairment Nursing Diagnoses: Impaired tissue integrity Luckett, Jacai L. ([id number]) Goals: Ulcer/skin breakdown will have a volume reduction of 30% by week 4 Date Initiated: 06/30/2017 Target Resolution Date: 08/20/2017 Goal Status: Active Ulcer/skin breakdown will have a volume reduction of 50% by week 8 Date Initiated: 06/30/2017 Target Resolution Date: 08/20/2017 Goal Status: Active Ulcer/skin breakdown will  have a volume reduction of 80% by week 12 Date Initiated: 06/30/2017 Target Resolution Date: 08/20/2017 Goal Status: Active Ulcer/skin breakdown will heal within 14 weeks Date Initiated: 06/30/2017 Target Resolution Date: 08/20/2017 Goal Status: Active Interventions: Assess patient/caregiver ability to obtain necessary supplies Assess patient/caregiver ability to perform ulcer/skin care regimen upon admission and as needed Assess ulceration(s) every visit Notes: Electronic Signature(s) Signed: 08/17/2017 4:37:15 PM By: Alric Quan Entered By: Alric Quan on 08/17/2017 14:55:26 Yoshino, [place] ([id number]) -------------------------------------------------------------------------------- Pain Assessment Details Patient Name: CONLIN, BRAHM. Date of Service: 08/17/2017 2:30 PM Medical Record Number: [id number] Patient Account Number: [id number] Date of Birth/Sex: [birth year] (61 y.o. Male) Treating RN: Carolyne Fiscal, Debi Primary Care Suleika Donavan: Esmond Camper Other Clinician: Referring Della Scrivener: Mingo Amber, JEFFREY Treating Jamesina Gaugh/Extender: Tito Dine in Treatment: 6 Active Problems Location of Pain Severity and Description of Pain Patient Has Paino No Site Locations Pain Management and Medication Current Pain Management: Electronic Signature(s) Signed: 08/17/2017 4:37:15 PM By: Alric Quan Entered By: Alric Quan on 08/17/2017 14:38:32 Pink, Deondrea L. ([id number]) -------------------------------------------------------------------------------- Patient/Caregiver Education Details Patient Name: LUISDANIEL, KENTON L. Date of Service: 08/17/2017 2:30  PM Medical Record Number: [id number] Patient Account Number: [id number] Date of Birth/Gender: [birth year] (61 y.o. Male) Treating RN: Carolyne Fiscal, Debi Primary Care Physician: Esmond Camper Other Clinician: Referring Physician: Esmond Camper Treating Physician/Extender: Tito Dine in Treatment:  6 Education Assessment Education Provided To: Patient Education Topics Provided Wound/Skin Impairment: Handouts: Other: change dressing as ordered Methods: Demonstration, Explain/Verbal Responses: State content correctly Electronic Signature(s) Signed: 08/17/2017 4:37:15 PM By: Alric Quan Entered By: Alric Quan on 08/17/2017 14:56:08 Cerniglia, Zackry L. ([id number]) -------------------------------------------------------------------------------- Wound Assessment Details Patient Name: ULISSES, VONDRAK L. Date of Service: 08/17/2017 2:30 PM Medical Record Number: [id number] Patient Account Number: [id number] Date of Birth/Sex: [birth year] (61 y.o. Male) Treating RN: Carolyne Fiscal, Debi Primary Care Belvie Iribe: Esmond Camper Other Clinician: Referring Ladelle Teodoro: Mingo Amber, JEFFREY Treating Cindel Daugherty/Extender: Ricard Dillon Weeks in Treatment: 6 Wound Status Wound Number: 1 Primary Etiology: Venous Leg Ulcer Wound Location: Right Malleolus - Medial Wound Status: Open Wounding Event: Gradually Appeared Comorbid History: Hypertension Date Acquired: 06/15/2017 Weeks Of Treatment: 6 Clustered Wound: No Photos Photo Uploaded By: Alric Quan on 08/17/2017 16:07:03 Wound Measurements Length: (cm) 0.8 Width: (cm) 1.6 Depth: (cm) 0.1 Area: (cm) 1.005 Volume: (cm) 0.101 % Reduction in Area: 90.7% % Reduction in Volume: 90.7% Epithelialization: Large (67-100%) Tunneling: No Undermining: No Wound Description Full Thickness Without Exposed Classification: Support Structures Wound Margin: Flat and Intact Exudate Large Amount: Exudate Type: Serosanguineous Exudate Color: red, brown Foul Odor After Cleansing: No Slough/Fibrino Yes Wound Bed Granulation Amount: Large (67-100%) Exposed Structure Granulation Quality: Pink Fascia Exposed: No Necrotic Amount: Small (1-33%) Fat Layer (Subcutaneous Tissue) Exposed: No Craddock, Shey L. ([id number]) Necrotic Quality:  Adherent Slough Tendon Exposed: No Muscle Exposed: No Joint Exposed: No Bone Exposed: No Periwound Skin Texture Texture Color No Abnormalities Noted: No No Abnormalities Noted: No Callus: No Atrophie Blanche: No Crepitus: No Cyanosis: No Excoriation: No Ecchymosis: Yes Induration: No Erythema: Yes Rash: No Erythema Location: Circumferential Scarring: No Hemosiderin Staining: No Mottled: No Moisture Pallor: No No Abnormalities Noted: No Rubor: No Dry / Scaly: No Maceration: Yes Temperature / Pain Temperature: No Abnormality Tenderness on Palpation: Yes Wound Preparation Ulcer Cleansing: Other: soap and water, Topical Anesthetic Applied: Other: lidocaine 4%, Treatment Notes Wound #1 (Right, Medial Malleolus) 1. Cleansed with: Clean wound with Normal Saline 2. Anesthetic Topical Lidocaine 4% cream to wound bed prior to debridement 4. Dressing Applied: Hydrogel Promogran 5. Secondary Dressing Applied ABD Pad Kerlix/Conform Non-Adherent pad 6. Footwear/Offloading device applied Ace wrap 7. Secured with Recruitment consultant) Signed: 08/17/2017 4:37:15 PM By: Alric Quan Entered By: Alric Quan on 08/17/2017 14:45:02 Pianka, Betsy [place]ne ([id number]) Elmore, Kypton L. ([id number]) -------------------------------------------------------------------------------- Vitals Details Patient Name: KHIYAN, CRACE L. Date of Service: 08/17/2017 2:30 PM Medical Record Number: [id number] Patient Account Number: [id number] Date of Birth/Sex: November 02, 1956 (61 y.o. Male) Treating RN: Carolyne Fiscal, Debi Primary Care Kourtney Terriquez: Esmond Camper Other Clinician: Referring Edmonia Gonser: Mingo Amber, JEFFREY Treating Christyna Letendre/Extender: Tito Dine in Treatment: 6 Vital Signs Time Taken: 14:39 Temperature (F): 98.0 Height (in): 72 Pulse (bpm): 87 Weight (lbs): 245 Respiratory Rate (breaths/min): 18 Body Mass Index (BMI): 33.2 Blood Pressure (mmHg):  135/85 Reference Range: 80 - 120 mg / dl Electronic Signature(s) Signed: 08/17/2017 4:37:15 PM By: Alric Quan Entered By: Alric Quan on 08/17/2017 14:39:45

## 2017-08-19 NOTE — Progress Notes (Signed)
[name] [name] L. ([id number]) Visit Report for 08/17/2017 HPI Details Patient Name: [name] [name] [name] [name]. Date of Service: 08/17/2017 2:30 PM Medical Record Number: [id number] Patient Account Number: [id number] Date of Birth/Sex: 1956/08/13 (61 y.o. Male) Treating RN: [name] [name], [name] [name] Primary Care Provider: Esmond [name] Other Clinician: Referring Provider: Mingo [name], [name] Treating Provider/Extender: [name] [name] in Treatment: 6 History of Present Illness HPI Description: 06/30/17 on evaluation today patient presents with a recurrent ulcer of the medial right ankle. He has had a region of atrophy and discoloration since he had vein strippin "years ago". Nonetheless she tells me at this point on evaluation today that this area reopened a 2 weeks ago and he initially contacted the [facility] wound center for an appointment but the appointment was so far out that he was unable to wait till that point. He therefore made an appointment here in [place] per the recommendation. He has been tolerating the wound from the standpoint of pain although he tells me it's not too painful. 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 He knows he should be using it however. Fortunately there's no evidence of infection in the wound appears to be doing well. 07/06/17; patient admitted to the clinic last week with a recurrent venous insufficiency ulcer on the medial right ankle. Fairly extensive open wound with surrounding erythematous discolored scan as well as small dilated veins inferiorly in the wound and medially in the foot. He describes tenderness actually lower down in the foot over these veins. His history goes back to vein stripping on the right leg 15 years ago. He was cared for in [place] when are sister clinic by Dr. Con Memos in 2016 at that time with a wound  in the same spot. After he left the clinic he went on to laser [facility] I'll need to check these records. Per our intake nurse the wound looks better today. 07/14/17; patient complains of a lot of pain in the lower heel below the wound. There is discoloration here but certainly no different from last week. I did review the notes from Dr. Kellie Simmering he last saw him in November 2016 post successful laser ablation of the right greater saphenous vein. Was noted he had severe skin changes in that area even at that time 07/21/17; this patient continues to do really well although he is still having moderate drainage. Chronic venous insufficiency wound area with significant venous inflammation. He has had previous ablation by Dr. Kellie Simmering in 2016 of the right greater saphenous vein. Question is on the right medial ankle extending into the medial aspect of the calcaneus. We have been using silver alginate 07/28/17 on evaluation today patient tells me that he has been having some increased discomfort in the right lower extremity wound for the past several days although today it is actually better. He also tells me that he had some fevers although not measured as well as chills but again that is better today really it was only last night that he experienced those symptoms. Unfortunately she feels like there is a concern that he could have an infection in regard to the wound although he is not really certain about this. He fortunately has no bowel or bladder dysfunction no unexpected nausea vomiting or diarrhea. His pain today is much better around the a 2 out of  10 at rest however with cleansing of the wound this gets more severe rated to be a 5-6 out of 10 08/04/17 on evaluation today patient's right medial ankle ulcer appears to be doing much better. He has noted a dramatic improvement in the overall discomfort which I think is an excellent step and leap from where he was last  week with 5-6 out of 10 pain. Fortunately there appears to be no evidence of infection. [name], [name] L. ([id number]) No fevers, chills, nausea, or vomiting noted at this time. 08/11/17; the patient really is doing quite nicely. Superficial wound now with nice granulation tissue. Much smaller. He is using his own external ace wrap and Hydrofera Blue 08/18/17; the wound is still having trouble with sticking to dressings including this week his collagen and hydrogel that he is apparently changing daily. Electronic Signature(s) Signed: 08/18/2017 7:49:24 AM By: Linton Ham MD Entered By: Linton Ham on 08/17/2017 16:31:27 [name] [name] ([id number]) -------------------------------------------------------------------------------- Physical Exam Details Patient Name: [name], [name] L. Date of Service: 08/17/2017 2:30 PM Medical Record Number: [id number] Patient Account Number: [id number] Date of Birth/Sex: Mar 17, 1956 (61 y.o. Male) Treating RN: [name] [name], [name] [name] Primary Care Provider: Esmond [name] Other Clinician: Referring Provider: Mingo [name], [name] Treating Provider/Extender: Ricard Dillon Weeks in Treatment: 6 Constitutional Sitting or standing Blood Pressure is within target range for patient.. Pulse regular and within target range for patient.Marland Kitchen Respirations regular, non-labored and within target range.. Temperature is normal and within the target range for the patient.Marland Kitchen appears in no distress. Eyes Conjunctivae clear. No discharge. Respiratory Respiratory effort is easy and symmetric bilaterally. Rate is normal at rest and on room air.. Cardiovascular Pedal pulses palpable and strong bilaterally.. Edema is well-controlled. Lymphatic Nonpalpable and the right popliteal or inguinal area. Integumentary (Hair, Skin) No rashes seen. Psychiatric No evidence of depression, anxiety, or agitation. Calm, cooperative, and communicative. Appropriate interactions and  affect.. Electronic Signature(s) Signed: 08/18/2017 7:49:24 AM By: Linton Ham MD Entered By: Linton Ham on 08/17/2017 16:32:38 [name] [name] L. ([id number]) -------------------------------------------------------------------------------- Physician Orders Details Patient Name: [name] [name] [name] L. Date of Service: 08/17/2017 2:30 PM Medical Record Number: [id number] Patient Account Number: [id number] Date of Birth/Sex: [birth year] (61 y.o. Male) Treating RN: [name] [name], [name] [name] Primary Care Provider: Esmond [name] Other Clinician: Referring Provider: Mingo [name], [name] Treating Provider/Extender: [name] [name] in Treatment: 6 Verbal / Phone Orders: No Diagnosis Coding Wound Cleansing Wound #1 Right,Medial Malleolus o Clean wound with Normal Saline. o Cleanse wound with mild soap and water o May Shower, gently pat wound dry prior to applying new dressing. Anesthetic Wound #1 Right,Medial Malleolus o Topical Lidocaine 4% cream applied to wound bed prior to debridement Skin Barriers/Peri-Wound Care Wound #1 Right,Medial Malleolus o Triamcinolone Acetonide Ointment Primary Wound Dressing Wound #1 Right,Medial Malleolus o Hydrogel o Promogran - moisten with hydrogel Secondary Dressing Wound #1 Right,Medial Malleolus o ABD pad o Conform/Kerlix o Non-adherent pad Dressing Change Frequency Wound #1 Right,Medial Malleolus o Change dressing every other day. Follow-up Appointments Wound #1 Right,Medial Malleolus o Return Appointment in 1 week. o Nurse Visit as needed Edema Control Greaves, Kemarion L. ([id number]) Wound #1 Right,Medial Malleolus o Elevate legs to the level of the heart and pump ankles as often as possible o Other: - ace bandage Additional Orders / Instructions Wound #1 Right,Medial Malleolus o Increase protein intake. o Other: - Please add vitamin A, vitamin C and zinc supplements to your diet Electronic  Signature(s) Signed: 08/17/2017 4:37:15 PM By: Alric Quan Signed: 08/18/2017 7:49:24 AM By: Linton Ham  MD Entered By: Alric Quan on 08/17/2017 15:07:03 [name] [name] [name] L. ([id number]) -------------------------------------------------------------------------------- Problem List Details Patient Name: SEIF, TEICHERT L. Date of Service: 08/17/2017 2:30 PM Medical Record Number: [id number] Patient Account Number: [id number] Date of Birth/Sex: 1956/10/26 (61 y.o. Male) Treating RN: [name] [name], [name] [name] Primary Care Provider: Esmond [name] Other Clinician: Referring Provider: Mingo [name], [name] Treating Provider/Extender: [name] [name] in Treatment: 6 Active Problems ICD-10 Encounter Code Description Active Date Diagnosis I87.2 Venous insufficiency (chronic) (peripheral) 06/30/2017 Yes L97.312 Non-pressure chronic ulcer of right ankle with fat layer 06/30/2017 Yes exposed [place] (primary) hypertension 06/30/2017 Yes Inactive Problems Resolved Problems Electronic Signature(s) Signed: 08/18/2017 7:49:24 AM By: Linton Ham MD Entered By: Linton Ham on 08/17/2017 16:30:48 [name] [name] [name] [name] ([id number]) -------------------------------------------------------------------------------- Progress Note Details Patient Name: [name] [name] [name] L. Date of Service: 08/17/2017 2:30 PM Medical Record Number: [id number] Patient Account Number: [id number] Date of Birth/Sex: [birth year] (61 y.o. Male) Treating RN: [name] [name], [name] [name] Primary Care Provider: Esmond [name] Other Clinician: Referring Provider: Mingo [name], [name] Treating Provider/Extender: [name] [name] in Treatment: 6 Subjective History of Present Illness (HPI) 06/30/17 on evaluation today patient presents with a recurrent ulcer of the medial right ankle. He has had a region of atrophy and discoloration since he had vein strippin "years ago". Nonetheless she tells me at this point on evaluation today that  this area reopened a 2 weeks ago and he initially contacted the [facility] wound center for an appointment but the appointment was so far out that he was unable to wait till that point. He therefore made an appointment here in [place] per the recommendation. He has been tolerating the wound from the standpoint of pain although he tells me it's not too painful. 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 He knows he should be using it however. Fortunately there's no evidence of infection in the wound appears to be doing well. 07/06/17; patient admitted to the clinic last week with a recurrent venous insufficiency ulcer on the medial right ankle. Fairly extensive open wound with surrounding erythematous discolored scan as well as small dilated veins inferiorly in the wound and medially in the foot. He describes tenderness actually lower down in the foot over these veins. His history goes back to vein stripping on the right leg 15 years ago. He was cared for in [name] when are sister clinic by Dr. Con Memos in 2016 at that time with a wound in the same spot. After he left the clinic he went on to laser ablation [facility] I'll need to check these records. Per our intake nurse the wound looks better today. 07/14/17; patient complains of a lot of pain in the lower heel below the wound. There is discoloration here but certainly no different from last week. I did review the notes from Dr. Kellie Simmering he last saw him in November 2016 post successful laser ablation of the right greater saphenous vein. Was noted he had severe skin changes in that area even at that time 07/21/17; this patient continues to do really well although he is still having moderate drainage. Chronic venous insufficiency wound area with significant venous inflammation. He has had  previous ablation by Dr. Kellie Simmering in 2016 of the right greater saphenous vein. Question is on the right medial ankle extending into the medial aspect of the calcaneus. We have been  using silver alginate 07/28/17 on evaluation today patient tells me that he has been having some increased discomfort in the right lower extremity wound for the past several days although today it is actually better. He also tells me that he had some fevers although not measured as well as chills but again that is better today really it was only last night that he experienced those symptoms. Unfortunately she feels like there is a concern that he could have an infection in regard to the wound although he is not really certain about this. He fortunately has no bowel or bladder dysfunction no unexpected nausea vomiting or diarrhea. His pain today is much better around the a 2 out of 10 at rest however with cleansing of the wound this gets more severe rated to be a 5-6 out of 10 08/04/17 on evaluation today patient's right medial ankle ulcer appears to be doing much better. He has noted a dramatic improvement in the overall discomfort which I think is an excellent step and leap from where he was last week with 5-6 out of 10 pain. Fortunately there appears to be no evidence of infection. No fevers, chills, nausea, or vomiting noted at this time. 08/11/17; the patient really is doing quite nicely. Superficial wound now with nice granulation tissue. Much [name] [name] [name] L. ([id number]) smaller. He is using his own external ace wrap and Hydrofera Blue 08/18/17; the wound is still having trouble with sticking to dressings including this week his collagen and hydrogel that he is apparently changing daily. Objective Constitutional Sitting or standing Blood Pressure is within target range for patient.. Pulse regular and within target range for patient.Marland Kitchen Respirations regular, non-labored and within target range.. Temperature is normal  and within the target range for the patient.Marland Kitchen appears in no distress. Vitals Time Taken: 2:39 PM, Height: 72 in, Weight: 245 lbs, BMI: 33.2, Temperature: 98.0 F, Pulse: 87 bpm, Respiratory Rate: 18 breaths/min, Blood Pressure: 135/85 mmHg. Eyes Conjunctivae clear. No discharge. Respiratory Respiratory effort is easy and symmetric bilaterally. Rate is normal at rest and on room air.. Cardiovascular Pedal pulses palpable and strong bilaterally.. Edema is well-controlled. Lymphatic Nonpalpable and the right popliteal or inguinal area. Psychiatric No evidence of depression, anxiety, or agitation. Calm, cooperative, and communicative. Appropriate interactions and affect.. Integumentary (Hair, Skin) No rashes seen. Wound #1 status is Open. Original cause of wound was Gradually Appeared. The wound is located on the Right,Medial Malleolus. The wound measures 0.8cm length x 1.6cm width x 0.1cm depth; 1.005cm^2 area and 0.101cm^3 volume. There is no tunneling or undermining noted. There is a large amount of serosanguineous drainage noted. The wound margin is flat and intact. There is large (67-100%) pink granulation within the wound bed. There is a small (1-33%) amount of necrotic tissue within the wound bed including Adherent Slough. The periwound skin appearance exhibited: Maceration, Ecchymosis, Erythema. [name] [name] [name] L. ([id number]) The periwound skin appearance did not exhibit: Callus, Crepitus, Excoriation, Induration, Rash, Scarring, Dry/Scaly, Atrophie Blanche, Cyanosis, Hemosiderin Staining, Mottled, Pallor, Rubor. The surrounding wound skin color is noted with erythema which is circumferential. Periwound temperature was noted as No Abnormality. The periwound has tenderness on palpation. Assessment Active Problems ICD-10 I87.2 - Venous insufficiency (chronic) (peripheral) L97.312 - Non-pressure chronic ulcer of right ankle with fat layer exposed I10 - Essential (primary)  hypertension Plan Wound Cleansing: Wound #1 Right,Medial Malleolus: Clean wound with Normal Saline. Cleanse wound with mild soap and water May Shower, gently pat wound dry prior to applying new dressing. Anesthetic:  Wound #1 Right,Medial Malleolus: Topical Lidocaine 4% cream applied to wound bed prior to debridement Skin Barriers/Peri-Wound Care: Wound #1 Right,Medial Malleolus: Triamcinolone Acetonide Ointment Primary Wound Dressing: Wound #1 Right,Medial Malleolus: Hydrogel Promogran - moisten with hydrogel Secondary Dressing: Wound #1 Right,Medial Malleolus: ABD pad Conform/Kerlix Non-adherent pad Dressing Change Frequency: Wound #1 Right,Medial Malleolus: Change dressing every other day. Follow-up Appointments: Wound #1 Right,Medial Malleolus: Return Appointment in 1 week. [name] [name] [name] L. ([id number]) Nurse Visit as needed Edema Control: Wound #1 Right,Medial Malleolus: Elevate legs to the level of the heart and pump ankles as often as possible Other: - ace bandage Additional Orders / Instructions: Wound #1 Right,Medial Malleolus: Increase protein intake. Other: - Please add vitamin A, vitamin C and zinc supplements to your diet #1 replace Telfa over the wound and collagen hydrogel over that. This should not stick. If this is not visibly smaller next week then [facility] Electronic Signature(s) Signed: 08/18/2017 7:49:24 AM By: Linton Ham MD Entered By: Linton Ham on 08/17/2017 16:33:41 [name] [name] [name] [name] ([id number]) -------------------------------------------------------------------------------- SuperBill Details Patient Name: [name] [name] [name] L. Date of Service: 08/17/2017 Medical Record Number: [id number] Patient Account Number: [id number] Date of Birth/Sex: [birth year] (61 y.o. Male) Treating RN: [name] [name], [name] [name] Primary Care Provider: Esmond [name] Other Clinician: Referring Provider: Mingo [name], [name] Treating Provider/Extender: [name] [name] in Treatment: 6 Diagnosis Coding ICD-10 Codes Code Description I87.2 Venous insufficiency (chronic) (peripheral) L97.312 Non-pressure chronic ulcer of right ankle with fat layer exposed [place] (primary) hypertension Facility Procedures CPT4 Code: [id number] Description: 99213 - WOUND CARE VISIT-LEV 3 EST PT Modifier: Quantity: 1 Physician Procedures CPT4 Code Description: [id number] - WC PHYS LEVEL 3 - EST PT ICD-10 Description Diagnosis L97.312 Non-pressure chronic ulcer of right ankle with fat Modifier: layer expose Quantity: 1 d Electronic Signature(s) Signed: 08/17/2017 4:37:15 PM By: Alric Quan Signed: 08/18/2017 7:49:24 AM By: Linton Ham MD Entered By: Alric Quan on 08/17/2017 16:36:12

## 2017-08-24 ENCOUNTER — Encounter: Payer: 59 | Attending: Internal Medicine | Admitting: Internal Medicine

## 2017-08-24 DIAGNOSIS — L97312 Non-pressure chronic ulcer of right ankle with fat layer exposed: Secondary | ICD-10-CM | POA: Diagnosis not present

## 2017-08-24 DIAGNOSIS — L97311 Non-pressure chronic ulcer of right ankle limited to breakdown of skin: Secondary | ICD-10-CM | POA: Diagnosis not present

## 2017-08-24 DIAGNOSIS — I10 Essential (primary) hypertension: Secondary | ICD-10-CM | POA: Diagnosis not present

## 2017-08-24 DIAGNOSIS — L03116 Cellulitis of left lower limb: Secondary | ICD-10-CM | POA: Diagnosis not present

## 2017-08-24 DIAGNOSIS — I872 Venous insufficiency (chronic) (peripheral): Secondary | ICD-10-CM | POA: Diagnosis not present

## 2017-08-24 DIAGNOSIS — M7042 Prepatellar bursitis, left knee: Secondary | ICD-10-CM | POA: Diagnosis not present

## 2017-08-24 MED FILL — CEPHALEXIN 500 MG CAPSULE: 500 | 7 days supply | Qty: 28 | Fill #0

## 2017-08-25 NOTE — Progress Notes (Signed)
[name], [name] ([id number]) Visit Report for 08/24/2017 Arrival Information Details Patient Name: [name] [name], [name] [name]. Date of Service: 08/24/2017 8:00 AM Medical Record Number: [id number] Patient Account Number: [id number] Date of Birth/Sex: 30-Dec-1955 (61 y.o. Male) Treating RN: [name] [name] Primary Care [name] [name]: [name] [name] Other Clinician: Referring [name] [name]: [name] [name] Treating Larraine [name]/Extender: [name] [name] in Treatment: 7 Visit Information History Since Last Visit Added or deleted any medications: No Patient Arrived: Ambulatory Any new allergies or adverse reactions: No Arrival Time: 08:04 Had a fall or experienced change in No Accompanied By: self activities of daily living that may affect Transfer Assistance: None risk of falls: Patient Identification Verified: Yes Signs or symptoms of abuse/neglect since last No Secondary Verification Process Yes visito Completed: Hospitalized since last visit: No Patient Requires Transmission-Based No Has Dressing in Place as Prescribed: Yes Precautions: Has Compression in Place as Prescribed: Yes Patient Has Alerts: No Pain Present Now: No Electronic Signature(s) Signed: 08/24/2017 5:20:16 PM By: [name] [name], BSN, RN, CWS, Kim RN, BSN Entered By: [name] [name] on 08/24/2017 08:04:18 Coachman, [name] ([id number]) -------------------------------------------------------------------------------- Clinic Level of Care Assessment Details Patient Name: [name] [name]. Date of Service: 08/24/2017 8:00 AM Medical Record Number: [id number] Patient Account Number: [id number] Date of Birth/Sex: [birth year] (61 y.o. Male) Treating RN: [name] [name] Primary Care Olliver Boyadjian: [name] [name] Other Clinician: Referring Ashlie Mcmenamy: [name] [name] Treating Averleigh Savary/Extender: [name] [name] in Treatment: 7 Clinic Level of Care Assessment Items TOOL 4 Quantity Score []  - Use when only an EandM is performed on FOLLOW-UP  visit 0 ASSESSMENTS - Nursing Assessment / Reassessment []  - Reassessment of Co-morbidities (includes updates in patient status) 0 X - Reassessment of Adherence to Treatment Plan 1 5 ASSESSMENTS - Wound and Skin Assessment / Reassessment X - Simple Wound Assessment / Reassessment - one wound 1 5 []  - Complex Wound Assessment / Reassessment - multiple wounds 0 []  - Dermatologic / Skin Assessment (not related to wound area) 0 ASSESSMENTS - Focused Assessment []  - Circumferential Edema Measurements - multi extremities 0 []  - Nutritional Assessment / Counseling / Intervention 0 []  - Lower Extremity Assessment (monofilament, tuning fork, pulses) 0 []  - Peripheral Arterial Disease Assessment (using hand held doppler) 0 ASSESSMENTS - Ostomy and/or Continence Assessment and Care []  - Incontinence Assessment and Management 0 []  - Ostomy Care Assessment and Management (repouching, etc.) 0 PROCESS - Coordination of Care X - Simple Patient / Family Education for ongoing care 1 15 []  - Complex (extensive) Patient / Family Education for ongoing care 0 []  - Staff obtains Programmer, systems, Records, Test Results / Process Orders 0 []  - Staff telephones HHA, Nursing Homes / Clarify orders / etc 0 []  - Routine Transfer to another Facility (non-emergent condition) 0 [name], [name] ([id number]) []  - Routine Hospital Admission (non-emergent condition) 0 []  - New Admissions / Biomedical engineer / Ordering NPWT, Apligraf, etc. 0 []  - Emergency Hospital Admission (emergent condition) 0 X - Simple Discharge Coordination 1 10 []  - Complex (extensive) Discharge Coordination 0 PROCESS - Special Needs []  - Pediatric / Minor Patient Management 0 []  - Isolation Patient Management 0 []  - Hearing / Language / Visual special needs 0 []  - Assessment of Community assistance (transportation, D/C planning, etc.) 0 []  - Additional assistance / Altered mentation 0 []  - Support Surface(s) Assessment (bed, cushion, seat,  etc.) 0 INTERVENTIONS - Wound Cleansing / Measurement X - Simple Wound Cleansing - one wound 1 5 []  - Complex Wound Cleansing - multiple wounds  0 X - Wound Imaging (photographs - any number of wounds) 1 5 []  - Wound Tracing (instead of photographs) 0 X - Simple Wound Measurement - one wound 1 5 []  - Complex Wound Measurement - multiple wounds 0 INTERVENTIONS - Wound Dressings []  - Small Wound Dressing one or multiple wounds 0 X - Medium Wound Dressing one or multiple wounds 1 15 []  - Large Wound Dressing one or multiple wounds 0 []  - Application of Medications - topical 0 []  - Application of Medications - injection 0 INTERVENTIONS - Miscellaneous []  - External ear exam 0 [name], [name] ([id number]) []  - Specimen Collection (cultures, biopsies, blood, body fluids, etc.) 0 []  - Specimen(s) / Culture(s) sent or taken to Lab for analysis 0 []  - Patient Transfer (multiple staff / Harrel Lemon Lift / Similar devices) 0 []  - Simple Staple / Suture removal (25 or less) 0 []  - Complex Staple / Suture removal (26 or more) 0 []  - Hypo / Hyperglycemic Management (close monitor of Blood Glucose) 0 []  - Ankle / Brachial Index (ABI) - do not check if billed separately 0 X - Vital Signs 1 5 Has the patient been seen at the hospital within the last three years: Yes Total Score: 70 Level Of Care: New/Established - Level 2 Electronic Signature(s) Signed: 08/24/2017 5:20:16 PM By: [name] [name], BSN, RN, CWS, Kim RN, BSN Entered By: [name] [name] on 08/24/2017 08:21:37 [name], [name] ([id number]) -------------------------------------------------------------------------------- Encounter Discharge Information Details Patient Name: [name] [name]. Date of Service: 08/24/2017 8:00 AM Medical Record Number: [id number] Patient Account Number: [id number] Date of Birth/Sex: 01/21/1956 (61 y.o. Male) Treating RN: [name] [name] Primary Care Nayara Taplin: [name] [name] Other Clinician: Referring [name] [name]:  [name] [name] Treating Jenisis Harmsen/Extender: [name] [name] in Treatment: 7 Encounter Discharge Information Items Discharge Pain Level: 0 Discharge Condition: Stable Ambulatory Status: Ambulatory Discharge Destination: Home Transportation: Private Auto Accompanied By: self Schedule Follow-up Appointment: Yes Medication Reconciliation completed Yes and provided to Patient/Care Lashandra Arauz: Patient Clinical Summary of Care: Declined Electronic Signature(s) Signed: 08/24/2017 5:20:16 PM By: [name] [name], BSN, RN, CWS, Kim RN, BSN Entered By: [name] [name] on 08/24/2017 09:03:29 [name], [name] ([id number]) -------------------------------------------------------------------------------- Lower Extremity Assessment Details Patient Name: MERYL, [name] [name]. Date of Service: 08/24/2017 8:00 AM Medical Record Number: [id number] Patient Account Number: [id number] Date of Birth/Sex: 11-19-1956 (61 y.o. Male) Treating RN: [name] [name] Primary Care Resha Filippone: [name] [name] Other Clinician: Referring Omri Bertran: [name] [name] Treating Ireoluwa Grant/Extender: [name] [name] in Treatment: 7 Vascular Assessment Pulses: Dorsalis Pedis Palpable: [Right:Yes] Posterior Tibial Extremity colors, hair growth, and conditions: Extremity Color: [Right:Normal] Hair Growth on Extremity: [Right:Yes] Temperature of Extremity: [Right:Warm] Capillary Refill: [Right:< 3 seconds] Dependent Rubor: [Right:No] Blanched when Elevated: [Right:No] Lipodermatosclerosis: [Right:No] Toe Nail Assessment Left: Right: Thick: No Discolored: No Deformed: No Improper Length and Hygiene: No Electronic Signature(s) Signed: 08/24/2017 5:20:16 PM By: [name] [name], BSN, RN, CWS, Kim RN, BSN Entered By: [name] [name] on 08/24/2017 08:16:55 [name], [name] ([id number]) -------------------------------------------------------------------------------- Multi Wound Chart Details Patient Name: NHAN, QUALLEY  L. Date of Service: 08/24/2017 8:00 AM Medical Record Number: [id number] Patient Account Number: [id number] Date of Birth/Sex: March 03, 1956 (61 y.o. Male) Treating RN: [name] [name] Primary Care Katiria Calame: [name] [name] Other Clinician: Referring Safiya Girdler: [name] [name] Treating Mahari Vankirk/Extender: Ricard Dillon Weeks in Treatment: 7 Vital Signs Height(in): 72 Pulse(bpm): 72 Weight(lbs): 245 Blood Pressure 158/86 (mmHg): Body Mass Index(BMI): 33 Temperature(F): 97.9 Respiratory Rate 18 (breaths/min): Photos: [1:No Photos] [N/A:N/A] Wound Location: [1:Right Malleolus -  Medial] [N/A:N/A] Wounding Event: [1:Gradually Appeared] [N/A:N/A] Primary Etiology: [1:Venous Leg Ulcer] [N/A:N/A] Comorbid History: [1:Hypertension] [N/A:N/A] Date Acquired: [1:06/15/2017] [N/A:N/A] Weeks of Treatment: [1:7] [N/A:N/A] Wound Status: [1:Open] [N/A:N/A] Measurements L x W x D 0.7x1x0.1 [N/A:N/A] (cm) Area (cm) : [1:0.55] [N/A:N/A] Volume (cm) : [1:0.055] [N/A:N/A] % Reduction in Area: [1:94.90%] [N/A:N/A] % Reduction in Volume: 94.90% [N/A:N/A] Classification: [1:Full Thickness Without Exposed Support Structures] [N/A:N/A] Exudate Amount: [1:Medium] [N/A:N/A] Exudate Type: [1:Serosanguineous] [N/A:N/A] Exudate Color: [1:red, brown] [N/A:N/A] Wound Margin: [1:Flat and Intact] [N/A:N/A] Granulation Amount: [1:Large (67-100%)] [N/A:N/A] Granulation Quality: [1:Red] [N/A:N/A] Necrotic Amount: [1:Small (1-33%)] [N/A:N/A] Exposed Structures: [1:Fascia: No Fat Layer (Subcutaneous Tissue) Exposed: No Tendon: No Muscle: No Joint: No] [N/A:N/A] Bone: No Limited to Skin Breakdown Epithelialization: Large (67-100%) N/A N/A Periwound Skin Texture: Induration: Yes N/A N/A Excoriation: No Callus: No Crepitus: No Rash: No Scarring: No Periwound Skin Maceration: No N/A N/A Moisture: Dry/Scaly: No Periwound Skin Color: Hemosiderin Staining: Yes N/A N/A Atrophie Blanche: No Cyanosis:  No Ecchymosis: No Erythema: No Mottled: No Pallor: No Rubor: No Temperature: No Abnormality N/A N/A Tenderness on Yes N/A N/A Palpation: Wound Preparation: Ulcer Cleansing: N/A N/A Rinsed/Irrigated with Saline Topical Anesthetic Applied: Other: lidocaine 4% Treatment Notes Electronic Signature(s) Signed: 08/25/2017 4:30:21 AM By: Linton Ham MD Entered By: Linton Ham on 08/24/2017 08:18:22 [name], [name] ([id number]) -------------------------------------------------------------------------------- [place] Details Patient Name: SOLMON, BOHR. Date of Service: 08/24/2017 8:00 AM Medical Record Number: [id number] Patient Account Number: [id number] Date of Birth/Sex: 04/24/1956 (61 y.o. Male) Treating RN: [name] [name] Primary Care Gerasimos Plotts: [name] [name] Other Clinician: Referring Spencer Cardinal: [name] [name] Treating Lunabella Badgett/Extender: [name] [name] in Treatment: 7 Active Inactive ` Abuse / Safety / Falls / Self Care Management Nursing Diagnoses: Potential for falls Goals: Patient will remain injury free related to falls Date Initiated: 06/30/2017 Target Resolution Date: 08/20/2017 Goal Status: Active Interventions: Assess fall risk on admission and as needed Notes: ` Orientation to the Wound Care Program Nursing Diagnoses: Knowledge deficit related to the wound healing center program Goals: Patient/caregiver will verbalize understanding of the [facility] Date Initiated: 06/30/2017 Target Resolution Date: 08/20/2017 Goal Status: Active Interventions: Provide education on orientation to the wound center Notes: ` Wound/Skin Impairment Nursing Diagnoses: Impaired tissue integrity [name], [name] ([id number]) Goals: Ulcer/skin breakdown will have a volume reduction of 30% by week 4 Date Initiated: 06/30/2017 Target Resolution Date: 08/20/2017 Goal Status: Active Ulcer/skin breakdown will have a volume  reduction of 50% by week 8 Date Initiated: 06/30/2017 Target Resolution Date: 08/20/2017 Goal Status: Active Ulcer/skin breakdown will have a volume reduction of 80% by week 12 Date Initiated: 06/30/2017 Target Resolution Date: 08/20/2017 Goal Status: Active Ulcer/skin breakdown will heal within 14 weeks Date Initiated: 06/30/2017 Target Resolution Date: 08/20/2017 Goal Status: Active Interventions: Assess patient/caregiver ability to obtain necessary supplies Assess patient/caregiver ability to perform ulcer/skin care regimen upon admission and as needed Assess ulceration(s) every visit Notes: Electronic Signature(s) Signed: 08/24/2017 5:20:16 PM By: [name] [name], BSN, RN, CWS, Kim RN, BSN Entered By: [name] [name] on 08/24/2017 08:16:59 [name], [name] ([id number]) -------------------------------------------------------------------------------- Pain Assessment Details Patient Name: JAVONN, GAUGER. Date of Service: 08/24/2017 8:00 AM Medical Record Number: [id number] Patient Account Number: [id number] Date of Birth/Sex: [birth year] (61 y.o. Male) Treating RN: [name] [name] Primary Care Hattie Aguinaldo: [name] [name] Other Clinician: Referring Preciliano Castell: [name] [name] Treating Calel Pisarski/Extender: Ricard Dillon Weeks in Treatment: 7 Active Problems Location of Pain Severity and Description of Pain Patient Has Paino No Site Locations  With Dressing Change: No Pain Management and Medication Current Pain Management: Electronic Signature(s) Signed: 08/24/2017 5:20:16 PM By: [name] [name], BSN, RN, CWS, Kim RN, BSN Entered By: [name] [name] on 08/24/2017 08:04:34 Slimp, Kaid L. ([id number]) -------------------------------------------------------------------------------- Patient/Caregiver Education Details Patient Name: GEOFFERY, AULTMAN L. Date of Service: 08/24/2017 8:00 AM Medical Record Number: [id number] Patient Account Number: [id number] Date of Birth/Gender: Feb 16, 1956 (61 y.o.  Male) Treating RN: [name] [name] Primary Care Physician: [name] [name] Other Clinician: Referring Physician: Esmond [name] Treating Physician/Extender: [name] [name] in Treatment: 7 Education Assessment Education Provided To: Patient Education Topics Provided Wound/Skin Impairment: Handouts: Caring for Your Ulcer, Other: wound care as prrscribed Methods: Demonstration, Explain/Verbal Responses: State content correctly Electronic Signature(s) Signed: 08/24/2017 5:20:16 PM By: [name] [name], BSN, RN, CWS, Kim RN, BSN Entered By: [name] [name] on 08/24/2017 09:03:53 [name], [name] ([id number]) -------------------------------------------------------------------------------- Wound Assessment Details Patient Name: DAANISH, COPES L. Date of Service: 08/24/2017 8:00 AM Medical Record Number: [id number] Patient Account Number: [id number] Date of Birth/Sex: 10-13-1956 (61 y.o. Male) Treating RN: [name] [name] Primary Care Brendyn Mclaren: [name] [name] Other Clinician: Referring Katelyne Galster: [name] [name] Treating Dandy Lazaro/Extender: Ricard Dillon Weeks in Treatment: 7 Wound Status Wound Number: 1 Primary Etiology: Venous Leg Ulcer Wound Location: Right Malleolus - Medial Wound Status: Open Wounding Event: Gradually Appeared Comorbid History: Hypertension Date Acquired: 06/15/2017 Weeks Of Treatment: 7 Clustered Wound: No Photos Photo Uploaded By: [name] [name] on 08/24/2017 09:49:23 Wound Measurements Length: (cm) 0.7 Width: (cm) 1 Depth: (cm) 0.1 Area: (cm) 0.55 Volume: (cm) 0.055 % Reduction in Area: 94.9% % Reduction in Volume: 94.9% Epithelialization: Large (67-100%) Tunneling: No Undermining: No Wound Description Full Thickness Without Exposed Classification: Support Structures Wound Margin: Flat and Intact Exudate Medium Amount: Exudate Type: Serosanguineous Exudate Color: red, brown Foul Odor After Cleansing: No Slough/Fibrino  Yes Wound Bed Granulation Amount: Large (67-100%) Exposed Structure Granulation Quality: Red Fascia Exposed: No Necrotic Amount: Small (1-33%) Fat Layer (Subcutaneous Tissue) Exposed: No [name], [name] ([id number]) Necrotic Quality: Adherent Slough Tendon Exposed: No Muscle Exposed: No Joint Exposed: No Bone Exposed: No Limited to Skin Breakdown Periwound Skin Texture Texture Color No Abnormalities Noted: No No Abnormalities Noted: No Callus: No Atrophie Blanche: No Crepitus: No Cyanosis: No Excoriation: No Ecchymosis: No Induration: Yes Erythema: No Rash: No Hemosiderin Staining: Yes Scarring: No Mottled: No Pallor: No Moisture Rubor: No No Abnormalities Noted: No Dry / Scaly: No Temperature / Pain Maceration: No Temperature: No Abnormality Tenderness on Palpation: Yes Wound Preparation Ulcer Cleansing: Rinsed/Irrigated with Saline Topical Anesthetic Applied: Other: lidocaine 4%, Treatment Notes Wound #1 (Right, Medial Malleolus) 1. Cleansed with: Clean wound with Normal Saline 2. Anesthetic Topical Lidocaine 4% cream to wound bed prior to debridement 4. Dressing Applied: Prisma Ag 5. Secondary Dressing Applied Non-Adherent pad Notes kerlix and ace wrap Electronic Signature(s) Signed: 08/24/2017 5:20:16 PM By: [name] [name], BSN, RN, CWS, Kim RN, BSN Entered By: [name] [name] on 08/24/2017 08:09:59 Oley, [name] ([id number]) -------------------------------------------------------------------------------- Vitals Details Patient Name: KEWON, STATLER L. Date of Service: 08/24/2017 8:00 AM Medical Record Number: [id number] Patient Account Number: [id number] Date of Birth/Sex: 1956-07-26 (61 y.o. Male) Treating RN: [name] [name] Primary Care Kamry Faraci: [name] [name] Other Clinician: Referring Tyrann Donaho: [name] [name] Treating Damier Disano/Extender: [name] [name] in Treatment: 7 Vital Signs Time Taken: 08:04 Temperature (F): 97.9 Height  (in): 72 Pulse (bpm): 72 Weight (lbs): 245 Respiratory Rate (breaths/min): 18 Body Mass Index (BMI): 33.2 Blood Pressure (mmHg): 158/86 Reference Range:  80 - 120 mg / dl Electronic Signature(s) Signed: 08/24/2017 5:20:16 PM By: [name] [name], BSN, RN, CWS, Kim RN, BSN Entered By: [name] [name] on 08/24/2017 80:16:55

## 2017-08-25 NOTE — Progress Notes (Signed)
[name], [name] ([id number]) Visit Report for 08/24/2017 HPI Details Patient Name: [name] [name], [name] [name]. Date of Service: 08/24/2017 8:00 AM Medical Record Number: [id number] Patient Account Number: [id number] Date of Birth/Sex: Jul 26, 1956 (61 y.o. Male) Treating RN: Ahmed Prima Primary Care Provider: Esmond [name] Other Clinician: Referring Provider: Mingo [name], [name] Treating Provider/Extender: [name] [name] in Treatment: 7 History of Present Illness HPI Description: 06/30/17 on evaluation today patient presents with a recurrent ulcer of the medial right ankle. He has had a region of atrophy and discoloration since he had vein strippin "years ago". Nonetheless she tells me at this point on evaluation today that this area reopened a 2 weeks ago and he initially contacted the [facility] wound center for an appointment but the appointment was so far out that he was unable to wait till that point. He therefore made an appointment here in [place] per the recommendation. He has been tolerating the wound from the standpoint of pain although he tells me it's not too painful. 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 He knows he should be using it however. Fortunately there's no evidence of infection in the wound appears to be doing well. 07/06/17; patient admitted to the clinic last week with a recurrent venous insufficiency ulcer on the medial right ankle. Fairly extensive open wound with surrounding erythematous discolored scan as well as small dilated veins inferiorly in the wound and medially in the foot. He describes tenderness actually lower down in the foot over these veins. His history goes back to vein stripping on the right leg 15 years ago. He was cared for in [place] when are sister clinic by Dr. Con Memos in 2016 at that time with a wound  in the same spot. After he left the clinic he went on to laser [facility] I'll need to check these records. Per our intake nurse the wound looks better today. 07/14/17; patient complains of a lot of pain in the lower heel below the wound. There is discoloration here but certainly no different from last week. I did review the notes from Dr. Kellie Simmering he last saw him in November 2016 post successful laser ablation of the right greater saphenous vein. Was noted he had severe skin changes in that area even at that time 07/21/17; this patient continues to do really well although he is still having moderate drainage. Chronic venous insufficiency wound area with significant venous inflammation. He has had previous ablation by Dr. Kellie Simmering in 2016 of the right greater saphenous vein. Question is on the right medial ankle extending into the medial aspect of the calcaneus. We have been using silver alginate 07/28/17 on evaluation today patient tells me that he has been having some increased discomfort in the right lower extremity wound for the past several days although today it is actually better. He also tells me that he had some fevers although not measured as well as chills but again that is better today really it was only last night that he experienced those symptoms. Unfortunately she feels like there is a concern that he could have an infection in regard to the wound although he is not really certain about this. He fortunately has no bowel or bladder dysfunction no unexpected nausea vomiting or diarrhea. His pain today is much better around the a 2 out of  10 at rest however with cleansing of the wound this gets more severe rated to be a 5-6 out of 10 08/04/17 on evaluation today patient's right medial ankle ulcer appears to be doing much better. He has noted a dramatic improvement in the overall discomfort which I think is an excellent step and leap from where he was last  week with 5-6 out of 10 pain. Fortunately there appears to be no evidence of infection. [name], [name] ([id number]) No fevers, chills, nausea, or vomiting noted at this time. 08/11/17; the patient really is doing quite nicely. Superficial wound now with nice granulation tissue. Much smaller. He is using his own external ace wrap and Hydrofera Blue 08/18/17; the wound is still having trouble with sticking to dressings including this week his collagen and hydrogel that he is apparently changing daily. 08/24/17; continued improvement in condition of this wound. Edema is well controlled. He has significant hemosiderin deposition around the wound however this is chronic. He has had laser ablation in 2016. I believe we suggested a follow-up of 8 and vascular exam not sure where we are with this Electronic Signature(s) Signed: 08/25/2017 4:30:21 AM By: Linton Ham MD Entered By: Linton Ham on 08/24/2017 08:20:37 [name], [name] ([id number]) -------------------------------------------------------------------------------- Physical Exam Details Patient Name: [name], [name] Date of Service: 08/24/2017 8:00 AM Medical Record Number: [id number] Patient Account Number: [id number] Date of Birth/Sex: 1956/01/16 (61 y.o. Male) Treating RN: Ahmed Prima Primary Care Provider: Esmond [name] Other Clinician: Referring Provider: Mingo [name], [name] Treating Provider/Extender: Ricard Dillon Weeks in Treatment: 7 Constitutional Patient is hypertensive.. Pulse regular and within target range for patient.Marland Kitchen Respirations regular, non-labored and within target range.. Temperature is normal and within the target range for the patient.Marland Kitchen appears in no distress. Eyes Conjunctivae clear. No discharge. Respiratory Respiratory effort is easy and symmetric bilaterally. Rate is normal at rest and on room air.. Cardiovascular Pedal pulses palpable on the right. Edema is well-controlled. Lymphatic And  palpable right popliteal or inguinal area. Psychiatric No evidence of depression, anxiety, or agitation. Calm, cooperative, and communicative. Appropriate interactions and affect.. Notes Wound exam; the patient appears to be making good progress. Smaller wound with healthy granulation. Measurements are improved no evidence of surrounding infection. Electronic Signature(s) Signed: 08/25/2017 4:30:21 AM By: Linton Ham MD Entered By: Linton Ham on 08/24/2017 08:21:57 Bauman, Divine L. ([id number]) -------------------------------------------------------------------------------- Physician Orders Details Patient Name: JAYE, POLIDORI L. Date of Service: 08/24/2017 8:00 AM Medical Record Number: [id number] Patient Account Number: [id number] Date of Birth/Sex: 08/30/1956 (61 y.o. Male) Treating RN: Cornell Barman Primary Care Provider: Mingo [name], [name] Other Clinician: Referring Provider: Mingo [name], [name] Treating Provider/Extender: [name] [name] in Treatment: 7 Verbal / Phone Orders: No Diagnosis Coding Wound Cleansing Wound #1 Right,Medial Malleolus o Clean wound with Normal Saline. o Cleanse wound with mild soap and water o May Shower, gently pat wound dry prior to applying new dressing. Anesthetic Wound #1 Right,Medial Malleolus o Topical Lidocaine 4% cream applied to wound bed prior to debridement Skin Barriers/Peri-Wound Care Wound #1 Right,Medial Malleolus o Triamcinolone Acetonide Ointment Primary Wound Dressing Wound #1 Right,Medial Malleolus o Hydrogel o Promogran - moisten with hydrogel Secondary Dressing Wound #1 Right,Medial Malleolus o Conform/Kerlix o Non-adherent pad Dressing Change Frequency Wound #1 Right,Medial Malleolus o Change dressing every other day. Follow-up Appointments Wound #1 Right,Medial Malleolus o Return Appointment in 1 week. o Nurse Visit as needed Edema Control Wound #1 Right,Medial Malleolus Adolf,  Chancelor L. ([id number]) o Elevate legs to the level of the heart  and pump ankles as often as possible o Other: - ace bandage Additional Orders / Instructions Wound #1 Right,Medial Malleolus o Increase protein intake. o Other: - Please add vitamin A, vitamin C and zinc supplements to your diet Electronic Signature(s) Signed: 08/24/2017 5:20:16 PM By: Gretta Cool, BSN, RN, CWS, Kim RN, BSN Signed: 08/25/2017 4:30:21 AM By: Linton Ham MD Entered By: Gretta Cool, BSN, RN, CWS, Kim on 08/24/2017 08:17:40 Latif, Rodarius L. ([id number]) -------------------------------------------------------------------------------- Problem List Details Patient Name: ESMERALDA, BLANFORD L. Date of Service: 08/24/2017 8:00 AM Medical Record Number: [id number] Patient Account Number: [id number] Date of Birth/Sex: December 02, 1956 (61 y.o. Male) Treating RN: Ahmed Prima Primary Care Provider: Esmond [name] Other Clinician: Referring Provider: Mingo [name], [name] Treating Provider/Extender: Ricard Dillon Weeks in Treatment: 7 Active Problems ICD-10 Encounter Code Description Active Date Diagnosis I87.2 Venous insufficiency (chronic) (peripheral) 06/30/2017 Yes L97.312 Non-pressure chronic ulcer of right ankle with fat layer 06/30/2017 Yes exposed [place] (primary) hypertension 06/30/2017 Yes Inactive Problems Resolved Problems Electronic Signature(s) Signed: 08/25/2017 4:30:21 AM By: Linton Ham MD Entered By: Linton Ham on 08/24/2017 08:17:34 Blass, Demarlo L. ([id number]) -------------------------------------------------------------------------------- Progress Note Details Patient Name: ERBIE, ARMENT L. Date of Service: 08/24/2017 8:00 AM Medical Record Number: [id number] Patient Account Number: [id number] Date of Birth/Sex: 06/06/1956 (61 y.o. Male) Treating RN: Ahmed Prima Primary Care Provider: Esmond [name] Other Clinician: Referring Provider: Mingo [name], [name] Treating Provider/Extender:  Ricard Dillon Weeks in Treatment: 7 Subjective History of Present Illness (HPI) 06/30/17 on evaluation today patient presents with a recurrent ulcer of the medial right ankle. He has had a region of atrophy and discoloration since he had vein strippin "years ago". Nonetheless she tells me at this point on evaluation today that this area reopened a 2 weeks ago and he initially contacted the [facility] wound center for an appointment but the appointment was so far out that he was unable to wait till that point. He therefore made an appointment here in [place] per the recommendation. He has been tolerating the wound from the standpoint of pain although he tells me it's not too painful. 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 He knows he should be using it however. Fortunately there's no evidence of infection in the wound appears to be doing well. 07/06/17; patient admitted to the clinic last week with a recurrent venous insufficiency ulcer on the medial right ankle. Fairly extensive open wound with surrounding erythematous discolored scan as well as small dilated veins inferiorly in the wound and medially in the foot. He describes tenderness actually lower down in the foot over these veins. His history goes back to vein stripping on the right leg 15 years ago. He was cared for in [place] when are sister clinic by Dr. Con Memos in 2016 at that time with a wound in the same spot. After he left the clinic he went on to laser ablation by Dr[facility]. I'll need to check these records. Per our intake nurse the wound looks better today. 07/14/17; patient complains of a lot of pain in the lower heel below the wound. There is discoloration here but certainly no different from last week. I did review the notes from Dr. Kellie Simmering he last saw him in  November 2016 post successful laser ablation of the right greater saphenous vein. Was noted he had severe skin changes in that  area even at that time 07/21/17; this patient continues to do really well although he is still having moderate drainage. Chronic venous insufficiency wound area with significant venous inflammation. He has had previous ablation by Dr. Kellie Simmering in 2016 of the right greater saphenous vein. Question is on the right medial ankle extending into the medial aspect of the calcaneus. We have been using silver alginate 07/28/17 on evaluation today patient tells me that he has been having some increased discomfort in the right lower extremity wound for the past several days although today it is actually better. He also tells me that he had some fevers although not measured as well as chills but again that is better today really it was only last night that he experienced those symptoms. Unfortunately she feels like there is a concern that he could have an infection in regard to the wound although he is not really certain about this. He fortunately has no bowel or bladder dysfunction no unexpected nausea vomiting or diarrhea. His pain today is much better around the a 2 out of 10 at rest however with cleansing of the wound this gets more severe rated to be a 5-6 out of 10 08/04/17 on evaluation today patient's right medial ankle ulcer appears to be doing much better. He has noted a dramatic improvement in the overall discomfort which I think is an excellent step and leap from where he was last week with 5-6 out of 10 pain. Fortunately there appears to be no evidence of infection. No fevers, chills, nausea, or vomiting noted at this time. 08/11/17; the patient really is doing quite nicely. Superficial wound now with nice granulation tissue. Much Laubscher, Steadman L. ([id number]) smaller. He is using his own external ace wrap and Hydrofera Blue 08/18/17; the wound is still having trouble with  sticking to dressings including this week his collagen and hydrogel that he is apparently changing daily. 08/24/17; continued improvement in condition of this wound. Edema is well controlled. He has significant hemosiderin deposition around the wound however this is chronic. He has had laser ablation in 2016. I believe we suggested a follow-up of 8 and vascular exam not sure where we are with this Objective Constitutional Patient is hypertensive.. Pulse regular and within target range for patient.Marland Kitchen Respirations regular, non-labored and within target range.. Temperature is normal and within the target range for the patient.Marland Kitchen appears in no distress. Vitals Time Taken: 8:04 AM, Height: 72 in, Weight: 245 lbs, BMI: 33.2, Temperature: 97.9 F, Pulse: 72 bpm, Respiratory Rate: 18 breaths/min, Blood Pressure: 158/86 mmHg. Eyes Conjunctivae clear. No discharge. Respiratory Respiratory effort is easy and symmetric bilaterally. Rate is normal at rest and on room air.. Cardiovascular Pedal pulses palpable on the right. Edema is well-controlled. Lymphatic And palpable right popliteal or inguinal area. Psychiatric No evidence of depression, anxiety, or agitation. Calm, cooperative, and communicative. Appropriate interactions and affect.. General Notes: Wound exam; the patient appears to be making good progress. Smaller wound with healthy granulation. Measurements are improved no evidence of surrounding infection. Integumentary (Hair, Skin) Wound #1 status is Open. Original cause of wound was Gradually Appeared. The wound is located on the Right,Medial Malleolus. The wound measures 0.7cm length x 1cm width x 0.1cm depth; 0.55cm^2 area and 0.055cm^3 volume. The wound is limited to skin breakdown. There is no tunneling or undermining noted. Pelster, Lajuan L. ([id number]) There is a medium amount of serosanguineous drainage noted. The wound margin is flat and intact. There is large (67-100%) red  granulation within  the wound bed. There is a small (1-33%) amount of necrotic tissue within the wound bed including Adherent Slough. The periwound skin appearance exhibited: Induration, Hemosiderin Staining. The periwound skin appearance did not exhibit: Callus, Crepitus, Excoriation, Rash, Scarring, Dry/Scaly, Maceration, Atrophie Blanche, Cyanosis, Ecchymosis, Mottled, Pallor, Rubor, Erythema. Periwound temperature was noted as No Abnormality. The periwound has tenderness on palpation. 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 Cleanse wound with mild soap and water May Shower, gently pat wound dry prior to applying new dressing. Anesthetic: Wound #1 Right,Medial Malleolus: Topical Lidocaine 4% cream applied to wound bed prior to debridement Skin Barriers/Peri-Wound Care: Wound #1 Right,Medial Malleolus: Triamcinolone Acetonide Ointment Primary Wound Dressing: Wound #1 Right,Medial Malleolus: Hydrogel Promogran - moisten with hydrogel Secondary Dressing: Wound #1 Right,Medial Malleolus: Conform/Kerlix Non-adherent pad Dressing Change Frequency: Wound #1 Right,Medial Malleolus: Change dressing every other day. Follow-up Appointments: Spraggins, Ogden L. ([id number]) Wound #1 Right,Medial Malleolus: Return Appointment in 1 week. Nurse Visit as needed Edema Control: Wound #1 Right,Medial Malleolus: Elevate legs to the level of the heart and pump ankles as often as possible Other: - ace bandage Additional Orders / Instructions: Wound #1 Right,Medial Malleolus: Increase protein intake. Other: - Please add vitamin A, vitamin C and zinc supplements to your diet continue with silvercollagen kerlix and conform own stocking going camping,  advisded to careful about wound hygeine Electronic Signature(s) Signed: 08/25/2017 4:30:21 AM By: Linton Ham MD Entered By: Linton Ham on 08/24/2017 08:23:22 Sinnett, Jerimah L. ([id number]) -------------------------------------------------------------------------------- SuperBill Details Patient Name: MERRITT, MCCRAVY L. Date of Service: 08/24/2017 Medical Record Number: [id number] Patient Account Number: [id number] Date of Birth/Sex: Sep 04, 1956 (61 y.o. Male) Treating RN: Cornell Barman Primary Care Provider: Mingo [name], [name] Other Clinician: Referring Provider: Mingo [name], [name] Treating Provider/Extender: [name] [name] in Treatment: 7 Diagnosis Coding ICD-10 Codes Code Description I87.2 Venous insufficiency (chronic) (peripheral) L97.312 Non-pressure chronic ulcer of right ankle with fat layer exposed [place] (primary) hypertension Facility Procedures CPT4 Code: [id number] Description: 54656 - WOUND CARE VISIT-LEV 2 EST PT Modifier: Quantity: 1 Physician Procedures CPT4 Code Description: [id number] 00174 - WC PHYS LEVEL 3 - EST PT ICD-10 Description Diagnosis L97.312 Non-pressure chronic ulcer of right ankle with fat Modifier: layer expose Quantity: 1 d Electronic Signature(s) Signed: 08/25/2017 4:30:21 AM By: Linton Ham MD Entered By: Linton Ham on 08/24/2017 08:23:44

## 2017-08-31 ENCOUNTER — Encounter: Payer: 59 | Admitting: Internal Medicine

## 2017-08-31 DIAGNOSIS — I872 Venous insufficiency (chronic) (peripheral): Secondary | ICD-10-CM | POA: Diagnosis not present

## 2017-08-31 DIAGNOSIS — L97312 Non-pressure chronic ulcer of right ankle with fat layer exposed: Secondary | ICD-10-CM | POA: Diagnosis not present

## 2017-08-31 DIAGNOSIS — L97311 Non-pressure chronic ulcer of right ankle limited to breakdown of skin: Secondary | ICD-10-CM | POA: Diagnosis not present

## 2017-08-31 DIAGNOSIS — I10 Essential (primary) hypertension: Secondary | ICD-10-CM | POA: Diagnosis not present

## 2017-09-01 NOTE — Progress Notes (Signed)
[name] [name], [name] ([id number]) Visit Report for 08/31/2017 HPI Details Patient Name: [name] [name], [name] [name]. Date of Service: 08/31/2017 3:30 PM Medical Record Number: [id number] Patient Account Number: [id number] Date of Birth/Sex: January 14, 1956 (61 y.o. Male) Treating RN: Ahmed Prima Primary Care Provider: Esmond Camper Other Clinician: Referring Provider: Mingo Amber, JEFFREY Treating Provider/Extender: Tito Dine in Treatment: 8 History of Present Illness HPI Description: 06/30/17 on evaluation today patient presents with a recurrent ulcer of the medial right ankle. He has had a region of atrophy and discoloration since he had vein strippin "years ago". Nonetheless she tells me at this point on evaluation today that this area reopened a 2 weeks ago and he initially contacted the [facility] wound center for an appointment but the appointment was so far out that he was unable to wait till that point. He therefore made an appointment here in [place] per the recommendation. He has been tolerating the wound from the standpoint of pain although he tells me it's not too painful. 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 He knows he should be using it however. Fortunately there's no evidence of infection in the wound appears to be doing well. 07/06/17; patient admitted to the clinic last week with a recurrent venous insufficiency ulcer on the medial right ankle. Fairly extensive open wound with surrounding erythematous discolored scan as well as small dilated veins inferiorly in the wound and medially in the foot. He describes tenderness actually lower down in the foot over these veins. His history goes back to vein stripping on the right leg 15 years ago. He was cared for in [place] when are sister clinic by Dr. Con Memos in 2016 at that time with a wound  in the same spot. After he left the clinic he went on to laser ablation by [facility] I'll need to check these records. Per our intake nurse the wound looks better today. 07/14/17; patient complains of a lot of pain in the lower heel below the wound. There is discoloration here but certainly no different from last week. I did review the notes from Dr. Kellie Simmering he last saw him in November 2016 post successful laser ablation of the right greater saphenous vein. Was noted he had severe skin changes in that area even at that time 07/21/17; this patient continues to do really well although he is still having moderate drainage. Chronic venous insufficiency wound area with significant venous inflammation. He has had previous ablation by Dr. Kellie Simmering in 2016 of the right greater saphenous vein. Question is on the right medial ankle extending into the medial aspect of the calcaneus. We have been using silver alginate 07/28/17 on evaluation today patient tells me that he has been having some increased discomfort in the right lower extremity wound for the past several days although today it is actually better. He also tells me that he had some fevers although not measured as well as chills but again that is better today really it was only last night that he experienced those symptoms. Unfortunately she feels like there is a concern that he could have an infection in regard to the wound although he is not really certain about this. He fortunately has no bowel or bladder dysfunction no unexpected nausea vomiting or diarrhea. His pain today is much better around the a 2 out of  10 at rest however with cleansing of the wound this gets more severe rated to be a 5-6 out of 10 08/04/17 on evaluation today patient's right medial ankle ulcer appears to be doing much better. He has noted a dramatic improvement in the overall discomfort which I think is an excellent step and leap from where he was last  week with 5-6 out of 10 pain. Fortunately there appears to be no evidence of infection. Glendening, Marquiz L. ([id number]) No fevers, chills, nausea, or vomiting noted at this time. 08/11/17; the patient really is doing quite nicely. Superficial wound now with nice granulation tissue. Much smaller. He is using his own external ace wrap and Hydrofera Blue 08/18/17; the wound is still having trouble with sticking to dressings including this week his collagen and hydrogel that he is apparently changing daily. 08/24/17; continued improvement in condition of this wound. Edema is well controlled. He has significant hemosiderin deposition around the wound however this is chronic. He has had laser ablation in 2016. I believe we suggested a follow-up of 8 and vascular exam not sure where we are with this 08/31/17; continued improvement in the condition as well as matter fact it is fully epithelialized. Edema is controlled. He is using an Ace wrap. He has had a previous laser ablation by [facility]. He has not yet made a follow-up appointment with them. We have been using silver collagen Electronic Signature(s) Signed: 08/31/2017 3:57:41 PM By: Linton Ham MD Entered By: Linton Ham on 08/31/2017 15:48:15 [name] [name], [name] ([id number]) -------------------------------------------------------------------------------- Physical Exam Details Patient Name: [name] [name], [name] Date of Service: 08/31/2017 3:30 PM Medical Record Number: [id number] Patient Account Number: [id number] Date of Birth/Sex: [birth year] (61 y.o. Male) Treating RN: Ahmed Prima Primary Care Provider: Esmond Camper Other Clinician: Referring Provider: Mingo Amber, JEFFREY Treating Provider/Extender: Ricard Dillon Weeks in Treatment: 8 Constitutional Sitting or standing Blood Pressure is within target range for patient.. Pulse regular and within target range for patient.Marland Kitchen Respirations regular, non-labored and within target  range.. Temperature is normal and within the target range for the patient.Marland Kitchen appears in no distress. Respiratory . Cardiovascular Pedal pulses palpable and strong bilaterally.. Notes Wound exam; the patient's wound is fully epithelialized except for tiny open areas. This should be healed next week. No evidence of surrounding infection. His edema is well controlled area he has significant hemosiderin deposition in this area however they wound has progressed towards closure Electronic Signature(s) Signed: 08/31/2017 3:57:41 PM By: Linton Ham MD Entered By: Linton Ham on 08/31/2017 15:49:29 Bossi, Aydn L. ([id number]) -------------------------------------------------------------------------------- Physician Orders Details Patient Name: [name] [name], [name] Date of Service: 08/31/2017 3:30 PM Medical Record Number: [id number] Patient Account Number: [id number] Date of Birth/Sex: June 13, 1956 (61 y.o. Male) Treating RN: Cornell Barman Primary Care Provider: Mingo Amber, JEFFREY Other Clinician: Referring Provider: Mingo Amber, JEFFREY Treating Provider/Extender: Tito Dine in Treatment: 8 Verbal / Phone Orders: No Diagnosis Coding ICD-10 Coding Code Description I87.2 Venous insufficiency (chronic) (peripheral) L97.312 Non-pressure chronic ulcer of right ankle with fat layer exposed I10 Essential (primary) hypertension Wound Cleansing Wound #1 Right,Medial Malleolus o Clean wound with Normal Saline. o Cleanse wound with mild soap and water o May Shower, gently pat wound dry prior to applying new dressing. Primary Wound Dressing Wound #1 Right,Medial Malleolus o Prisma Ag Secondary Dressing o Other - coverlet Dressing Change Frequency o Change dressing every other day. Follow-up Appointments Wound #1 Right,Medial Malleolus o Return Appointment in 1 week. Edema Control Wound #1 Right,Medial Malleolus o Elevate legs  to the level of the heart and pump ankles  as often as possible Additional Orders / Instructions Wound #1 Right,Medial Malleolus o Increase protein intake. o Other: - Please add vitamin A, vitamin C and zinc supplements to your diet Larmore, Tymeer L. ([id number]) Electronic Signature(s) Signed: 08/31/2017 3:57:41 PM By: Linton Ham MD Signed: 08/31/2017 4:02:39 PM By: Gretta Cool, BSN, RN, CWS, Kim RN, BSN Entered By: Gretta Cool, BSN, RN, CWS, Kim on 08/31/2017 15:45:15 Tanori, Joselito L. ([id number]) -------------------------------------------------------------------------------- Problem List Details Patient Name: [name] [name], [name] Date of Service: 08/31/2017 3:30 PM Medical Record Number: [id number] Patient Account Number: [id number] Date of Birth/Sex: Mar 23, 1956 (61 y.o. Male) Treating RN: Ahmed Prima Primary Care Provider: Esmond Camper Other Clinician: Referring Provider: Mingo Amber, JEFFREY Treating Provider/Extender: Ricard Dillon Weeks in Treatment: 8 Active Problems ICD-10 Encounter Code Description Active Date Diagnosis I87.2 Venous insufficiency (chronic) (peripheral) 06/30/2017 Yes L97.312 Non-pressure chronic ulcer of right ankle with fat layer 06/30/2017 Yes exposed [place] (primary) hypertension 06/30/2017 Yes Inactive Problems Resolved Problems Electronic Signature(s) Signed: 08/31/2017 3:57:41 PM By: Linton Ham MD Entered By: Linton Ham on 08/31/2017 15:42:06 [name] [name], [name] ([id number]) -------------------------------------------------------------------------------- Progress Note Details Patient Name: [name] [name], [name] Date of Service: 08/31/2017 3:30 PM Medical Record Number: [id number] Patient Account Number: [id number] Date of Birth/Sex: [birth year] (61 y.o. Male) Treating RN: Ahmed Prima Primary Care Provider: Esmond Camper Other Clinician: Referring Provider: Mingo Amber, JEFFREY Treating Provider/Extender: Ricard Dillon Weeks in Treatment: 8 Subjective History of Present  Illness (HPI) 06/30/17 on evaluation today patient presents with a recurrent ulcer of the medial right ankle. He has had a region of atrophy and discoloration since he had vein strippin "years ago". Nonetheless she tells me at this point on evaluation today that this area reopened a 2 weeks ago and he initially contacted the [facility] wound center for an appointment but the appointment was so far out that he was unable to wait till that point. He therefore made an appointment here in [place] per the recommendation. He has been tolerating the wound from the standpoint of pain although he tells me it's not too painful. 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 He knows he should be using it however. Fortunately there's no evidence of infection in the wound appears to be doing well. 07/06/17; patient admitted to the clinic last week with a recurrent venous insufficiency ulcer on the medial right ankle. Fairly extensive open wound with surrounding erythematous discolored scan as well as small dilated veins inferiorly in the wound and medially in the foot. He describes tenderness actually lower down in the foot over these veins. His history goes back to vein stripping on the right leg 15 years ago. He was cared for in [place] when are sister clinic by Dr. Con Memos in 2016 at that time with a wound in the same spot. After he left the clinic he went on to laser ablation by Dr. Kellie Simmering at [facility]. I'll need to check these records. Per our intake nurse the wound looks better today. 07/14/17; patient complains of a lot of pain in the lower heel below the wound. There is discoloration here but certainly no different from last week. I did review the notes from Dr. Kellie Simmering he last saw him in November 2016 post successful laser ablation of the right greater  saphenous vein. Was noted he  had severe skin changes in that area even at that time 07/21/17; this patient continues to do really well although he is still having moderate drainage. Chronic venous insufficiency wound area with significant venous inflammation. He has had previous ablation by Dr. Kellie Simmering in 2016 of the right greater saphenous vein. Question is on the right medial ankle extending into the medial aspect of the calcaneus. We have been using silver alginate 07/28/17 on evaluation today patient tells me that he has been having some increased discomfort in the right lower extremity wound for the past several days although today it is actually better. He also tells me that he had some fevers although not measured as well as chills but again that is better today really it was only last night that he experienced those symptoms. Unfortunately she feels like there is a concern that he could have an infection in regard to the wound although he is not really certain about this. He fortunately has no bowel or bladder dysfunction no unexpected nausea vomiting or diarrhea. His pain today is much better around the a 2 out of 10 at rest however with cleansing of the wound this gets more severe rated to be a 5-6 out of 10 08/04/17 on evaluation today patient's right medial ankle ulcer appears to be doing much better. He has noted a dramatic improvement in the overall discomfort which I think is an excellent step and leap from where he was last week with 5-6 out of 10 pain. Fortunately there appears to be no evidence of infection. No fevers, chills, nausea, or vomiting noted at this time. 08/11/17; the patient really is doing quite nicely. Superficial wound now with nice granulation tissue. Much [name] [name], [name] ([id number]) smaller. He is using his own external ace wrap and Hydrofera Blue 08/18/17; the wound is still having trouble with sticking to dressings including this week his collagen and hydrogel  that he is apparently changing daily. 08/24/17; continued improvement in condition of this wound. Edema is well controlled. He has significant hemosiderin deposition around the wound however this is chronic. He has had laser ablation in 2016. I believe we suggested a follow-up of 8 and vascular exam not sure where we are with this 08/31/17; continued improvement in the condition as well as matter fact it is fully epithelialized. Edema is controlled. He is using an Ace wrap. He has had a previous laser [facility] by [facility]. He has not yet made a follow-up appointment with them. We have been using silver collagen Objective Constitutional Sitting or standing Blood Pressure is within target range for patient.. Pulse regular and within target range for patient.Marland Kitchen Respirations regular, non-labored and within target range.. Temperature is normal and within the target range for the patient.Marland Kitchen appears in no distress. Vitals Time Taken: 3:28 PM, Height: 72 in, Weight: 245 lbs, BMI: 33.2, Temperature: 98.0 F, Pulse: 81 bpm, Respiratory Rate: 16 breaths/min, Blood Pressure: 142/89 mmHg. Cardiovascular Pedal pulses palpable and strong bilaterally.. General Notes: Wound exam; the patient's wound is fully epithelialized except for tiny open areas. This should be healed next week. No evidence of surrounding infection. His edema is well controlled area he has significant hemosiderin deposition in this area however they wound has progressed towards closure Integumentary (Hair, Skin) Wound #1 status is Open. Original cause of wound was Gradually Appeared. The wound is located on the Right,Medial Malleolus. The wound measures 0.3cm length x 0.5cm width x 0.1cm depth; 0.118cm^2 area and 0.012cm^3 volume. The wound is limited  to skin breakdown. There is no tunneling or undermining noted. There is a medium amount of serosanguineous drainage noted. The wound margin is flat and intact. There is large (67-100%)  red granulation within the wound bed. There is no necrotic tissue within the wound bed. The periwound skin appearance exhibited: Hemosiderin Staining. The periwound skin appearance did not exhibit: Callus, Crepitus, Excoriation, Induration, Rash, Scarring, Dry/Scaly, Maceration, Atrophie Blanche, Cyanosis, Ecchymosis, Mottled, Pallor, Rubor, Erythema. Periwound temperature was noted as No Abnormality. The periwound has tenderness on palpation. [name] [name], [name] 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 Cleanse wound with mild soap and water May Shower, gently pat wound dry prior to applying new dressing. Primary Wound Dressing: Wound #1 Right,Medial Malleolus: Prisma Ag Secondary Dressing: Other - coverlet Dressing Change Frequency: Change dressing every other day. Follow-up Appointments: Wound #1 Right,Medial Malleolus: Return Appointment in 1 week. Edema Control: Wound #1 Right,Medial Malleolus: Elevate legs to the level of the heart and pump ankles as often as possible Additional Orders / Instructions: Wound #1 Right,Medial Malleolus: Increase protein intake. Other: - Please add vitamin A, vitamin C and zinc supplements to your diet #1 right medial malleolus. Wound is fully epithelialized but still reasonably mature. #2 advised to keep this area covered protected and compressed for another week. He continues his 3040 compression stockings [name] [name], [name] ([id number]) #3 he has not yet made follow-up with pain and vascular but I think given the condition of the area a follow- up is indicated to see if anything further can be done from a surgical point of view #4 I think this will be healed by next week. The patient is  confident that the area is healed he can call us and tell us so so we can discharge him is healed in our system. Otherwise we'll give minimal appointment for follow-up of the vulnerable areas Electronic Signature(s) Signed: 08/31/2017 3:57:41 PM By: Linton Ham MD Entered By: Linton Ham on 08/31/2017 15:56:18 [name] [name], [name] [name] ([id number]) -------------------------------------------------------------------------------- SuperBill Details Patient Name: SHAYA, ALTAMURA L. Date of Service: 08/31/2017 Medical Record Number: [id number] Patient Account Number: [id number] Date of Birth/Sex: May 23, 1956 (61 y.o. Male) Treating RN: Ahmed Prima Primary Care Provider: Esmond Camper Other Clinician: Referring Provider: Mingo Amber, JEFFREY Treating Provider/Extender: Tito Dine in Treatment: 8 Diagnosis Coding ICD-10 Codes Code Description I87.2 Venous insufficiency (chronic) (peripheral) L97.312 Non-pressure chronic ulcer of right ankle with fat layer exposed I10 Essential (primary) hypertension Physician Procedures CPT4 Code Description: [id number] - WC PHYS LEVEL 2 - EST PT ICD-10 Description Diagnosis L97.312 Non-pressure chronic ulcer of right ankle with fat Modifier: layer expose Quantity: 1 d Electronic Signature(s) Signed: 08/31/2017 3:57:41 PM By: Linton Ham MD Entered By: Linton Ham on 08/31/2017 15:56:48

## 2017-09-01 NOTE — Progress Notes (Addendum)
[name], [name] ([id number]) Visit Report for 08/31/2017 Arrival Information Details Patient Name: [name] [name], [name] [name]. Date of Service: 08/31/2017 3:30 PM Medical Record Number: [id number] Patient Account Number: [id number] Date of Birth/Sex: [birth year] (61 y.o. Male) Treating RN: Cornell Barman Primary Care Rasheedah Reis: Mingo Amber, JEFFREY Other Clinician: Referring Lourdes Manning: Mingo Amber, JEFFREY Treating Javyon Fontan/Extender: Tito Dine in Treatment: 8 Visit Information History Since Last Visit All ordered tests and consults were completed: No Patient Arrived: Ambulatory Added or deleted any medications: No Arrival Time: 15:27 Any new allergies or adverse reactions: No Accompanied By: self Had a fall or experienced change in No Transfer Assistance: None activities of daily living that may affect Patient Identification Verified: Yes risk of falls: Secondary Verification Process Yes Signs or symptoms of abuse/neglect since last No Completed: visito Patient Requires Transmission-Based No Has Dressing in Place as Prescribed: Yes Precautions: Pain Present Now: No Patient Has Alerts: No Electronic Signature(s) Signed: 08/31/2017 4:02:39 PM By: Gretta Cool, BSN, RN, CWS, Kim RN, BSN Entered By: Gretta Cool, BSN, RN, CWS, Kim on 08/31/2017 15:27:46 Burnley, Noha L. ([id number]) -------------------------------------------------------------------------------- Clinic Level of Care Assessment Details Patient Name: [name], [name] Date of Service: 08/31/2017 3:30 PM Medical Record Number: [id number] Patient Account Number: [id number] Date of Birth/Sex: [birth year] (61 y.o. Male) Treating RN: Cornell Barman Primary Care Jandi Swiger: Mingo Amber, JEFFREY Other Clinician: Referring Cindy Fullman: Mingo Amber, JEFFREY Treating Timber Lucarelli/Extender: Tito Dine in Treatment: 8 Clinic Level of Care Assessment Items TOOL 4 Quantity Score []  - Use when only an EandM is performed on FOLLOW-UP visit 0 ASSESSMENTS -  Nursing Assessment / Reassessment []  - Reassessment of Co-morbidities (includes updates in patient status) 0 X - Reassessment of Adherence to Treatment Plan 1 5 ASSESSMENTS - Wound and Skin Assessment / Reassessment X - Simple Wound Assessment / Reassessment - one wound 1 5 []  - Complex Wound Assessment / Reassessment - multiple wounds 0 []  - Dermatologic / Skin Assessment (not related to wound area) 0 ASSESSMENTS - Focused Assessment []  - Circumferential Edema Measurements - multi extremities 0 []  - Nutritional Assessment / Counseling / Intervention 0 []  - Lower Extremity Assessment (monofilament, tuning fork, pulses) 0 []  - Peripheral Arterial Disease Assessment (using hand held doppler) 0 ASSESSMENTS - Ostomy and/or Continence Assessment and Care []  - Incontinence Assessment and Management 0 []  - Ostomy Care Assessment and Management (repouching, etc.) 0 PROCESS - Coordination of Care X - Simple Patient / Family Education for ongoing care 1 15 []  - Complex (extensive) Patient / Family Education for ongoing care 0 []  - Staff obtains Programmer, systems, Records, Test Results / Process Orders 0 []  - Staff telephones HHA, Nursing Homes / Clarify orders / etc 0 []  - Routine Transfer to another Facility (non-emergent condition) 0 Stradley, Sheron L. ([id number]) []  - Routine Hospital Admission (non-emergent condition) 0 []  - New Admissions / Biomedical engineer / Ordering NPWT, Apligraf, etc. 0 []  - Emergency Hospital Admission (emergent condition) 0 X - Simple Discharge Coordination 1 10 []  - Complex (extensive) Discharge Coordination 0 PROCESS - Special Needs []  - Pediatric / Minor Patient Management 0 []  - Isolation Patient Management 0 []  - Hearing / Language / Visual special needs 0 []  - Assessment of Community assistance (transportation, D/C planning, etc.) 0 []  - Additional assistance / Altered mentation 0 []  - Support Surface(s) Assessment (bed, cushion, seat, etc.) 0 INTERVENTIONS -  Wound Cleansing / Measurement X - Simple Wound Cleansing - one wound 1 5 []  - Complex Wound Cleansing - multiple wounds 0 []  - Wound  Imaging (photographs - any number of wounds) 0 []  - Wound Tracing (instead of photographs) 0 X - Simple Wound Measurement - one wound 1 5 []  - Complex Wound Measurement - multiple wounds 0 INTERVENTIONS - Wound Dressings X - Small Wound Dressing one or multiple wounds 1 10 []  - Medium Wound Dressing one or multiple wounds 0 []  - Large Wound Dressing one or multiple wounds 0 []  - Application of Medications - topical 0 []  - Application of Medications - injection 0 INTERVENTIONS - Miscellaneous []  - External ear exam 0 Mccutchen, Arlis L. ([id number]) []  - Specimen Collection (cultures, biopsies, blood, body fluids, etc.) 0 []  - Specimen(s) / Culture(s) sent or taken to Lab for analysis 0 []  - Patient Transfer (multiple staff / Harrel Lemon Lift / Similar devices) 0 []  - Simple Staple / Suture removal (25 or less) 0 []  - Complex Staple / Suture removal (26 or more) 0 []  - Hypo / Hyperglycemic Management (close monitor of Blood Glucose) 0 []  - Ankle / Brachial Index (ABI) - do not check if billed separately 0 []  - Vital Signs 0 Has the patient been seen at the hospital within the last three years: Yes Total Score: 55 Level Of Care: New/Established - Level 2 Electronic Signature(s) Signed: 09/02/2017 5:55:00 PM By: Gretta Cool, BSN, RN, CWS, Kim RN, BSN Entered By: Gretta Cool, BSN, RN, CWS, Kim on 09/02/2017 10:01:18 Rispoli, [place] ([id number]) -------------------------------------------------------------------------------- Encounter Discharge Information Details Patient Name: [name], [name] Date of Service: 08/31/2017 3:30 PM Medical Record Number: [id number] Patient Account Number: [id number] Date of Birth/Sex: Oct 31, 1956 (61 y.o. Male) Treating RN: Cornell Barman Primary Care Breelle Hollywood: Mingo Amber, JEFFREY Other Clinician: Referring Aryel Edelen: Mingo Amber, JEFFREY Treating  Jazzelle Zhang/Extender: Tito Dine in Treatment: 8 Encounter Discharge Information Items Discharge Pain Level: 0 Discharge Condition: Stable Ambulatory Status: Ambulatory Discharge Destination: Home Private Transportation: Auto Accompanied By: self Schedule Follow-up Appointment: Yes Medication Reconciliation completed and Yes provided to Patient/Care Lucilia Yanni: Clinical Summary of Care: Electronic Signature(s) Signed: 08/31/2017 4:02:39 PM By: Gretta Cool, BSN, RN, CWS, Kim RN, BSN Entered By: Gretta Cool, BSN, RN, CWS, Kim on 08/31/2017 15:45:43 Amir, Turon L. ([id number]) -------------------------------------------------------------------------------- Lower Extremity Assessment Details Patient Name: ZAKARIA, SEDOR. Date of Service: 08/31/2017 3:30 PM Medical Record Number: [id number] Patient Account Number: [id number] Date of Birth/Sex: 1956-08-03 (61 y.o. Male) Treating RN: Cornell Barman Primary Care Trevonn Hallum: Mingo Amber, JEFFREY Other Clinician: Referring Modupe Shampine: Mingo Amber, JEFFREY Treating Nolin Grell/Extender: Tito Dine in Treatment: 8 Vascular Assessment Pulses: Dorsalis Pedis Palpable: [Right:Yes] Posterior Tibial Extremity colors, hair growth, and conditions: Extremity Color: [Right:Normal] Hair Growth on Extremity: [Right:Yes] Temperature of Extremity: [Right:Warm] Capillary Refill: [Right:< 3 seconds] Dependent Rubor: [Right:No] Blanched when Elevated: [Right:No] Toe Nail Assessment Left: Right: Thick: No Discolored: No Deformed: No Improper Length and Hygiene: No Electronic Signature(s) Signed: 08/31/2017 4:02:39 PM By: Gretta Cool, BSN, RN, CWS, Kim RN, BSN Entered By: Gretta Cool, BSN, RN, CWS, Kim on 08/31/2017 15:43:41 Starry, Dima L. ([id number]) -------------------------------------------------------------------------------- Multi Wound Chart Details Patient Name: TKAI, SERFASS L. Date of Service: 08/31/2017 3:30 PM Medical Record Number:  [id number] Patient Account Number: [id number] Date of Birth/Sex: 13-Aug-1956 (61 y.o. Male) Treating RN: Ahmed Prima Primary Care Esti Demello: Esmond Camper Other Clinician: Referring Aishia Barkey: Mingo Amber, JEFFREY Treating Jadelynn Boylan/Extender: Ricard Dillon Weeks in Treatment: 8 Vital Signs Height(in): 72 Pulse(bpm): 81 Weight(lbs): 245 Blood Pressure 142/89 (mmHg): Body Mass Index(BMI): 33 Temperature(F): 98.0 Respiratory Rate 16 (breaths/min): Photos: [N/A:N/A] Wound Location: Right Malleolus - Medial N/A N/A Wounding Event: Gradually Appeared N/A N/A Primary Etiology:  Venous Leg Ulcer N/A N/A Comorbid History: Hypertension N/A N/A Date Acquired: 06/15/2017 N/A N/A Weeks of Treatment: 8 N/A N/A Wound Status: Open N/A N/A Measurements L x W x D 0.3x0.5x0.1 N/A N/A (cm) Area (cm) : 0.118 N/A N/A Volume (cm) : 0.012 N/A N/A % Reduction in Area: 98.90% N/A N/A % Reduction in Volume: 98.90% N/A N/A Classification: Full Thickness Without N/A N/A Exposed Support Structures Exudate Amount: Medium N/A N/A Exudate Type: Serosanguineous N/A N/A Exudate Color: red, brown N/A N/A Wound Margin: Flat and Intact N/A N/A Granulation Amount: Large (67-100%) N/A N/A Granulation Quality: Red N/A N/A Necrotic Amount: None Present (0%) N/A N/A Exposed Structures: N/A N/A Tokarski, Coston L. ([id number]) Fascia: No Fat Layer (Subcutaneous Tissue) Exposed: No Tendon: No Muscle: No Joint: No Bone: No Limited to Skin Breakdown Epithelialization: Large (67-100%) N/A N/A Periwound Skin Texture: Excoriation: No N/A N/A Induration: No Callus: No Crepitus: No Rash: No Scarring: No Periwound Skin Maceration: No N/A N/A Moisture: Dry/Scaly: No Periwound Skin Color: Hemosiderin Staining: Yes N/A N/A Atrophie Blanche: No Cyanosis: No Ecchymosis: No Erythema: No Mottled: No Pallor: No Rubor: No Temperature: No Abnormality N/A N/A Tenderness on Yes N/A N/A Palpation: Wound  Preparation: Ulcer Cleansing: N/A N/A Rinsed/Irrigated with Saline Topical Anesthetic Applied: None Treatment Notes Wound #1 (Right, Medial Malleolus) 1. Cleansed with: Cleanse wound with antibacterial soap and water 4. Dressing Applied: Prisma Ag Notes Soil scientist) Signed: 08/31/2017 3:57:41 PM By: Linton Ham MD Vought, Yisroel L. ([id number]) Entered By: Linton Ham on 08/31/2017 15:47:18 Lao, [place] ([id number]) -------------------------------------------------------------------------------- [place] Details Patient Name: KEAHI, MCCARNEY. Date of Service: 08/31/2017 3:30 PM Medical Record Number: [id number] Patient Account Number: [id number] Date of Birth/Sex: [birth year] (61 y.o. Male) Treating RN: Cornell Barman Primary Care Ayriel Texidor: Esmond Camper Other Clinician: Referring Lelaina Oatis: Esmond Camper Treating Lars Jeziorski/Extender: Tito Dine in Treatment: 8 Active Inactive Electronic Signature(s) Signed: 09/16/2017 11:33:34 AM By: Gretta Cool, BSN, RN, CWS, Kim RN, BSN Previous Signature: 09/02/2017 5:55:00 PM Version By: Gretta Cool, BSN, RN, CWS, Kim RN, BSN Entered By: Gretta Cool, BSN, RN, CWS, Kim on 09/16/2017 11:33:34 Fiser, Alcides L. ([id number]) -------------------------------------------------------------------------------- Pain Assessment Details Patient Name: KARIM, AIELLO. Date of Service: 08/31/2017 3:30 PM Medical Record Number: [id number] Patient Account Number: [id number] Date of Birth/Sex: September 03, 1956 (61 y.o. Male) Treating RN: Cornell Barman Primary Care Yiselle Babich: Mingo Amber, JEFFREY Other Clinician: Referring Kaylynn Chamblin: Mingo Amber, JEFFREY Treating Lania Zawistowski/Extender: Tito Dine in Treatment: 8 Active Problems Location of Pain Severity and Description of Pain Patient Has Paino No Site Locations With Dressing Change: No Pain Management and Medication Current Pain Management: Goals for Pain  Management Topical or injectable lidocaine is offered to patient for acute pain when surgical debridement is performed. If needed, Patient is instructed to use over the counter pain medication for the following 24-48 hours after debridement. Wound care MDs do not prescribed pain medications. Patient has chronic pain or uncontrolled pain. Patient has been instructed to make an appointment with their Primary Care Physician for pain management. Electronic Signature(s) Signed: 08/31/2017 4:02:39 PM By: Gretta Cool, BSN, RN, CWS, Kim RN, BSN Entered By: Gretta Cool, BSN, RN, CWS, Kim on 08/31/2017 15:27:57 Kulzer, Osher L. ([id number]) -------------------------------------------------------------------------------- Patient/Caregiver Education Details Patient Name: NATHANEL, TALLMAN L. Date of Service: 08/31/2017 3:30 PM Medical Record Number: [id number] Patient Account Number: [id number] Date of Birth/Gender: [birth year] (61 y.o. Male) Treating RN: Cornell Barman Primary Care Physician: Mingo Amber, JEFFREY Other Clinician: Referring Physician: Mingo Amber, JEFFREY Treating Physician/Extender: Ricard Dillon Weeks in Treatment:  8 Education Assessment Education Provided To: Patient Education Topics Provided Wound/Skin Impairment: Handouts: Caring for Your Ulcer Methods: Demonstration Responses: State content correctly Electronic Signature(s) Signed: 08/31/2017 4:02:39 PM By: Gretta Cool, BSN, RN, CWS, Kim RN, BSN Entered By: Gretta Cool, BSN, RN, CWS, Kim on 08/31/2017 15:45:55 Conkey, Nikita L. ([id number]) -------------------------------------------------------------------------------- Wound Assessment Details Patient Name: SPIKE, DESILETS L. Date of Service: 08/31/2017 3:30 PM Medical Record Number: [id number] Patient Account Number: [id number] Date of Birth/Sex: [birth year] (61 y.o. Male) Treating RN: Cornell Barman Primary Care Darci Lykins: Mingo Amber, JEFFREY Other Clinician: Referring Kamdyn Covel: Mingo Amber, JEFFREY Treating  Altair Appenzeller/Extender: Ricard Dillon Weeks in Treatment: 8 Wound Status Wound Number: 1 Primary Etiology: Venous Leg Ulcer Wound Location: Right Malleolus - Medial Wound Status: Open Wounding Event: Gradually Appeared Comorbid History: Hypertension Date Acquired: 06/15/2017 Weeks Of Treatment: 8 Clustered Wound: No Photos Wound Measurements Length: (cm) 0.3 Width: (cm) 0.5 Depth: (cm) 0.1 Area: (cm) 0.118 Volume: (cm) 0.012 % Reduction in Area: 98.9% % Reduction in Volume: 98.9% Epithelialization: Large (67-100%) Tunneling: No Undermining: No Wound Description Full Thickness Without Exposed Classification: Support Structures Wound Margin: Flat and Intact Exudate Medium Amount: Exudate Type: Serosanguineous Exudate Color: red, brown Foul Odor After Cleansing: No Slough/Fibrino Yes Wound Bed Granulation Amount: Large (67-100%) Exposed Structure Granulation Quality: Red Fascia Exposed: No Necrotic Amount: None Present (0%) Fat Layer (Subcutaneous Tissue) Exposed: No Tendon Exposed: No Muscle Exposed: No Joint Exposed: No Bissonette, Kishawn L. ([id number]) Bone Exposed: No Limited to Skin Breakdown Periwound Skin Texture Texture Color No Abnormalities Noted: No No Abnormalities Noted: No Callus: No Atrophie Blanche: No Crepitus: No Cyanosis: No Excoriation: No Ecchymosis: No Induration: No Erythema: No Rash: No Hemosiderin Staining: Yes Scarring: No Mottled: No Pallor: No Moisture Rubor: No No Abnormalities Noted: No Dry / Scaly: No Temperature / Pain Maceration: No Temperature: No Abnormality Tenderness on Palpation: Yes Wound Preparation Ulcer Cleansing: Rinsed/Irrigated with Saline Topical Anesthetic Applied: None Electronic Signature(s) Signed: 08/31/2017 4:02:39 PM By: Gretta Cool, BSN, RN, CWS, Kim RN, BSN Entered By: Gretta Cool, BSN, RN, CWS, Kim on 08/31/2017 15:33:39 Sesma, Chrles L.  ([id number]) -------------------------------------------------------------------------------- Vitals Details Patient Name: TORSTEN, WENIGER L. Date of Service: 08/31/2017 3:30 PM Medical Record Number: [id number] Patient Account Number: [id number] Date of Birth/Sex: 05-30-1956 (61 y.o. Male) Treating RN: Cornell Barman Primary Care Aicha Clingenpeel: Mingo Amber, JEFFREY Other Clinician: Referring Kyllie Pettijohn: Mingo Amber, JEFFREY Treating Lochlann Mastrangelo/Extender: Tito Dine in Treatment: 8 Vital Signs Time Taken: 15:28 Temperature (F): 98.0 Height (in): 72 Pulse (bpm): 81 Weight (lbs): 245 Respiratory Rate (breaths/min): 16 Body Mass Index (BMI): 33.2 Blood Pressure (mmHg): 142/89 Reference Range: 80 - 120 mg / dl Electronic Signature(s) Signed: 08/31/2017 4:02:39 PM By: Gretta Cool, BSN, RN, CWS, Kim RN, BSN Entered By: Gretta Cool, BSN, RN, CWS, Kim on 08/31/2017 15:28:30

## 2017-10-07 DIAGNOSIS — M545 Low back pain: Secondary | ICD-10-CM | POA: Diagnosis not present

## 2017-10-07 DIAGNOSIS — M9903 Segmental and somatic dysfunction of lumbar region: Secondary | ICD-10-CM | POA: Diagnosis not present

## 2017-10-07 DIAGNOSIS — M9904 Segmental and somatic dysfunction of sacral region: Secondary | ICD-10-CM | POA: Diagnosis not present

## 2017-10-07 DIAGNOSIS — M9905 Segmental and somatic dysfunction of pelvic region: Secondary | ICD-10-CM | POA: Diagnosis not present

## 2017-10-11 DIAGNOSIS — M9905 Segmental and somatic dysfunction of pelvic region: Secondary | ICD-10-CM | POA: Diagnosis not present

## 2017-10-11 DIAGNOSIS — M545 Low back pain: Secondary | ICD-10-CM | POA: Diagnosis not present

## 2017-10-11 DIAGNOSIS — M9903 Segmental and somatic dysfunction of lumbar region: Secondary | ICD-10-CM | POA: Diagnosis not present

## 2017-10-11 DIAGNOSIS — M9904 Segmental and somatic dysfunction of sacral region: Secondary | ICD-10-CM | POA: Diagnosis not present

## 2017-10-21 DIAGNOSIS — M9905 Segmental and somatic dysfunction of pelvic region: Secondary | ICD-10-CM | POA: Diagnosis not present

## 2017-10-21 DIAGNOSIS — M9903 Segmental and somatic dysfunction of lumbar region: Secondary | ICD-10-CM | POA: Diagnosis not present

## 2017-10-21 DIAGNOSIS — M545 Low back pain: Secondary | ICD-10-CM | POA: Diagnosis not present

## 2017-10-21 DIAGNOSIS — M9904 Segmental and somatic dysfunction of sacral region: Secondary | ICD-10-CM | POA: Diagnosis not present

## 2017-11-01 ENCOUNTER — Other Ambulatory Visit: Payer: Self-pay | Admitting: Family Medicine

## 2017-11-01 MED FILL — PANTOPRAZOLE SOD DR 40 MG T: 40 | 90 days supply | Qty: 90 | Fill #2

## 2017-11-02 MED FILL — HYDROCHLOROTHIAZIDE 25 MG T: 25 | 90 days supply | Qty: 90 | Fill #0

## 2017-11-10 DIAGNOSIS — M25512 Pain in left shoulder: Secondary | ICD-10-CM | POA: Diagnosis not present

## 2017-11-10 DIAGNOSIS — M79642 Pain in left hand: Secondary | ICD-10-CM | POA: Diagnosis not present

## 2017-11-10 MED FILL — HYDROCODON-APAP 10-325: 10-325 | 8 days supply | Qty: 30 | Fill #0

## 2017-12-06 DIAGNOSIS — M25511 Pain in right shoulder: Secondary | ICD-10-CM | POA: Diagnosis not present

## 2017-12-06 MED FILL — MELOXICAM 15 MG TABLET: 15 | 30 days supply | Qty: 30 | Fill #0

## 2017-12-06 MED FILL — CYCLOBENZAPRINE 5 MG TABLET: 5 | 20 days supply | Qty: 20 | Fill #0

## 2017-12-16 ENCOUNTER — Ambulatory Visit (INDEPENDENT_AMBULATORY_CARE_PROVIDER_SITE_OTHER): Payer: 59 | Admitting: Family Medicine

## 2017-12-16 ENCOUNTER — Encounter: Payer: Self-pay | Admitting: Family Medicine

## 2017-12-16 ENCOUNTER — Other Ambulatory Visit: Payer: Self-pay

## 2017-12-16 VITALS — BP 120/82 | HR 101 | Temp 98.5°F | Ht 72.0 in | Wt 248.0 lb

## 2017-12-16 DIAGNOSIS — L918 Other hypertrophic disorders of the skin: Secondary | ICD-10-CM

## 2017-12-16 NOTE — Progress Notes (Signed)
    Subjective:  [name] [name] is a 61 y.o. male who presents to the [facility] today with a chief complaint of skin tags needing removal.   HPI: He has multiple (50+) skin tags mainly in axilla bilaterally but some along collar of neck.  He has had some removed in the past but they come back in a few years.   They have been most succesful with cryo or cauterization.  Objective:  Physical Exam: BP 120/82   Pulse (!) 101   Temp 98.5 F (36.9 C) (Oral)   Ht 6' (1.829 m)   Wt 248 lb (112.5 kg)   SpO2 98%   BMI 33.63 kg/m   Gen: NAD, resting comfortably CV: RRR with no murmurs appreciated Pulm: NWOB, CTAB with no crackles, wheezes, or rhonchi GI: Normal bowel sounds present. Soft, Nontender, Nondistended. MSK: no edema, cyanosis, or clubbing noted Skin: warm, dry.   Many skin tags, large enough to cause likely bleeding if not cauterized.  No current sign of infection/wounding. Neuro: grossly normal, moves all extremities Psych: Normal affect and thought content  No results found for this or any previous visit (from the past 72 hour(s)).   Assessment/Plan:  Skin tag Referring to derm based on number/size of skin tags and [place] not having the cryotherapy materials needed.  Patient will not be charged for today   Sherene Sires, [place] - PGY1 12/16/2017 4:16 PM

## 2017-12-16 NOTE — Assessment & Plan Note (Signed)
Referring to derm based on number/size of skin tags and [place] not having the cryotherapy materials needed.  Patient will not be charged for today

## 2017-12-16 NOTE — Patient Instructions (Signed)
It was a pleasure to see you today! Thank you for choosing [facility] for your primary care. [name] [name] was seen for skin tags needing removal. Come back to the clinic if you have any concerns, and go to the emergency room if you have any life threatening issues.  We are sorry that we were unable to have the materials you needed for your treatment today.   We're going to refer you to dermatology and not charge you for today's visit.  If we did any lab work today, and the results require attention, either me or my nurse will get in touch with you. If everything is normal, you will get a letter in mail and a message via . If you don't hear from [place] in two weeks, please give [place] a call. Otherwise, we look forward to seeing you again at your next visit. If you have any questions or concerns before then, please call the clinic at [phone].  Please bring all your medications to every doctors visit  Sign up for My Chart to have easy access to your labs results, and communication with your Primary care physician.    Please check-out at the front desk before leaving the clinic.    Best,  Dr. Sherene Sires FAMILY MEDICINE RESIDENT - PGY1 12/16/2017 4:13 PM

## 2018-01-21 MED FILL — MELOXICAM 15 MG TABLET: 15 | 30 days supply | Qty: 30 | Fill #1

## 2018-02-17 ENCOUNTER — Other Ambulatory Visit: Payer: Self-pay | Admitting: Family Medicine

## 2018-02-17 MED FILL — PANTOPRAZOLE SOD DR 40 MG T: 40 | 90 days supply | Qty: 90 | Fill #0

## 2018-02-17 MED FILL — HYDROCHLOROTHIAZIDE 25 MG T: 25 | 90 days supply | Qty: 90 | Fill #1

## 2018-02-24 DIAGNOSIS — L918 Other hypertrophic disorders of the skin: Secondary | ICD-10-CM | POA: Diagnosis not present

## 2018-02-24 DIAGNOSIS — D2239 Melanocytic nevi of other parts of face: Secondary | ICD-10-CM | POA: Diagnosis not present

## 2018-03-07 DIAGNOSIS — M546 Pain in thoracic spine: Secondary | ICD-10-CM | POA: Diagnosis not present

## 2018-03-07 DIAGNOSIS — M62838 Other muscle spasm: Secondary | ICD-10-CM | POA: Diagnosis not present

## 2018-03-07 DIAGNOSIS — M1812 Unilateral primary osteoarthritis of first carpometacarpal joint, left hand: Secondary | ICD-10-CM | POA: Diagnosis not present

## 2018-03-07 DIAGNOSIS — M9902 Segmental and somatic dysfunction of thoracic region: Secondary | ICD-10-CM | POA: Diagnosis not present

## 2018-03-17 DIAGNOSIS — L918 Other hypertrophic disorders of the skin: Secondary | ICD-10-CM | POA: Diagnosis not present

## 2018-04-21 DIAGNOSIS — D2339 Other benign neoplasm of skin of other parts of face: Secondary | ICD-10-CM | POA: Diagnosis not present

## 2018-04-21 DIAGNOSIS — D3611 Benign neoplasm of peripheral nerves and autonomic nervous system of face, head, and neck: Secondary | ICD-10-CM | POA: Diagnosis not present

## 2018-04-21 DIAGNOSIS — L918 Other hypertrophic disorders of the skin: Secondary | ICD-10-CM | POA: Diagnosis not present

## 2018-04-21 DIAGNOSIS — D485 Neoplasm of uncertain behavior of skin: Secondary | ICD-10-CM | POA: Diagnosis not present

## 2018-05-06 MED FILL — HYDROCHLOROTHIAZIDE 25 MG T: 25 | 90 days supply | Qty: 90 | Fill #2

## 2018-05-26 MED FILL — PANTOPRAZOLE SOD DR 40 MG T: 40 | 90 days supply | Qty: 90 | Fill #1

## 2018-06-02 DIAGNOSIS — H524 Presbyopia: Secondary | ICD-10-CM | POA: Diagnosis not present

## 2018-06-02 DIAGNOSIS — H52223 Regular astigmatism, bilateral: Secondary | ICD-10-CM | POA: Diagnosis not present

## 2018-06-02 DIAGNOSIS — H5213 Myopia, bilateral: Secondary | ICD-10-CM | POA: Diagnosis not present

## 2018-06-02 DIAGNOSIS — H2513 Age-related nuclear cataract, bilateral: Secondary | ICD-10-CM | POA: Diagnosis not present

## 2018-06-14 ENCOUNTER — Encounter: Payer: Self-pay | Admitting: Family Medicine

## 2018-06-14 ENCOUNTER — Other Ambulatory Visit: Payer: Self-pay

## 2018-06-14 ENCOUNTER — Ambulatory Visit (INDEPENDENT_AMBULATORY_CARE_PROVIDER_SITE_OTHER): Payer: 59 | Admitting: Family Medicine

## 2018-06-14 VITALS — BP 144/88 | HR 77 | Temp 98.5°F | Ht 72.0 in | Wt 252.6 lb

## 2018-06-14 DIAGNOSIS — E785 Hyperlipidemia, unspecified: Secondary | ICD-10-CM

## 2018-06-14 DIAGNOSIS — M6790 Unspecified disorder of synovium and tendon, unspecified site: Secondary | ICD-10-CM

## 2018-06-14 DIAGNOSIS — Z0000 Encounter for general adult medical examination without abnormal findings: Secondary | ICD-10-CM

## 2018-06-14 NOTE — Patient Instructions (Signed)
It was good to see you again today.  Sports medicine will call you in about a week to ultrasound your right arm.  If you have not heard from them by the middle of next week call and ask [place].  Work on cutting down on breads, rice, pastas ("white foods") to help with weight loss and start walking in the evenings.  We are checking labs and I will let you know those results.  -----------------------------------------------------------------------------------   Health Maintenance, Male A healthy lifestyle and preventive care is important for your health and wellness. Ask your health care provider about what schedule of regular examinations is right for you. What should I know about weight and diet? Eat a Healthy Diet  Eat plenty of vegetables, fruits, whole grains, low-fat dairy products, and lean protein.  Do not eat a lot of foods high in solid fats, added sugars, or salt.  Maintain a Healthy Weight Regular exercise can help you achieve or maintain a healthy weight. You should:  Do at least 150 minutes of exercise each week. The exercise should increase your heart rate and make you sweat (moderate-intensity exercise).  Do strength-training exercises at least twice a week.  Watch Your Levels of Cholesterol and Blood Lipids  Have your blood tested for lipids and cholesterol every 5 years starting at 62 years of age. If you are at high risk for heart disease, you should start having your blood tested when you are 62 years old. You may need to have your cholesterol levels checked more often if: ? Your lipid or cholesterol levels are high. ? You are older than 62 years of age. ? You are at high risk for heart disease.  What should I know about cancer screening? Many types of cancers can be detected early and may often be prevented. Lung Cancer  You should be screened every year for lung cancer if: ? You are a current smoker who has smoked for at least 30 years. ? You are a former  smoker who has quit within the past 15 years.  Talk to your health care provider about your screening options, when you should start screening, and how often you should be screened.  Colorectal Cancer  Routine colorectal cancer screening usually begins at 62 years of age and should be repeated every 5-10 years until you are 62 years old. You may need to be screened more often if early forms of precancerous polyps or small growths are found. Your health care provider may recommend screening at an earlier age if you have risk factors for colon cancer.  Your health care provider may recommend using home test kits to check for hidden blood in the stool.  A small camera at the end of a tube can be used to examine your colon (sigmoidoscopy or colonoscopy). This checks for the earliest forms of colorectal cancer.

## 2018-06-14 NOTE — Progress Notes (Signed)
[name] [name] is a 62 y.o. male who presents to [facility] today for comprehensive physical examination:  CPE  Concerns:  "bump" on right arm.  Present for past several months.  Nonpainful.  Can move it around on his arm.  Noticed during the shower.  Gradual weight gain.  No weight loss.  No night sweats.  No arm weakness.  Last physical last year  Tetanus up to date Flu vaccine up to date Dental exam every six months.   PMH reviewed. Patient is a nonsmoker.   Past Medical History:  Diagnosis Date  . GERD (gastroesophageal reflux disease)   . Hypertension   . Varicose veins    Past Surgical History:  Procedure Laterality Date  . ESOPHAGOGASTRIC FUNDOPLICATION  4098  . HERNIA REPAIR      Medications reviewed. Current Outpatient Medications  Medication Sig Dispense Refill  . doxycycline (VIBRAMYCIN) 100 MG capsule Take 1 capsule (100 mg total) by mouth 2 (two) times daily. Take with food. 20 capsule 0  . fish oil-omega-3 fatty acids 1000 MG capsule Take 2 g by mouth daily.      . hydrochlorothiazide (HYDRODIURIL) 25 MG tablet TAKE 1 TABLET BY MOUTH ONCE DAILY 90 tablet 3  . hydrOXYzine (ATARAX/VISTARIL) 25 MG tablet Take 1 pill as needed for situational anxiety 20 tablet 0  . pantoprazole (PROTONIX) 40 MG tablet TAKE 1 TABLET (40 MG TOTAL) BY MOUTH DAILY. 90 tablet 3  . traMADol (ULTRAM) 50 MG tablet Take by mouth every 6 (six) hours as needed.    . white petrolatum ointment Apply topically as needed for dry skin. 30 g 0   No current facility-administered medications for this visit.     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  HENT: Negative for congestion, ear discharge, ear pain and hearing loss.   Eyes: Negative for  blurred vision.  Respiratory: Negative for cough and wheezing.   Cardiovascular: Negative for chest pain, palpitations and leg swelling.  Gastrointestinal: Negative for nausea, vomiting and abdominal pain.  Genitourinary: Negative for dysuria, hematuria and flank pain.  Musculoskeletal: Negative for neck pain.  Skin: Negative for rash.  Neurological: Negative for dizziness and headaches.  Psychiatric/Behavioral: Negative for depression and suicidal ideas.   Exam: BP (!) 144/88   Pulse 77   Temp 98.5 F (36.9 C) (Oral)   Ht 6' (1.829 m)   Wt 252 lb 9.6 oz (114.6 kg)   SpO2 95%   BMI 34.26 kg/m  Gen:  Alert, cooperative patient who appears stated age in no acute distress.  Vital signs reviewed. Head: [place]/AT.   Eyes:  EOMI, PERRL.   Ears:  External ears WNL, Bilateral TM's normal without retraction, redness or bulging. Nose:  Septum midline  Mouth:  MMM, tonsils non-erythematous, non-edematous.   Neck: No masses or thyromegaly or limitation in range of motion.  No cervical lymphadenopathy. Pulm:  Clear to auscultation bilaterally with good air movement.  No wheezes or rales noted.   Cardiac:  Regular rate and rhythm without murmur auscultated.  Good S1/S2. MSK:  Right arm with nodule that appears to be part of triceps tendon.  Mobile.  Nonpainful.  No redness.   Abd:  Soft/nondistended/nontender.  Good bowel sounds throughout all four quadrants.  No masses noted.  Ext:  No clubbing/cyanosis/erythema.  No edema noted bilateral lower extremities.   Neuro:  Grossly normal, no gait abnormalities Psych:  Not depressed or anxious appearing.  Conversant and engaged  Impression/Plan: 1. Complete Physical Examination: anticipatory guidance provided. Discussed weight issues.  Will cut back on carbohydrates and increase walking in evenings.   2..  Vaccines:  Up to date. 3.  Screening cholesterol: obtain FLP. 4.  Nodule Right arm:  Sports med for U/S.  Feel connected to tendon.  Not typical  sebaceous cyst or lipoma.   5.  Weight loss: as above.

## 2018-06-15 LAB — LIPID PANEL
CHOLESTEROL TOTAL: 119 mg/dL (ref 100–199)
Chol/HDL Ratio: 5.2 ratio — ABNORMAL HIGH (ref 0.0–5.0)
HDL: 23 mg/dL — ABNORMAL LOW (ref >39)
LDL Calculated: 45 mg/dL (ref 0–99)
Triglycerides: 257 mg/dL — ABNORMAL HIGH (ref 0–149)
VLDL Cholesterol Cal: 51 mg/dL — ABNORMAL HIGH (ref 5–40)

## 2018-06-15 LAB — CBC
Hematocrit: 46.1 % (ref 37.5–51.0)
Hemoglobin: 16.2 g/dL (ref 13.0–17.7)
MCH: 29.1 pg (ref 26.6–33.0)
MCHC: 35.1 g/dL (ref 31.5–35.7)
MCV: 83 fL (ref 79–97)
Platelets: 166 10*3/uL (ref 150–450)
RBC: 5.56 x10E6/uL (ref 4.14–5.80)
RDW: 14.8 % (ref 12.3–15.4)
WBC: 4.9 10*3/uL (ref 3.4–10.8)

## 2018-06-15 LAB — COMPREHENSIVE METABOLIC PANEL
A/G RATIO: 1.7 (ref 1.2–2.2)
ALT: 55 IU/L — ABNORMAL HIGH (ref 0–44)
AST: 42 IU/L — AB (ref 0–40)
Albumin: 4.3 g/dL (ref 3.6–4.8)
Alkaline Phosphatase: 80 IU/L (ref 39–117)
BUN/Creatinine Ratio: 21 (ref 10–24)
BUN: 17 mg/dL (ref 8–27)
Bilirubin Total: 1.2 mg/dL (ref 0.0–1.2)
CALCIUM: 9.6 mg/dL (ref 8.6–10.2)
CO2: 22 mmol/L (ref 20–29)
Chloride: 103 mmol/L (ref 96–106)
Creatinine, Ser: 0.8 mg/dL (ref 0.76–1.27)
GFR, EST AFRICAN AMERICAN: 111 mL/min/{1.73_m2} (ref >59)
GFR, EST NON AFRICAN AMERICAN: 96 mL/min/{1.73_m2} (ref >59)
GLOBULIN, TOTAL: 2.6 g/dL (ref 1.5–4.5)
Glucose: 96 mg/dL (ref 65–99)
POTASSIUM: 4.1 mmol/L (ref 3.5–5.2)
SODIUM: 140 mmol/L (ref 134–144)
TOTAL PROTEIN: 6.9 g/dL (ref 6.0–8.5)

## 2018-06-20 DIAGNOSIS — M62838 Other muscle spasm: Secondary | ICD-10-CM | POA: Diagnosis not present

## 2018-06-20 DIAGNOSIS — M9902 Segmental and somatic dysfunction of thoracic region: Secondary | ICD-10-CM | POA: Diagnosis not present

## 2018-06-20 DIAGNOSIS — M546 Pain in thoracic spine: Secondary | ICD-10-CM | POA: Diagnosis not present

## 2018-07-04 ENCOUNTER — Encounter: Payer: Self-pay | Admitting: Family Medicine

## 2018-07-04 DIAGNOSIS — R2233 Localized swelling, mass and lump, upper limb, bilateral: Secondary | ICD-10-CM

## 2018-07-11 ENCOUNTER — Ambulatory Visit: Payer: 59 | Admitting: Sports Medicine

## 2018-07-11 ENCOUNTER — Encounter: Payer: Self-pay | Admitting: Sports Medicine

## 2018-07-11 VITALS — BP 138/90 | Ht 72.0 in | Wt 245.0 lb

## 2018-07-11 DIAGNOSIS — D1721 Benign lipomatous neoplasm of skin and subcutaneous tissue of right arm: Secondary | ICD-10-CM | POA: Diagnosis not present

## 2018-07-11 NOTE — Progress Notes (Signed)
   Subjective:    Patient ID: [name] [name], male    DOB: [birth year], 62 y.o.   MRN: [id number]  HPI chief complaint: "Right arm knot"  Very pleasant 62 year old right-hand-dominant male comes in today complaining of 4 months of a bump near his right triceps. He denies any trauma. He first noticed the area while showering. The area has not increased in size and he thinks that it may have actually gotten a little bit smaller. He brought this to the attention of his PCP who referred him to [place] today for ultrasound evaluation. The area is not tender. He's not had any imaging.  Past medical history reviewed Medications reviewed Allergies reviewed    Review of Systems As above    Objective:   Physical Exam  Well-developed, well-nourished. No acute distress. Awake alert and oriented 3. Vital signs reviewed  Right arm: There is a small palpable mass located between the lateral head and long head of the triceps muscle. Mass is non-fluctuant. Slightly mobile. Non-tender to palpation. Patient has full range of motion at the elbow. Full strength. Neurovascularly intact distally.  Bedside MSK of the upper right arm was performed. Limited images were obtained. There is a well-circumscribed mass that appears to reside in the intermuscular region between the lateral head and long heads of the triceps muscles. It is relatively hyperechoic and appears to be a small lipoma. It is not cystic.      Assessment & Plan:   Intermuscular lipoma, right arm  Reassurance is given. Patient believes the lump is getting smaller. Would recommend no further workup or intervention at this time. However, if the mass increases in size or becomes more symptomatic then MRI with and without contrast should be considered. Follow-up as needed.

## 2018-08-15 MED FILL — HYDROCHLOROTHIAZIDE 25 MG T: 25 | 90 days supply | Qty: 90 | Fill #3

## 2018-08-18 DIAGNOSIS — M25511 Pain in right shoulder: Secondary | ICD-10-CM | POA: Diagnosis not present

## 2018-08-18 DIAGNOSIS — M9901 Segmental and somatic dysfunction of cervical region: Secondary | ICD-10-CM | POA: Diagnosis not present

## 2018-08-18 DIAGNOSIS — M25531 Pain in right wrist: Secondary | ICD-10-CM | POA: Diagnosis not present

## 2018-08-18 DIAGNOSIS — M9904 Segmental and somatic dysfunction of sacral region: Secondary | ICD-10-CM | POA: Diagnosis not present

## 2018-09-02 ENCOUNTER — Other Ambulatory Visit: Payer: Self-pay

## 2018-09-02 ENCOUNTER — Other Ambulatory Visit: Payer: Self-pay | Admitting: Family Medicine

## 2018-09-02 MED ORDER — HYDROXYZINE HCL 25 MG PO TABS: ORAL_TABLET | ORAL | 0 refills | Status: DC

## 2018-09-02 MED FILL — hydrOXYzine HCL 25 MG TABS: 25 | 20 days supply | Qty: 20 | Fill #0

## 2018-09-02 NOTE — Telephone Encounter (Signed)
[facility] sent fax that Rx for Hydroxyzine directions must have a frequency for PRN medications such as daily as needed or BID as needed.  Please send new Rx.  Danley Danker, RN [facility] RN)

## 2018-09-05 MED ORDER — HYDROXYZINE HCL 25 MG PO TABS: 25.0000 mg | ORAL_TABLET | Freq: Every day | ORAL | 0 refills | Status: DC | PRN

## 2018-09-29 MED FILL — PANTOPRAZOLE SOD DR 40 MG T: 40 | 90 days supply | Qty: 90 | Fill #2

## 2018-11-24 ENCOUNTER — Other Ambulatory Visit: Payer: Self-pay | Admitting: Family Medicine

## 2018-11-28 MED FILL — HYDROCHLOROTHIAZIDE 25 MG T: 25 | 90 days supply | Qty: 90 | Fill #0

## 2019-01-19 DIAGNOSIS — M9902 Segmental and somatic dysfunction of thoracic region: Secondary | ICD-10-CM | POA: Diagnosis not present

## 2019-01-19 DIAGNOSIS — M25531 Pain in right wrist: Secondary | ICD-10-CM | POA: Diagnosis not present

## 2019-01-19 DIAGNOSIS — M9901 Segmental and somatic dysfunction of cervical region: Secondary | ICD-10-CM | POA: Diagnosis not present

## 2019-01-19 DIAGNOSIS — M25511 Pain in right shoulder: Secondary | ICD-10-CM | POA: Diagnosis not present

## 2019-01-23 DIAGNOSIS — M9902 Segmental and somatic dysfunction of thoracic region: Secondary | ICD-10-CM | POA: Diagnosis not present

## 2019-01-23 DIAGNOSIS — M25531 Pain in right wrist: Secondary | ICD-10-CM | POA: Diagnosis not present

## 2019-01-23 DIAGNOSIS — M9901 Segmental and somatic dysfunction of cervical region: Secondary | ICD-10-CM | POA: Diagnosis not present

## 2019-01-23 DIAGNOSIS — M25511 Pain in right shoulder: Secondary | ICD-10-CM | POA: Diagnosis not present

## 2019-01-26 DIAGNOSIS — M25511 Pain in right shoulder: Secondary | ICD-10-CM | POA: Diagnosis not present

## 2019-01-26 DIAGNOSIS — M9901 Segmental and somatic dysfunction of cervical region: Secondary | ICD-10-CM | POA: Diagnosis not present

## 2019-01-26 DIAGNOSIS — M25531 Pain in right wrist: Secondary | ICD-10-CM | POA: Diagnosis not present

## 2019-01-26 DIAGNOSIS — M9902 Segmental and somatic dysfunction of thoracic region: Secondary | ICD-10-CM | POA: Diagnosis not present

## 2019-01-30 DIAGNOSIS — M25511 Pain in right shoulder: Secondary | ICD-10-CM | POA: Diagnosis not present

## 2019-01-30 DIAGNOSIS — M9902 Segmental and somatic dysfunction of thoracic region: Secondary | ICD-10-CM | POA: Diagnosis not present

## 2019-01-30 DIAGNOSIS — M9901 Segmental and somatic dysfunction of cervical region: Secondary | ICD-10-CM | POA: Diagnosis not present

## 2019-01-30 DIAGNOSIS — M25531 Pain in right wrist: Secondary | ICD-10-CM | POA: Diagnosis not present

## 2019-02-02 DIAGNOSIS — M9902 Segmental and somatic dysfunction of thoracic region: Secondary | ICD-10-CM | POA: Diagnosis not present

## 2019-02-02 DIAGNOSIS — M9901 Segmental and somatic dysfunction of cervical region: Secondary | ICD-10-CM | POA: Diagnosis not present

## 2019-02-02 DIAGNOSIS — M25511 Pain in right shoulder: Secondary | ICD-10-CM | POA: Diagnosis not present

## 2019-02-02 DIAGNOSIS — M25531 Pain in right wrist: Secondary | ICD-10-CM | POA: Diagnosis not present

## 2019-02-06 DIAGNOSIS — M9902 Segmental and somatic dysfunction of thoracic region: Secondary | ICD-10-CM | POA: Diagnosis not present

## 2019-02-06 DIAGNOSIS — M9901 Segmental and somatic dysfunction of cervical region: Secondary | ICD-10-CM | POA: Diagnosis not present

## 2019-02-06 DIAGNOSIS — M25531 Pain in right wrist: Secondary | ICD-10-CM | POA: Diagnosis not present

## 2019-02-06 DIAGNOSIS — M25511 Pain in right shoulder: Secondary | ICD-10-CM | POA: Diagnosis not present

## 2019-02-16 MED FILL — PANTOPRAZOLE SOD DR 40 MG T: 40 | 90 days supply | Qty: 90 | Fill #3

## 2019-02-16 MED FILL — HYDROCHLOROTHIAZIDE 25 MG T: 25 | 90 days supply | Qty: 90 | Fill #1

## 2019-05-16 ENCOUNTER — Other Ambulatory Visit: Payer: Self-pay | Admitting: Family Medicine

## 2019-05-16 MED FILL — HYDROCHLOROTHIAZIDE 25 MG T: 25 | 90 days supply | Qty: 90 | Fill #2

## 2019-05-17 DIAGNOSIS — M9903 Segmental and somatic dysfunction of lumbar region: Secondary | ICD-10-CM | POA: Diagnosis not present

## 2019-05-17 DIAGNOSIS — M545 Low back pain: Secondary | ICD-10-CM | POA: Diagnosis not present

## 2019-05-17 DIAGNOSIS — M9905 Segmental and somatic dysfunction of pelvic region: Secondary | ICD-10-CM | POA: Diagnosis not present

## 2019-05-17 DIAGNOSIS — M9904 Segmental and somatic dysfunction of sacral region: Secondary | ICD-10-CM | POA: Diagnosis not present

## 2019-05-17 MED FILL — PANTOPRAZOLE SOD DR 40 MG T: 40 | 90 days supply | Qty: 90 | Fill #0

## 2019-05-22 DIAGNOSIS — M545 Low back pain: Secondary | ICD-10-CM | POA: Diagnosis not present

## 2019-05-22 DIAGNOSIS — M9904 Segmental and somatic dysfunction of sacral region: Secondary | ICD-10-CM | POA: Diagnosis not present

## 2019-05-22 DIAGNOSIS — M9903 Segmental and somatic dysfunction of lumbar region: Secondary | ICD-10-CM | POA: Diagnosis not present

## 2019-05-22 DIAGNOSIS — M9905 Segmental and somatic dysfunction of pelvic region: Secondary | ICD-10-CM | POA: Diagnosis not present

## 2019-06-20 ENCOUNTER — Other Ambulatory Visit: Payer: Self-pay

## 2019-06-20 ENCOUNTER — Ambulatory Visit (INDEPENDENT_AMBULATORY_CARE_PROVIDER_SITE_OTHER): Payer: 59 | Admitting: Family Medicine

## 2019-06-20 ENCOUNTER — Encounter: Payer: Self-pay | Admitting: Family Medicine

## 2019-06-20 VITALS — BP 144/88 | HR 71 | Ht 72.0 in | Wt 236.2 lb

## 2019-06-20 DIAGNOSIS — R1032 Left lower quadrant pain: Secondary | ICD-10-CM

## 2019-06-20 DIAGNOSIS — Z125 Encounter for screening for malignant neoplasm of prostate: Secondary | ICD-10-CM

## 2019-06-20 DIAGNOSIS — R109 Unspecified abdominal pain: Secondary | ICD-10-CM | POA: Insufficient documentation

## 2019-06-20 DIAGNOSIS — K219 Gastro-esophageal reflux disease without esophagitis: Secondary | ICD-10-CM

## 2019-06-20 DIAGNOSIS — I10 Essential (primary) hypertension: Secondary | ICD-10-CM

## 2019-06-20 DIAGNOSIS — E785 Hyperlipidemia, unspecified: Secondary | ICD-10-CM | POA: Diagnosis not present

## 2019-06-20 HISTORY — DX: Unspecified abdominal pain: R10.9

## 2019-06-20 NOTE — Patient Instructions (Signed)
Good to see you today!  Thanks for coming in.  I will call you if your tests are not good.  Otherwise I will send you a letter.  If you do not hear from me with in 2 weeks please call our office.     Contact [facility] about your next colonoscopy   Consider Exercise every day -walking Continue the weight loss and diet modification - consider a goal to achieve   Check your blood pressure 2-3 times a week Your goal blood pressure should be < 140/90 If regularly greater then call us  Take the protonix as needed not every day

## 2019-06-20 NOTE — Assessment & Plan Note (Signed)
Given duration and mildness does not sound concerning.  Does need colonoscopy so asked him to make an appointment.   Discussed need to contact us if worsening or interfering with function

## 2019-06-20 NOTE — Assessment & Plan Note (Signed)
BP Readings from Last 3 Encounters:  06/20/19 (!) 144/88  07/11/18 138/90  06/14/18 (!) 144/88   Not at goal today but did not take medications this AM.  Will monitor at home

## 2019-06-20 NOTE — Assessment & Plan Note (Signed)
LDLs consistently in 50s without medications.

## 2019-06-20 NOTE — Progress Notes (Signed)
Subjective  [name] [name] is a 63 y.o. male is presenting for a check up.  Only concern is   ABDOMEN PAIN Intermittent LLQ pain for at least a year.  Not related to anything.  Mainly tender when he pushes on it.  Not worsening.  No nausea and vomiting diarrhea or bleeding or fever.  Patient reports no  vision/ hearing changes,anorexia, weight change, fever ,adenopathy, persistant / recurrent hoarseness, swallowing issues, chest pain, edema,persistant / recurrent cough, hemoptysis, dyspnea(rest, exertional, paroxysmal nocturnal), gastrointestinal  bleeding (melena, rectal bleeding), abdominal pain, excessive heart burn, GU symptoms(dysuria, hematuria, pyuria, voiding/incontinence  Issues) syncope, focal weakness, severe memory loss, concerning skin lesions, depression, anxiety, abnormal bruising/bleeding, major joint swelling.    Chief Complaint noted Review of Symptoms - see HPI PMH - Smoking status noted.    Objective Vital Signs reviewed BP (!) 144/88   Pulse 71   Ht 6' (1.829 m)   Wt 236 lb 3.2 oz (107.1 kg)   SpO2 98%   BMI 32.03 kg/m  Heart - Regular rate and rhythm.  No murmurs, gallops or rubs.    Lungs:  Normal respiratory effort, chest expands symmetrically. Lungs are clear to auscultation, no crackles or wheezes. Neck:  No deformities, thyromegaly, masses, or tenderness noted.   Supple with full range of motion without pain. ,Abdomen: soft and mildly tender to deep palpation on LLQ without masses, organomegaly or hernias noted.  No guarding or rebound Skin:  Intact without suspicious lesions or rashes.  Numerous benign moles SKs on back Extremities:  No cyanosis, edema, or deformity noted with good range of motion of all major joints.    Assessments/Plans  See after visit summary for details of patient instuctions  GERD (gastroesophageal reflux disease) Suggested taking PPI as needed   HLD (hyperlipidemia) LDLs consistently in 50s without medications.    HTN  (hypertension) BP Readings from Last 3 Encounters:  06/20/19 (!) 144/88  07/11/18 138/90  06/14/18 (!) 144/88   Not at goal today but did not take medications this AM.  Will monitor at home   Abdominal pain Given duration and mildness does not sound concerning.  Does need colonoscopy so asked him to make an appointment.   Discussed need to contact us if worsening or interfering with function

## 2019-06-20 NOTE — Assessment & Plan Note (Signed)
Suggested taking PPI as needed

## 2019-06-21 ENCOUNTER — Encounter: Payer: Self-pay | Admitting: Family Medicine

## 2019-06-21 LAB — CMP14+EGFR
ALT: 27 IU/L (ref 0–44)
AST: 21 IU/L (ref 0–40)
Albumin/Globulin Ratio: 1.7 (ref 1.2–2.2)
Albumin: 4.3 g/dL (ref 3.8–4.8)
Alkaline Phosphatase: 79 IU/L (ref 39–117)
BUN/Creatinine Ratio: 19 (ref 10–24)
BUN: 18 mg/dL (ref 8–27)
Bilirubin Total: 0.9 mg/dL (ref 0.0–1.2)
CO2: 26 mmol/L (ref 20–29)
Calcium: 9.6 mg/dL (ref 8.6–10.2)
Chloride: 104 mmol/L (ref 96–106)
Creatinine, Ser: 0.94 mg/dL (ref 0.76–1.27)
GFR calc Af Amer: 100 mL/min/{1.73_m2} (ref >59)
GFR calc non Af Amer: 87 mL/min/{1.73_m2} (ref >59)
Globulin, Total: 2.6 g/dL (ref 1.5–4.5)
Glucose: 93 mg/dL (ref 65–99)
Potassium: 4.3 mmol/L (ref 3.5–5.2)
Sodium: 144 mmol/L (ref 134–144)
Total Protein: 6.9 g/dL (ref 6.0–8.5)

## 2019-06-21 LAB — PSA: Prostate Specific Ag, Serum: 1.2 ng/mL (ref 0.0–4.0)

## 2019-07-31 DIAGNOSIS — M9903 Segmental and somatic dysfunction of lumbar region: Secondary | ICD-10-CM | POA: Diagnosis not present

## 2019-07-31 DIAGNOSIS — M9905 Segmental and somatic dysfunction of pelvic region: Secondary | ICD-10-CM | POA: Diagnosis not present

## 2019-07-31 DIAGNOSIS — M9904 Segmental and somatic dysfunction of sacral region: Secondary | ICD-10-CM | POA: Diagnosis not present

## 2019-07-31 DIAGNOSIS — M545 Low back pain: Secondary | ICD-10-CM | POA: Diagnosis not present

## 2019-08-03 DIAGNOSIS — M9904 Segmental and somatic dysfunction of sacral region: Secondary | ICD-10-CM | POA: Diagnosis not present

## 2019-08-03 DIAGNOSIS — M9903 Segmental and somatic dysfunction of lumbar region: Secondary | ICD-10-CM | POA: Diagnosis not present

## 2019-08-03 DIAGNOSIS — M545 Low back pain: Secondary | ICD-10-CM | POA: Diagnosis not present

## 2019-08-03 DIAGNOSIS — M9905 Segmental and somatic dysfunction of pelvic region: Secondary | ICD-10-CM | POA: Diagnosis not present

## 2019-08-29 MED FILL — HYDROCHLOROTHIAZIDE 25 MG T: 25 | 90 days supply | Qty: 90 | Fill #3

## 2019-09-27 ENCOUNTER — Encounter: Payer: Self-pay | Admitting: Gastroenterology

## 2019-11-30 MED FILL — PANTOPRAZOLE SOD DR 40 MG T: 40 | 90 days supply | Qty: 90 | Fill #1

## 2019-12-01 ENCOUNTER — Other Ambulatory Visit: Payer: Self-pay

## 2019-12-01 MED ORDER — HYDROCHLOROTHIAZIDE 25 MG PO TABS
25.0000 mg | ORAL_TABLET | Freq: Every day | ORAL | 3 refills | Status: DC
Start: 2019-12-01 — End: 2020-12-25

## 2019-12-01 MED FILL — HYDROCHLOROTHIAZIDE 25 MG T: 25 | 90 days supply | Qty: 90 | Fill #0

## 2019-12-06 DIAGNOSIS — M9901 Segmental and somatic dysfunction of cervical region: Secondary | ICD-10-CM | POA: Diagnosis not present

## 2019-12-06 DIAGNOSIS — M9902 Segmental and somatic dysfunction of thoracic region: Secondary | ICD-10-CM | POA: Diagnosis not present

## 2019-12-06 DIAGNOSIS — M25531 Pain in right wrist: Secondary | ICD-10-CM | POA: Diagnosis not present

## 2019-12-06 DIAGNOSIS — M25511 Pain in right shoulder: Secondary | ICD-10-CM | POA: Diagnosis not present

## 2020-01-09 ENCOUNTER — Telehealth: Payer: Self-pay

## 2020-01-09 NOTE — Telephone Encounter (Signed)
Pt calls nurse line regarding + covid results. Symptoms began Friday 01/05/20 evening and consisted of runny nose and slight fever. Patient received positive COVID tests today. Per patient only symptoms continue to be runny nose. Pt has been treating symptoms with Tylenol cold and flu. Pt denies SHOB, chest pain, and fever. Advised patient to continue with symptom management and if symptoms worsen to call office back for virtual visit or for severe symptoms (SHOB, difficulty breathing or Chest pain) go immediately to ED.   Pt verbalizes understanding  Talbot Grumbling, RN

## 2020-02-28 DIAGNOSIS — M546 Pain in thoracic spine: Secondary | ICD-10-CM | POA: Diagnosis not present

## 2020-02-28 DIAGNOSIS — M62838 Other muscle spasm: Secondary | ICD-10-CM | POA: Diagnosis not present

## 2020-02-28 DIAGNOSIS — M9902 Segmental and somatic dysfunction of thoracic region: Secondary | ICD-10-CM | POA: Diagnosis not present

## 2020-03-06 DIAGNOSIS — M546 Pain in thoracic spine: Secondary | ICD-10-CM | POA: Diagnosis not present

## 2020-03-06 DIAGNOSIS — M9902 Segmental and somatic dysfunction of thoracic region: Secondary | ICD-10-CM | POA: Diagnosis not present

## 2020-03-06 DIAGNOSIS — M62838 Other muscle spasm: Secondary | ICD-10-CM | POA: Diagnosis not present

## 2020-03-07 MED FILL — HYDROCHLOROTHIAZIDE 25 MG T: 25 | 90 days supply | Qty: 90 | Fill #1

## 2020-03-13 DIAGNOSIS — M546 Pain in thoracic spine: Secondary | ICD-10-CM | POA: Diagnosis not present

## 2020-03-13 DIAGNOSIS — M9902 Segmental and somatic dysfunction of thoracic region: Secondary | ICD-10-CM | POA: Diagnosis not present

## 2020-03-13 DIAGNOSIS — M62838 Other muscle spasm: Secondary | ICD-10-CM | POA: Diagnosis not present

## 2020-03-20 DIAGNOSIS — M546 Pain in thoracic spine: Secondary | ICD-10-CM | POA: Diagnosis not present

## 2020-03-20 DIAGNOSIS — M9902 Segmental and somatic dysfunction of thoracic region: Secondary | ICD-10-CM | POA: Diagnosis not present

## 2020-03-20 DIAGNOSIS — M62838 Other muscle spasm: Secondary | ICD-10-CM | POA: Diagnosis not present

## 2020-04-18 ENCOUNTER — Other Ambulatory Visit: Payer: Self-pay | Admitting: Family Medicine

## 2020-04-18 MED FILL — hydrOXYzine HCL 25 MG TABS: 25 | 20 days supply | Qty: 20 | Fill #0

## 2020-06-12 MED FILL — HYDROCHLOROTHIAZIDE 25 MG T: 25 | 90 days supply | Qty: 90 | Fill #2

## 2020-07-02 ENCOUNTER — Ambulatory Visit: Payer: 59 | Admitting: Family Medicine

## 2020-07-02 ENCOUNTER — Other Ambulatory Visit: Payer: Self-pay

## 2020-07-02 VITALS — BP 130/98 | HR 81 | Wt 242.2 lb

## 2020-07-02 DIAGNOSIS — R1032 Left lower quadrant pain: Secondary | ICD-10-CM

## 2020-07-02 DIAGNOSIS — Z1211 Encounter for screening for malignant neoplasm of colon: Secondary | ICD-10-CM

## 2020-07-02 DIAGNOSIS — K219 Gastro-esophageal reflux disease without esophagitis: Secondary | ICD-10-CM | POA: Diagnosis not present

## 2020-07-02 DIAGNOSIS — Z125 Encounter for screening for malignant neoplasm of prostate: Secondary | ICD-10-CM | POA: Diagnosis not present

## 2020-07-02 DIAGNOSIS — I10 Essential (primary) hypertension: Secondary | ICD-10-CM

## 2020-07-02 DIAGNOSIS — E669 Obesity, unspecified: Secondary | ICD-10-CM | POA: Diagnosis not present

## 2020-07-02 NOTE — Assessment & Plan Note (Signed)
Discussed exercise and diet modification

## 2020-07-02 NOTE — Progress Notes (Signed)
    SUBJECTIVE:   CHIEF COMPLAINT / HPI:   Here for Physical overall feels well  Issues  Abdomen pain - still has mild intermittent lower abdomen pain - sore when touched.  No vomiting or diarrhea or bleeding or weight loss  Obesity Has gained more weight with not exercising and worsening food choices.  Was able to lose weight when preparing for a [place] trip in the past  GERD -takes protonix as needed when anticipates reflux   Patient reports no  vision/ hearing changes,anorexia,e, fever ,adenopathy, persistant / recurrent hoarseness, swallowing issues, chest pain, edema,persistant / recurrent cough, hemoptysis, dyspnea(rest, exertional, paroxysmal nocturnal), gastrointestinal  bleeding (melena, rectal bleeding),  excessive heart burn, GU symptoms(dysuria, hematuria, pyuria, voiding/incontinence  Issues) syncope, focal weakness, severe memory loss, concerning skin lesions, depression, anxiety, abnormal bruising/bleeding, major joint swelling.     PERTINENT  PMH / PSH: mother died of covid  OBJECTIVE:   BP (!) 130/98   Pulse 81   Wt 242 lb 3.2 oz (109.9 kg)   SpO2 98%   BMI 32.85 kg/m   Heart - Regular rate and rhythm.  No murmurs, gallops or rubs.    Lungs:  Normal respiratory effort, chest expands symmetrically. Lungs are clear to auscultation, no crackles or wheezes. Abdomen: soft and  without masses, organomegaly or hernias noted.  No guarding or rebound.  Mild tenderness mild lower abdomen with palpation.  Resolves when tightens muscles  Extremities:  No cyanosis, edema, or deformity noted with good range of motion of all major joints.   Skin:  Intact without suspicious lesions or rashes   ASSESSMENT/PLAN:   HTN (hypertension) Not at goal.  Is taking his medications.  Return in 1 mo with home readings   GERD (gastroesophageal reflux disease) Stable symptoms  Abdominal pain Persistent without concerning features.  Is due to colon ca screening   Obesity (BMI  30-39.9) Discussed exercise and diet modification      Lind Covert, MD [place]

## 2020-07-02 NOTE — Assessment & Plan Note (Signed)
Stable symptoms

## 2020-07-02 NOTE — Patient Instructions (Addendum)
Good to see you today!  Thanks for coming in.  Abdomen Pain - you need to get your colonoscopy in the next 2-3 months - if worsening or any bleeding let me know  Hypertension - check your blood pressure at home.  Should most of the time be less than 140/90.   - Bring in your blood pressure cuff and readings at your next visit  Weight - you are right losing weight would help your health a great deal - Pretend you are going to [place]!    Come back in one month to check your blood pressure and weight  I will call you if your lab tests are not normal.  Otherwise we will discuss them at your next visit.

## 2020-07-02 NOTE — Assessment & Plan Note (Signed)
Not at goal.  Is taking his medications.  Return in 1 mo with home readings

## 2020-07-02 NOTE — Assessment & Plan Note (Signed)
Persistent without concerning features.  Is due to colon ca screening

## 2020-07-03 LAB — CMP14+EGFR
ALT: 37 IU/L (ref 0–44)
AST: 35 IU/L (ref 0–40)
Albumin/Globulin Ratio: 1.6 (ref 1.2–2.2)
Albumin: 4.2 g/dL (ref 3.8–4.8)
Alkaline Phosphatase: 77 IU/L (ref 48–121)
BUN/Creatinine Ratio: 15 (ref 10–24)
BUN: 15 mg/dL (ref 8–27)
Bilirubin Total: 0.9 mg/dL (ref 0.0–1.2)
CO2: 26 mmol/L (ref 20–29)
Calcium: 9.4 mg/dL (ref 8.6–10.2)
Chloride: 103 mmol/L (ref 96–106)
Creatinine, Ser: 1 mg/dL (ref 0.76–1.27)
GFR calc Af Amer: 92 mL/min/{1.73_m2} (ref >59)
GFR calc non Af Amer: 80 mL/min/{1.73_m2} (ref >59)
Globulin, Total: 2.6 g/dL (ref 1.5–4.5)
Glucose: 103 mg/dL — ABNORMAL HIGH (ref 65–99)
Potassium: 4 mmol/L (ref 3.5–5.2)
Sodium: 141 mmol/L (ref 134–144)
Total Protein: 6.8 g/dL (ref 6.0–8.5)

## 2020-07-03 LAB — PSA: Prostate Specific Ag, Serum: 1.3 ng/mL (ref 0.0–4.0)

## 2020-07-09 ENCOUNTER — Ambulatory Visit: Payer: 59 | Admitting: Family Medicine

## 2020-08-06 ENCOUNTER — Ambulatory Visit (INDEPENDENT_AMBULATORY_CARE_PROVIDER_SITE_OTHER): Payer: 59 | Admitting: Family Medicine

## 2020-08-06 ENCOUNTER — Other Ambulatory Visit: Payer: Self-pay

## 2020-08-06 ENCOUNTER — Encounter: Payer: Self-pay | Admitting: Family Medicine

## 2020-08-06 DIAGNOSIS — E669 Obesity, unspecified: Secondary | ICD-10-CM | POA: Diagnosis not present

## 2020-08-06 DIAGNOSIS — I10 Essential (primary) hypertension: Secondary | ICD-10-CM | POA: Diagnosis not present

## 2020-08-06 DIAGNOSIS — R109 Unspecified abdominal pain: Secondary | ICD-10-CM

## 2020-08-06 MED ORDER — BLOOD PRESSURE CUFF MISC: 0 refills | Status: DC

## 2020-08-06 MED FILL — OMRON 3 SERIES BP MONITOR D: 1 days supply | Qty: 1 | Fill #0

## 2020-08-06 NOTE — Progress Notes (Signed)
° ° °  SUBJECTIVE:   CHIEF COMPLAINT / HPI:   HYPERTENSION Home BP Monitoring readings: his readings are usually in 140s/mid 90s.  His blood pressure cuff readings about 12 patients higher than ours today in both systolic and diastolic  Chest pain- no     Medication Adherence  Knows HCTZ. Lightheadedness-  no  Edema- no  Abdomen pain - unchanged.  No nausea and vomiting or severe pain. Does not interfere with his life.  Had one episode of hemmrhoidal bleeding that stopped.  Has not heard from GI about a colonoscpy  Obesity - has been working extra hours which makes execise and eating smaller sizes harder.  PERTINENT  PMH / PSH: Mother died 38 months ago.  Doing ok  Sister and parents both had diabetes   OBJECTIVE:   BP 128/82    Pulse 83    Wt 243 lb 6.4 oz (110.4 kg)    SpO2 94%    BMI 33.01 kg/m   Psych:  Cognition and judgment appear intact. Alert, communicative  and cooperative with normal attention span and concentration. No apparent delusions, illusions, hallucinations   ASSESSMENT/PLAN:   HTN (hypertension) Controlled by our cuff readings in office.  Have him get a new cuff and monitor.. Continue his hctz  Abdominal pain Stable refer to GI for colonoscopy  Obesity (BMI 30-39.9) Unchanged.  Discussed approaches and offered nutrition but he is not interested currently.  Strong fhx of diabetes which motivates him      Lind Covert, [place]

## 2020-08-06 NOTE — Assessment & Plan Note (Signed)
Controlled by our cuff readings in office.  Have him get a new cuff and monitor.. Continue his hctz

## 2020-08-06 NOTE — Assessment & Plan Note (Addendum)
Unchanged.  Discussed approaches and offered nutrition but he is not interested currently.  Strong fhx of diabetes which motivates him

## 2020-08-06 NOTE — Patient Instructions (Addendum)
Good to see you today - Thanks for coming in  For the Colonoscopy - call Main Line: [phone]  Continue to work on portion size and exercise for weight control  Monitor your blood pressure with a new cuff.  If regularly > 140/90 then let me now  If the abdomen pain worsens or changes let me know  If the sleep problems continue try going without a nap.     I sent a request for a blood pressure cuff to your pharmacy.  Let me know if you need anything else   Come back to see me in 6 months

## 2020-08-06 NOTE — Assessment & Plan Note (Signed)
Stable refer to GI for colonoscopy

## 2020-09-09 ENCOUNTER — Encounter: Payer: Self-pay | Admitting: Gastroenterology

## 2020-09-09 ENCOUNTER — Other Ambulatory Visit: Payer: Self-pay | Admitting: Family Medicine

## 2020-09-09 MED FILL — PANTOPRAZOLE SOD DR 40 MG T: 40 | 90 days supply | Qty: 90 | Fill #0

## 2020-09-09 MED FILL — HYDROCHLOROTHIAZIDE 25 MG T: 25 | 90 days supply | Qty: 90 | Fill #3

## 2020-10-25 ENCOUNTER — Other Ambulatory Visit: Payer: Self-pay

## 2020-10-25 ENCOUNTER — Other Ambulatory Visit: Payer: Self-pay | Admitting: Gastroenterology

## 2020-10-25 ENCOUNTER — Ambulatory Visit (AMBULATORY_SURGERY_CENTER): Payer: Self-pay

## 2020-10-25 VITALS — Ht 72.0 in | Wt 249.0 lb

## 2020-10-25 DIAGNOSIS — Z1211 Encounter for screening for malignant neoplasm of colon: Secondary | ICD-10-CM

## 2020-10-25 MED ORDER — NA SULFATE-K SULFATE-MG SULF 17.5-3.13-1.6 GM/177ML PO SOLN: 1.0000 | Freq: Once | ORAL | 0 refills | Status: DC

## 2020-10-25 MED FILL — SUPREP BOWEL PREP KIT: 17.5-3.13-1 | 1 days supply | Qty: 354 | Fill #0

## 2020-10-25 NOTE — Progress Notes (Signed)
No allergies to soy or egg Pt is not on blood thinners or diet pills Denies issues with sedation/intubation Denies atrial flutter/fib Denies constipation   Emmi instructions given to pt  Pt is aware of Covid safety and care partner requirements.  

## 2020-10-28 ENCOUNTER — Encounter: Payer: Self-pay | Admitting: Gastroenterology

## 2020-11-06 ENCOUNTER — Ambulatory Visit (INDEPENDENT_AMBULATORY_CARE_PROVIDER_SITE_OTHER): Payer: 59 | Admitting: Family Medicine

## 2020-11-06 ENCOUNTER — Other Ambulatory Visit: Payer: Self-pay

## 2020-11-06 ENCOUNTER — Encounter: Payer: Self-pay | Admitting: Family Medicine

## 2020-11-06 VITALS — BP 122/80 | HR 100 | Ht 72.0 in | Wt 251.5 lb

## 2020-11-06 DIAGNOSIS — R590 Localized enlarged lymph nodes: Secondary | ICD-10-CM | POA: Diagnosis not present

## 2020-11-06 DIAGNOSIS — Z23 Encounter for immunization: Secondary | ICD-10-CM | POA: Diagnosis not present

## 2020-11-06 NOTE — Patient Instructions (Signed)
I agree with everyone that this is a swollen lymph node.  Frankly, I am not sure why.  Because you are healthy and a non smoker, I am reassured that this is a benign lymph node - maybe some virus. I am glad you went to the dentist.  I would have wondered about a tooth infection. I expect this to go away on its own.  If it does not, call us in three weeks and Dr Erin Hearing or I will arrange an ENT referral.   Call sooner if you develop symptoms of fever, night sweats, weight loss, etc.   Again, I will be very surprised if this turns out to be anything important.

## 2020-11-06 NOTE — Progress Notes (Signed)
    SUBJECTIVE:   CHIEF COMPLAINT / HPI:   Patient believes he has a swollen lymph node under his left chin.  Present x 1 week.  No fever, sore throat ,viral symptoms, dental problems, night sweats or unexplained weight loss.  Never smoker.  Went to dentist yesterday and no dental cause of swollen node.  Some teeth missing.  Denies change in fit of his partial.  Due for COVID booster.  Got flu shot at work    OBJECTIVE:   BP 122/80   Pulse 100   Ht 6' (1.829 m)   Wt 251 lb 8 oz (114.1 kg)   SpO2 96%   BMI 34.11 kg/m   Mouth normal.  Has partial and multiple missing teeth. Neck. Left non tender submandibular node 1cm x 1cm.  Freely mobile.  Otherwise neck normal Lungs clear. No axillary, supraclavicular, epitrochlear or inguinal nodes.  ASSESSMENT/PLAN:   Lymphadenopathy, submandibular Single benign feeling node in low risk patient without obvious cause.  Observe.  ENT referral if fails to resolve.     Zenia Resides, MD [place]

## 2020-11-06 NOTE — Assessment & Plan Note (Signed)
Single benign feeling node in low risk patient without obvious cause.  Observe.  ENT referral if fails to resolve.

## 2020-11-08 ENCOUNTER — Ambulatory Visit (AMBULATORY_SURGERY_CENTER): Payer: 59 | Admitting: Gastroenterology

## 2020-11-08 ENCOUNTER — Encounter: Payer: Self-pay | Admitting: Gastroenterology

## 2020-11-08 ENCOUNTER — Other Ambulatory Visit: Payer: Self-pay

## 2020-11-08 VITALS — BP 129/84 | HR 68 | Temp 96.1°F | Resp 18 | Ht 72.0 in | Wt 249.0 lb

## 2020-11-08 DIAGNOSIS — Z1211 Encounter for screening for malignant neoplasm of colon: Secondary | ICD-10-CM

## 2020-11-08 DIAGNOSIS — D120 Benign neoplasm of cecum: Secondary | ICD-10-CM

## 2020-11-08 MED ORDER — SODIUM CHLORIDE 0.9 % IV SOLN: 500.0000 mL | Freq: Once | INTRAVENOUS | Status: DC

## 2020-11-08 NOTE — Progress Notes (Signed)
Called to room to assist during endoscopic procedure.  Patient ID and intended procedure confirmed with present staff. Received instructions for my participation in the procedure from the performing physician.  

## 2020-11-08 NOTE — Progress Notes (Signed)
Lidocaine 2% 53mL Iv given as per Dr. Loletha Carrow.

## 2020-11-08 NOTE — Patient Instructions (Signed)
YOU HAD AN ENDOSCOPIC PROCEDURE TODAY AT [facility]:   Refer to the procedure report that was given to you for any specific questions about what was found during the examination.  If the procedure report does not answer your questions, please call your gastroenterologist to clarify.  If you requested that your care partner not be given the details of your procedure findings, then the procedure report has been included in a sealed envelope for you to review at your convenience later.  YOU SHOULD EXPECT: Some feelings of bloating in the abdomen. Passage of more gas than usual.  Walking can help get rid of the air that was put into your GI tract during the procedure and reduce the bloating. If you had a lower endoscopy (such as a colonoscopy or flexible sigmoidoscopy) you may notice spotting of blood in your stool or on the toilet paper. If you underwent a bowel prep for your procedure, you may not have a normal bowel movement for a few days.  Please Note:  You might notice some irritation and congestion in your nose or some drainage.  This is from the oxygen used during your procedure.  There is no need for concern and it should clear up in a day or so.  SYMPTOMS TO REPORT IMMEDIATELY:   Following lower endoscopy (colonoscopy or flexible sigmoidoscopy):  Excessive amounts of blood in the stool  Significant tenderness or worsening of abdominal pains  Swelling of the abdomen that is new, acute  Fever of 100F or higher   For urgent or emergent issues, a gastroenterologist can be reached at any hour by calling [phone]. Do not use MyChart messaging for urgent concerns.    DIET:  We do recommend a small meal at first, but then you may proceed to your regular diet.  Drink plenty of fluids but you should avoid alcoholic beverages for 24 hours.  MEDICATIONS: Continue present medications.  FOLLOW UP: Repeat colonoscopy in 6 months for surveillance due to poor prep. 2-DAY prep  and abdominal binder for next exam.  Please see handouts given to you by your recovery nurse.  ACTIVITY:  You should plan to take it easy for the rest of today and you should NOT DRIVE or use heavy machinery until tomorrow (because of the sedation medicines used during the test).    FOLLOW UP: Our staff will call the number listed on your records 48-72 hours following your procedure to check on you and address any questions or concerns that you may have regarding the information given to you following your procedure. If we do not reach you, we will leave a message.  We will attempt to reach you two times.  During this call, we will ask if you have developed any symptoms of COVID 19. If you develop any symptoms (ie: fever, flu-like symptoms, shortness of breath, cough etc.) before then, please call [phone].  If you test positive for Covid 19 in the 2 weeks post procedure, please call and report this information to [place].    If any biopsies were taken you will be contacted by phone or by letter within the next 1-3 weeks.  Please call us at [phone] if you have not heard about the biopsies in 3 weeks.   Thank you for allowing [place] to provide for your healthcare needs today.   SIGNATURES/CONFIDENTIALITY: You and/or your care partner have signed paperwork which will be entered into your electronic medical record.  These signatures attest to the  fact that that the information above on your After Visit Summary has been reviewed and is understood.  Full responsibility of the confidentiality of this discharge information lies with you and/or your care-partner.

## 2020-11-08 NOTE — Op Note (Signed)
[place] Patient Name: [name] [name] Procedure Date: 11/08/2020 7:22 AM MRN: [id number] Endoscopist: Mallie Mussel L. Loletha Carrow , MD Age: 64 Referring MD:  Date of Birth: [birth year] Gender: Male Account #: [id number] Procedure:                Colonoscopy Indications:              Screening for colorectal malignant neoplasm (last                            colonoscopy 09/2009) Medicines:                Monitored Anesthesia Care Procedure:                Pre-Anesthesia Assessment:                           - Prior to the procedure, a History and Physical                            was performed, and patient medications and                            allergies were reviewed. The patient's tolerance of                            previous anesthesia was also reviewed. The risks                            and benefits of the procedure and the sedation                            options and risks were discussed with the patient.                            All questions were answered, and informed consent                            was obtained. Prior Anticoagulants: The patient has                            taken no previous anticoagulant or antiplatelet                            agents. ASA Grade Assessment: II - A patient with                            mild systemic disease. After reviewing the risks                            and benefits, the patient was deemed in                            satisfactory condition to undergo the procedure.  After obtaining informed consent, the colonoscope                            was passed under direct vision. Throughout the                            procedure, the patient's blood pressure, pulse, and                            oxygen saturations were monitored continuously. The                            Colonoscope was introduced through the anus and                            advanced to the the cecum, identified by                             appendiceal orifice and ileocecal valve. The                            colonoscopy was performed with difficulty due to                            poor bowel prep, a redundant colon, significant                            looping and the patient's body habitus. Successful                            completion of the procedure was aided by changing                            the patient to a semi-supine position and using                            manual pressure. The patient tolerated the                            procedure. The quality of the bowel preparation was                            poor. The ileocecal valve, appendiceal orifice, and                            rectum were photographed. The bowel preparation                            used was SUPREP. Scope In: 8:21:52 AM Scope Out: 8:43:22 AM Scope Withdrawal Time: 0 hours 9 minutes 36 seconds  Total Procedure Duration: 0 hours 21 minutes 30 seconds  Findings:                 Grade 2 internal hemorrhoids were found on perianal  exam.                           A large amount of semi-liquid stool was found in                            the entire colon, interfering with visualization.                            Lavage of the area was performed, resulting in                            incomplete clearance with continued poor                            visualization.                           A 10 mm x 81mm oblong polyp was found in the cecum.                            The polyp was sessile. The polyp was removed with a                            cold snare. Resection and retrieval were complete.                           The entire colon was redundant, causing scope                            looping.                           Retroflexion in the rectum was not performed due to                            narrow anatomy.                           The exam was otherwise without  abnormality. Complications:            No immediate complications. Estimated Blood Loss:     Estimated blood loss was minimal. Impression:               - Preparation of the colon was poor.                           - Hemorrhoids found on perianal exam.                           - Stool in the entire examined colon.                           - One 10 mm polyp in the cecum, removed with a cold  snare. Resected and retrieved.                           - Redundant colon.                           - The examination was otherwise normal. Recommendation:           - Patient has a contact number available for                            emergencies. The signs and symptoms of potential                            delayed complications were discussed with the                            patient. Return to normal activities tomorrow.                            Written discharge instructions were provided to the                            patient.                           - Resume previous diet.                           - Continue present medications.                           - Await pathology results.                           - Repeat colonoscopy in 6 months for surveillance                            due to poor prep. 2-DAY PREP AND ABDOMINAL BINDER                            FOR NEXT EXAM Saory Carriero L. Loletha Carrow, MD 11/08/2020 8:51:51 AM This report has been signed electronically.

## 2020-11-08 NOTE — Progress Notes (Signed)
pt tolerated well. VSS. awake and to recovery. Report given to RN.  

## 2020-11-08 NOTE — Progress Notes (Signed)
Pt's states no medical or surgical changes since previsit or office visit.  Cw vitals, KW IV.

## 2020-11-12 ENCOUNTER — Telehealth: Payer: Self-pay

## 2020-11-12 NOTE — Telephone Encounter (Signed)
Called [phone] and left a message we tried to reach pt for a follow up call. ma

## 2020-11-18 ENCOUNTER — Encounter: Payer: Self-pay | Admitting: Gastroenterology

## 2020-12-24 ENCOUNTER — Other Ambulatory Visit: Payer: Self-pay | Admitting: Family Medicine

## 2020-12-25 ENCOUNTER — Other Ambulatory Visit: Payer: Self-pay | Admitting: Family Medicine

## 2020-12-25 MED FILL — HYDROCHLOROTHIAZIDE 25 MG T: 25 | 90 days supply | Qty: 90 | Fill #0

## 2020-12-31 DIAGNOSIS — M1711 Unilateral primary osteoarthritis, right knee: Secondary | ICD-10-CM | POA: Diagnosis not present

## 2020-12-31 DIAGNOSIS — M25561 Pain in right knee: Secondary | ICD-10-CM | POA: Diagnosis not present

## 2021-01-21 DIAGNOSIS — M25561 Pain in right knee: Secondary | ICD-10-CM | POA: Diagnosis not present

## 2021-03-05 DIAGNOSIS — M9902 Segmental and somatic dysfunction of thoracic region: Secondary | ICD-10-CM | POA: Diagnosis not present

## 2021-03-05 DIAGNOSIS — M62838 Other muscle spasm: Secondary | ICD-10-CM | POA: Diagnosis not present

## 2021-03-05 DIAGNOSIS — M546 Pain in thoracic spine: Secondary | ICD-10-CM | POA: Diagnosis not present

## 2021-03-10 DIAGNOSIS — M62838 Other muscle spasm: Secondary | ICD-10-CM | POA: Diagnosis not present

## 2021-03-10 DIAGNOSIS — M546 Pain in thoracic spine: Secondary | ICD-10-CM | POA: Diagnosis not present

## 2021-03-10 DIAGNOSIS — M9902 Segmental and somatic dysfunction of thoracic region: Secondary | ICD-10-CM | POA: Diagnosis not present

## 2021-03-14 ENCOUNTER — Encounter: Payer: Self-pay | Admitting: Gastroenterology

## 2021-03-17 MED FILL — HYDROCHLOROTHIAZIDE 25 MG T: 25 | 90 days supply | Qty: 90 | Fill #1

## 2021-06-10 ENCOUNTER — Other Ambulatory Visit (HOSPITAL_COMMUNITY): Payer: Self-pay

## 2021-06-10 MED FILL — Hydrochlorothiazide Tab 25 MG: ORAL | 90 days supply | Qty: 90 | Fill #0 | Status: AC

## 2021-06-24 ENCOUNTER — Encounter: Payer: 59 | Admitting: Family Medicine

## 2021-07-08 ENCOUNTER — Other Ambulatory Visit: Payer: Self-pay

## 2021-07-08 ENCOUNTER — Ambulatory Visit (INDEPENDENT_AMBULATORY_CARE_PROVIDER_SITE_OTHER): Payer: 59

## 2021-07-08 ENCOUNTER — Ambulatory Visit (INDEPENDENT_AMBULATORY_CARE_PROVIDER_SITE_OTHER): Payer: 59 | Admitting: Family Medicine

## 2021-07-08 ENCOUNTER — Other Ambulatory Visit (HOSPITAL_COMMUNITY): Payer: Self-pay

## 2021-07-08 VITALS — BP 140/89 | HR 79 | Wt 243.0 lb

## 2021-07-08 DIAGNOSIS — E669 Obesity, unspecified: Secondary | ICD-10-CM | POA: Diagnosis not present

## 2021-07-08 DIAGNOSIS — R109 Unspecified abdominal pain: Secondary | ICD-10-CM

## 2021-07-08 DIAGNOSIS — Z125 Encounter for screening for malignant neoplasm of prostate: Secondary | ICD-10-CM | POA: Diagnosis not present

## 2021-07-08 DIAGNOSIS — I10 Essential (primary) hypertension: Secondary | ICD-10-CM

## 2021-07-08 DIAGNOSIS — Z23 Encounter for immunization: Secondary | ICD-10-CM | POA: Diagnosis not present

## 2021-07-08 DIAGNOSIS — Z114 Encounter for screening for human immunodeficiency virus [HIV]: Secondary | ICD-10-CM

## 2021-07-08 MED ORDER — ZOSTER VAC RECOMB ADJUVANTED 50 MCG/0.5ML IM SUSR
INTRAMUSCULAR | 1 refills | Status: DC
  Filled 2021-07-08: qty 0.5, fill #0
  Filled 2021-07-08: qty 0.5, 1d supply, fill #0
  Filled 2021-09-17: qty 0.5, 1d supply, fill #1
  Filled ????-??-??: fill #1

## 2021-07-08 NOTE — Assessment & Plan Note (Signed)
Discussed diet exercise.  See after visit summary

## 2021-07-08 NOTE — Progress Notes (Signed)
    SUBJECTIVE:   CHIEF COMPLAINT / HPI:   Here for annual physical.  Overall feels well  Hypertension Has a blood pressure cuff but does not check.  Takes hctz daily.  No lightheadness or edema  Abdomen pain - still has intermittent mild LLQ abdomen pain.  Did follow up with GI.  Has to call to schedule repeat colonoscopy   Lymph node - resolved  Exercise - not regularly.  Does like to walk Diet - could be better.  Knows what he has to do.  Lost a good amount of weight several years ago preparing to go to [employer] - maybe in the next 1-2 years  PERTINENT  PMH / [place]: Has an RV and like to camp  OBJECTIVE:   BP 140/89   Pulse 79   Wt 243 lb (110.2 kg)   SpO2 96%   BMI 32.96 kg/m   Heart - Regular rate and rhythm.  No murmurs, gallops or rubs.    Lungs:  Normal respiratory effort, chest expands symmetrically. Lungs are clear to auscultation, no crackles or wheezes. Abdomen: soft and without masses, organomegaly or hernias noted.  No guarding or rebound.  Is tender to deep palpation in LLQ without mass Extremities:  No cyanosis, edema, or deformity noted with good range of motion of all major joints.   Skin:  Intact without suspicious lesions or rashes Numerous pigmented benign appearing moles and SKs on back   ASSESSMENT/PLAN:   HTN (hypertension) Not at goal at today's visit.  Recommend home monitoring   Obesity (BMI 30-39.9) Discussed diet exercise.  See after visit summary   Abdominal pain Recommend follow up with GI as they recommended    HM - see after visit summary for plans    Lind Covert, MD [place]

## 2021-07-08 NOTE — Assessment & Plan Note (Signed)
Not at goal at today's visit.  Recommend home monitoring

## 2021-07-08 NOTE — Patient Instructions (Signed)
Good to see you today - Thank you for coming in  Things we discussed today:  For the abdomen pain - see your gastroenterologist   Get your shingle shot 1-2 mnths after your Covid shot today  I will call you if your tests are not good.  Otherwise, I will send you a message on MyChart (if it is active) or a letter in the mail..  If you do not hear from me with in 2 weeks please call our office.     You should exercise at least 20 minutes every day.    Choose something you like the most or hate the least.   Having a set time every day and having a partner will help you stick to it.    If you are too tired try to do at least 5 minutes, it often gets easier.   Diet - cut out sweet drinks - get back on your [employer].   Your goal blood pressure is less than 140/90.  Check your blood pressure several times a week.  If regularly higher than this please let me know - either with MyChart or leaving a phone message. Next visit please bring in your blood pressure cuff.     Come back to see me in one year

## 2021-07-08 NOTE — Assessment & Plan Note (Signed)
Recommend follow up with GI as they recommended

## 2021-07-09 ENCOUNTER — Other Ambulatory Visit (HOSPITAL_COMMUNITY): Payer: Self-pay

## 2021-07-09 LAB — CMP14+EGFR
ALT: 31 IU/L (ref 0–44)
AST: 29 IU/L (ref 0–40)
Albumin/Globulin Ratio: 1.7 (ref 1.2–2.2)
Albumin: 4.3 g/dL (ref 3.8–4.8)
Alkaline Phosphatase: 78 IU/L (ref 44–121)
BUN/Creatinine Ratio: 19 (ref 10–24)
BUN: 16 mg/dL (ref 8–27)
Bilirubin Total: 1 mg/dL (ref 0.0–1.2)
CO2: 22 mmol/L (ref 20–29)
Calcium: 9.7 mg/dL (ref 8.6–10.2)
Chloride: 102 mmol/L (ref 96–106)
Creatinine, Ser: 0.84 mg/dL (ref 0.76–1.27)
Globulin, Total: 2.5 g/dL (ref 1.5–4.5)
Glucose: 100 mg/dL — ABNORMAL HIGH (ref 65–99)
Potassium: 3.9 mmol/L (ref 3.5–5.2)
Sodium: 139 mmol/L (ref 134–144)
Total Protein: 6.8 g/dL (ref 6.0–8.5)
eGFR: 97 mL/min/{1.73_m2} (ref >59)

## 2021-07-09 LAB — PSA: Prostate Specific Ag, Serum: 1.6 ng/mL (ref 0.0–4.0)

## 2021-07-09 NOTE — Progress Notes (Signed)
   Covid-19 Vaccination Clinic  Name:  [name] [name]    MRN: [id number] DOB: [birth year]  07/09/2021  [name] was observed post Covid-19 immunization for 15 minutes without incident. He was provided with Vaccine Information Sheet and instruction to access the V-Safe system.   [name] was instructed to call 911 with any severe reactions post vaccine: Difficulty breathing  Swelling of face and throat  A fast heartbeat  A bad rash all over body  Dizziness and weakness

## 2021-07-22 ENCOUNTER — Other Ambulatory Visit (HOSPITAL_COMMUNITY): Payer: Self-pay

## 2021-09-01 ENCOUNTER — Other Ambulatory Visit (HOSPITAL_COMMUNITY): Payer: Self-pay

## 2021-09-01 MED FILL — Hydrochlorothiazide Tab 25 MG: ORAL | 90 days supply | Qty: 90 | Fill #1 | Status: AC

## 2021-09-04 ENCOUNTER — Other Ambulatory Visit (HOSPITAL_COMMUNITY): Payer: Self-pay

## 2021-09-17 ENCOUNTER — Other Ambulatory Visit (HOSPITAL_COMMUNITY): Payer: Self-pay

## 2021-10-14 ENCOUNTER — Other Ambulatory Visit (HOSPITAL_COMMUNITY): Payer: Self-pay

## 2021-10-14 ENCOUNTER — Other Ambulatory Visit: Payer: Self-pay

## 2021-10-14 ENCOUNTER — Ambulatory Visit: Payer: 59 | Admitting: Family Medicine

## 2021-10-14 VITALS — BP 152/86 | HR 84 | Ht 72.0 in | Wt 244.0 lb

## 2021-10-14 DIAGNOSIS — I10 Essential (primary) hypertension: Secondary | ICD-10-CM | POA: Diagnosis not present

## 2021-10-14 DIAGNOSIS — R040 Epistaxis: Secondary | ICD-10-CM | POA: Diagnosis not present

## 2021-10-14 MED ORDER — AMLODIPINE BESYLATE 10 MG PO TABS
10.0000 mg | ORAL_TABLET | Freq: Every day | ORAL | 3 refills | Status: DC
  Filled 2021-10-14: qty 90, 90d supply, fill #0
  Filled 2021-12-29: qty 90, 90d supply, fill #1
  Filled 2022-03-20: qty 90, 90d supply, fill #2
  Filled 2022-06-19: qty 90, 90d supply, fill #3

## 2021-10-14 NOTE — Patient Instructions (Signed)
We are starting a second blood pressure medication called amlodipine.  I have sent this to your pharmacy.  I would like for you to come back and either see a provider or nurse visit for blood pressure check in about 1 week.  Please continue to check your blood pressures about once per day in the meantime.  Expect that it will take a couple of days for this medication to kick in.  For your nosebleeds I do not believe we need to do anything additional at this time.  He can consider using a nasal spray such as Flonase which may help.  You can also consider using a humidifier in your home to keep the area a bit more moist.  Have any nosebleeds that do not stop within a few minutes of pressure or other concerns not hesitate to follow-up with [place].

## 2021-10-14 NOTE — Assessment & Plan Note (Signed)
Blood pressure today of 152/86.  Continues on hydrochlorothiazide 25 mg daily.  We will initiate amlodipine 10 mg daily.  Follow-up in 1 week for blood pressure check.

## 2021-10-14 NOTE — Progress Notes (Signed)
    SUBJECTIVE:   CHIEF COMPLAINT / HPI:   Hypertension: Current meds of hctz 25mg . He took his pressure at work a few times and noted elevated pressure with the diastolic being 829 at times. This morning his pressure was 155/103 and last night 144/95.  Nose bleeds: Had three of them. This prompted him to check his blood pressure. The first two stopped very quickly and the third took a few minutes. He had some blood go down the back of his throat last night when he was lying down and it started but it quickly resolved.   PERTINENT  PMH / PSH: HTN  OBJECTIVE:   BP (!) 152/86   Pulse 84   Ht 6' (1.829 m)   Wt 244 lb (110.7 kg)   SpO2 96%   BMI 33.09 kg/m    General: NAD, pleasant, able to participate in exam HEENT: nares clear, no dried blood or active bleeding  Cardiac: RRR, no murmurs. Respiratory: CTAB, normal effort, No wheezes, rales or rhonchi Extremities: No edema of the bilateral extremities Skin: warm and dry, no rashes noted Neuro: alert, no obvious focal deficits Psych: Normal affect and mood  ASSESSMENT/PLAN:   HTN (hypertension) Blood pressure today of 152/86.  Continues on hydrochlorothiazide 25 mg daily.  We will initiate amlodipine 10 mg daily.  Follow-up in 1 week for blood pressure check.   Nosebleeds: Patient with 3 nosebleeds over the past week or so.  No current nosebleed and no dried blood present in the nares.  Most likely secondary to change in seasons with dry air as patient to start using his heat in his home lately.  Discussed symptomatic treatment including consideration for internasal steroids like Flonase versus using a humidifier at home.  Discussed return precautions if he has other nosebleeds that do not resolve.   Lurline Del, [place]

## 2021-10-21 ENCOUNTER — Emergency Department (HOSPITAL_BASED_OUTPATIENT_CLINIC_OR_DEPARTMENT_OTHER): Payer: 59

## 2021-10-21 ENCOUNTER — Encounter (HOSPITAL_BASED_OUTPATIENT_CLINIC_OR_DEPARTMENT_OTHER): Payer: Self-pay | Admitting: *Deleted

## 2021-10-21 ENCOUNTER — Other Ambulatory Visit: Payer: Self-pay

## 2021-10-21 ENCOUNTER — Emergency Department (HOSPITAL_BASED_OUTPATIENT_CLINIC_OR_DEPARTMENT_OTHER)
Admission: EM | Admit: 2021-10-21 | Discharge: 2021-10-21 | Disposition: A | Discharge status: Home / Self Care | Payer: 59 | Attending: Emergency Medicine | Admitting: Emergency Medicine

## 2021-10-21 ENCOUNTER — Other Ambulatory Visit (HOSPITAL_BASED_OUTPATIENT_CLINIC_OR_DEPARTMENT_OTHER): Payer: Self-pay

## 2021-10-21 DIAGNOSIS — K8020 Calculus of gallbladder without cholecystitis without obstruction: Secondary | ICD-10-CM | POA: Diagnosis not present

## 2021-10-21 DIAGNOSIS — U071 COVID-19: Secondary | ICD-10-CM | POA: Insufficient documentation

## 2021-10-21 DIAGNOSIS — N200 Calculus of kidney: Secondary | ICD-10-CM | POA: Diagnosis not present

## 2021-10-21 DIAGNOSIS — N201 Calculus of ureter: Secondary | ICD-10-CM | POA: Diagnosis not present

## 2021-10-21 DIAGNOSIS — I10 Essential (primary) hypertension: Secondary | ICD-10-CM | POA: Insufficient documentation

## 2021-10-21 DIAGNOSIS — Z79899 Other long term (current) drug therapy: Secondary | ICD-10-CM | POA: Insufficient documentation

## 2021-10-21 DIAGNOSIS — R509 Fever, unspecified: Secondary | ICD-10-CM | POA: Diagnosis present

## 2021-10-21 LAB — CBC
HCT: 47.5 % (ref 39.0–52.0)
Hemoglobin: 16.4 g/dL (ref 13.0–17.0)
MCH: 28.5 pg (ref 26.0–34.0)
MCHC: 34.5 g/dL (ref 30.0–36.0)
MCV: 82.6 fL (ref 80.0–100.0)
Platelets: 168 10*3/uL (ref 150–400)
RBC: 5.75 MIL/uL (ref 4.22–5.81)
RDW: 13.1 % (ref 11.5–15.5)
WBC: 10.9 10*3/uL — ABNORMAL HIGH (ref 4.0–10.5)
nRBC: 0 % (ref 0.0–0.2)

## 2021-10-21 LAB — COMPREHENSIVE METABOLIC PANEL
ALT: 21 U/L (ref 0–44)
AST: 20 U/L (ref 15–41)
Albumin: 4.2 g/dL (ref 3.5–5.0)
Alkaline Phosphatase: 59 U/L (ref 38–126)
Anion gap: 9 (ref 5–15)
BUN: 19 mg/dL (ref 8–23)
CO2: 25 mmol/L (ref 22–32)
Calcium: 9.9 mg/dL (ref 8.9–10.3)
Chloride: 100 mmol/L (ref 98–111)
Creatinine, Ser: 1.35 mg/dL — ABNORMAL HIGH (ref 0.61–1.24)
GFR, Estimated: 58 mL/min — ABNORMAL LOW (ref >60)
Glucose, Bld: 114 mg/dL — ABNORMAL HIGH (ref 70–99)
Potassium: 3.8 mmol/L (ref 3.5–5.1)
Sodium: 134 mmol/L — ABNORMAL LOW (ref 135–145)
Total Bilirubin: 2 mg/dL — ABNORMAL HIGH (ref 0.3–1.2)
Total Protein: 7.5 g/dL (ref 6.5–8.1)

## 2021-10-21 LAB — LIPASE, BLOOD: Lipase: 30 U/L (ref 11–51)

## 2021-10-21 LAB — RESP PANEL BY RT-PCR (FLU A&B, COVID) ARPGX2
Influenza A by PCR: NEGATIVE
Influenza B by PCR: NEGATIVE
SARS Coronavirus 2 by RT PCR: POSITIVE — AB

## 2021-10-21 LAB — URINALYSIS, ROUTINE W REFLEX MICROSCOPIC
Bilirubin Urine: NEGATIVE
Glucose, UA: NEGATIVE mg/dL
Ketones, ur: NEGATIVE mg/dL
Leukocytes,Ua: NEGATIVE
Nitrite: NEGATIVE
Specific Gravity, Urine: 1.04 — ABNORMAL HIGH (ref 1.005–1.030)
pH: 6.5 (ref 5.0–8.0)

## 2021-10-21 MED ORDER — ONDANSETRON HCL 4 MG/2ML IJ SOLN
4.0000 mg | Freq: Once | INTRAMUSCULAR | Status: AC | PRN
  Administered 2021-10-21: 4 mg via INTRAVENOUS
  Filled 2021-10-21: qty 2

## 2021-10-21 MED ORDER — ACETAMINOPHEN 500 MG PO TABS
1000.0000 mg | ORAL_TABLET | Freq: Once | ORAL | Status: AC
  Administered 2021-10-21: 1000 mg via ORAL
  Filled 2021-10-21: qty 2

## 2021-10-21 MED ORDER — IOHEXOL 300 MG/ML  SOLN
100.0000 mL | Freq: Once | INTRAMUSCULAR | Status: AC | PRN
  Administered 2021-10-21: 100 mL via INTRAVENOUS

## 2021-10-21 MED ORDER — TAMSULOSIN HCL 0.4 MG PO CAPS
0.4000 mg | ORAL_CAPSULE | Freq: Every day | ORAL | 0 refills | Status: AC
  Filled 2021-10-21: qty 7, 7d supply, fill #0

## 2021-10-21 MED ORDER — SODIUM CHLORIDE 0.9 % IV BOLUS
1000.0000 mL | Freq: Once | INTRAVENOUS | Status: AC
  Administered 2021-10-21: 1000 mL via INTRAVENOUS

## 2021-10-21 NOTE — ED Provider Notes (Signed)
[name] EMERGENCY DEPT Provider Note   CSN: [id number] Arrival date & time: 10/21/21  0808     History Chief Complaint  Patient presents with   Abdominal Pain   Nausea   Fever    [name] [name] is a 65 y.o. male.  HPI 65 year old male presents with diffuse abdominal pain.  Started yesterday afternoon around lunch.  He had not eaten yet.  He has had some nausea and dry heaving.  He felt like he had a fever last night though he did not take his temperature.  He took Tylenol.  The abdominal pain has been continuous and is rated as a 10.  There is no one spot that hurts.  He is also been having constipation to the point that he is having bowel movements but they are smaller than normal.  He tried MiraLAX and stool softener with no relief.  He had a similar type pain a month ago or so but it went away after having bowel movements.  No back pain, chest pain, urinary symptoms.  Past Medical History:  Diagnosis Date   GERD (gastroesophageal reflux disease)    Hyperlipidemia    Hypertension    Varicose veins     Patient Active Problem List   Diagnosis Date Noted   Abdominal pain 06/20/2019   Apraxia of eyelid opening 06/07/2017   HTN (hypertension) 03/26/2016   Varicose veins of right lower extremities with other complications 81/82/9937   Varicose veins of leg with complications 16/96/7893   Obesity (BMI 30-39.9) 11/01/2013   GERD (gastroesophageal reflux disease) 03/13/2013    Past Surgical History:  Procedure Laterality Date   COLONOSCOPY  2010   ESOPHAGOGASTRIC FUNDOPLICATION  8101   HERNIA REPAIR     MANDIBLE SURGERY         Family History  Problem Relation Age of Onset   Diabetes Mother    Hypertension Mother    Heart disease Mother    Varicose Veins Mother    Diabetes Father    Hypertension Father    Varicose Veins Father    Heart attack Father    Cancer Sister    Diabetes Sister    Colon polyps Neg Hx    Colon cancer Neg Hx     Esophageal cancer Neg Hx    Rectal cancer Neg Hx    Stomach cancer Neg Hx     Social History   Tobacco Use   Smoking status: Never   Smokeless tobacco: Never  Vaping Use   Vaping Use: Never used  Substance Use Topics   Alcohol use: Yes    Alcohol/week: 1.0 standard drink    Types: 1 Cans of beer per week   Drug use: No    Home Medications Prior to Admission medications   Medication Sig Start Date End Date Taking? Authorizing Provider  amLODipine (NORVASC) 10 MG tablet Take 1 tablet (10 mg total) by mouth at bedtime. 10/14/21  Yes Welborn, Ryan, DO  hydrochlorothiazide (HYDRODIURIL) 25 MG tablet TAKE 1 TABLET (25 MG TOTAL) BY MOUTH DAILY. 12/25/20 12/25/21 Yes Chambliss, Jeb Levering, MD  tamsulosin (FLOMAX) 0.4 MG CAPS capsule Take 1 capsule (0.4 mg total) by mouth daily for 7 days. 10/21/21 10/28/21 Yes Sherwood Gambler, MD  hydrOXYzine (ATARAX/VISTARIL) 25 MG tablet TAKE 1 TABLET BY MOUTH DAILY AS NEEDED FOR SITUATIONAL ANXIETY Patient not taking: No sig reported 04/18/20   Lind Covert, MD  pantoprazole (PROTONIX) 40 MG tablet TAKE 1 TABLET BY MOUTH DAILY. 09/09/20  Lind Covert, MD  Zoster Vaccine Adjuvanted [facility]) injection Inject 0.5 ml IM and Repeat in 2 months 07/08/21   Lind Covert, MD    Allergies    Patient has no known allergies.  Review of Systems   Review of Systems  Constitutional:  Positive for fever.  Gastrointestinal:  Positive for abdominal pain and nausea. Negative for vomiting.  Genitourinary:  Negative for dysuria.  All other systems reviewed and are negative.  Physical Exam Updated Vital Signs BP (!) 138/95   Pulse 74   Temp 100 F (37.8 C) (Oral)   Resp 15   Ht 6' (1.829 m)   Wt 108.9 kg   SpO2 94%   BMI 32.55 kg/m   Physical Exam Vitals and nursing note reviewed.  Constitutional:      Appearance: He is well-developed.  HENT:     Head: Normocephalic and atraumatic.     Right Ear: External ear normal.     Left  Ear: External ear normal.     Nose: Nose normal.  Eyes:     General:        Right eye: No discharge.        Left eye: No discharge.  Cardiovascular:     Rate and Rhythm: Normal rate and regular rhythm.     Heart sounds: Normal heart sounds.  Pulmonary:     Effort: Pulmonary effort is normal.     Breath sounds: Normal breath sounds.  Abdominal:     Palpations: Abdomen is soft.     Tenderness: There is abdominal tenderness in the left lower quadrant. There is no right CVA tenderness or left CVA tenderness.  Musculoskeletal:     Cervical back: Neck supple.  Skin:    General: Skin is warm and dry.  Neurological:     Mental Status: He is alert.  Psychiatric:        Mood and Affect: Mood is not anxious.    ED Results / Procedures / Treatments   Labs (all labs ordered are listed, but only abnormal results are displayed) Labs Reviewed  RESP PANEL BY RT-PCR (FLU A&B, COVID) ARPGX2 - Abnormal; Notable for the following components:      Result Value   SARS Coronavirus 2 by RT PCR POSITIVE (*)    All other components within normal limits  COMPREHENSIVE METABOLIC PANEL - Abnormal; Notable for the following components:   Sodium 134 (*)    Glucose, Bld 114 (*)    Creatinine, Ser 1.35 (*)    Total Bilirubin 2.0 (*)    GFR, Estimated 58 (*)    All other components within normal limits  CBC - Abnormal; Notable for the following components:   WBC 10.9 (*)    All other components within normal limits  URINALYSIS, ROUTINE W REFLEX MICROSCOPIC - Abnormal; Notable for the following components:   Specific Gravity, Urine 1.040 (*)    Hgb urine dipstick LARGE (*)    Protein, ur TRACE (*)    All other components within normal limits  URINE CULTURE  LIPASE, BLOOD    EKG None  Radiology CT ABDOMEN PELVIS W CONTRAST  Result Date: 10/21/2021 CLINICAL DATA:  65 year old male with history of abdominal pain, fever and nausea. Suspected acute diverticulitis. EXAM: CT ABDOMEN AND PELVIS WITH  CONTRAST TECHNIQUE: Multidetector CT imaging of the abdomen and pelvis was performed using the standard protocol following bolus administration of intravenous contrast. CONTRAST:  175mL OMNIPAQUE IOHEXOL 300 MG/ML  SOLN COMPARISON:  No  priors. FINDINGS: Lower chest: Moderate-sized hiatal hernia. Scarring and/or subsegmental atelectasis in the lower lobes of the lungs bilaterally. Hepatobiliary: No suspicious cystic or solid hepatic lesions. No intra or extrahepatic biliary ductal dilatation. Numerous tiny calcified gallstones lying dependently in the gallbladder. Gallbladder is moderately distended. No gallbladder wall thickening or pericholecystic fluid or surrounding inflammatory changes to suggest an acute cholecystitis at this time. Pancreas: No pancreatic mass. No pancreatic ductal dilatation. No pancreatic or peripancreatic fluid collections or inflammatory changes. Spleen: Unremarkable. Adrenals/Urinary Tract: Several nonobstructive calculi are noted in the collecting system of the left kidney measuring 2-4 mm in size. Tiny 2-3 mm nonobstructive calculi are also noted in the right renal collecting system. In addition, in the distal third of the left ureter (axial image 67 of series 2) there is a 7 mm calculus which is associated with mild proximal left hydroureteronephrosis and left-sided perinephric stranding indicating obstruction at this time. Multiple subcentimeter low-attenuation lesions in both kidneys are too small to definitively characterize, but are statistically likely to represent tiny cysts. No right hydroureteronephrosis. Bilateral adrenal glands are normal in appearance. The anterolateral aspect of the urinary bladder extends into a left-sided inguinoscrotal hernia. Stomach/Bowel: Postoperative changes near the gastroesophageal junction suggesting prior Nissen fundoplication. Remainder of the stomach is otherwise normal in appearance. No pathologic dilatation of small bowel or colon. Redundant  sigmoid colon which extends into the right upper quadrant of the abdomen incidentally noted. No significant diverticular disease. Normal appendix. Vascular/Lymphatic: Aortic atherosclerosis, without evidence of aneurysm or dissection in the abdominal or pelvic vasculature. No lymphadenopathy noted in the abdomen or pelvis. Reproductive: Prostate gland and seminal vesicles are unremarkable in appearance. Other: Postoperative changes of prior left inguinal hernia raphe. Despite this, there is a recurrent left inguinoscrotal hernia, which contains a portion of the anterolateral aspect of the urinary bladder wall on the left. No significant volume of ascites. No pneumoperitoneum. Musculoskeletal: There are no aggressive appearing lytic or blastic lesions noted in the visualized portions of the skeleton. IMPRESSION: 1. In addition to multiple nonobstructive calculi in the collecting systems of both kidneys there is a 7 mm obstructing stone in the distal third of the left ureter with mild proximal left hydroureteronephrosis and left-sided perinephric stranding. 2. Status post left inguinal hernial raphe with recurrent left inguinoscrotal hernia containing a portion of the urinary bladder. 3. Status post Nissen fundoplication with recurrent moderate hiatal hernia. 4. Cholelithiasis without evidence of acute cholecystitis. 5. Aortic atherosclerosis. 6. Additional incidental findings, as above. Electronically Signed   By: Vinnie Langton M.D.   On: 10/21/2021 10:12    Procedures Procedures   Medications Ordered in ED Medications  acetaminophen (TYLENOL) tablet 1,000 mg (1,000 mg Oral Given 10/21/21 0932)  sodium chloride 0.9 % bolus 1,000 mL (0 mLs Intravenous Stopped 10/21/21 1129)  ondansetron (ZOFRAN) injection 4 mg (4 mg Intravenous Given 10/21/21 0937)  iohexol (OMNIPAQUE) 300 MG/ML solution 100 mL (100 mLs Intravenous Contrast Given 10/21/21 0945)    ED Course  I have reviewed the triage vital signs and the  nursing notes.  Pertinent labs & imaging results that were available during my care of the patient were reviewed by me and considered in my medical decision making (see chart for details).    MDM Rules/Calculators/A&P                           Patient has improved pain with Tylenol.  He is now very comfortable.  He  has a low-grade fever which is concerning given his CT shows ureteral stone with obstruction.  However his urinalysis is benign besides some blood.  He is also COVID positive MI does not bother COVID symptoms this would be a pretty reasonable explanation for a low-grade fever.  Discussed with Dr. Tresa Moore, and given these findings as well as well appearance is reasonable to hold off on emergent stent placement/treatment and have him discharged to follow-up very closely with urology or return if symptoms worsen.  He has noted to have an inguinal hernia on CT although he has not noticed it and felt like his hernia has been repaired.  There is a small piece of bladder and there which is not causing any obvious obstruction.  Dr. Tresa Moore feels like he could just follow-up with his surgeon as an outpatient and this is not really emergent.  Patient was informed of this.  Otherwise, will give Flomax given the size of the stone and we discussed the strict return precautions.  He declines anything for COVID such as Paxlovid. Final Clinical Impression(s) / ED Diagnoses Final diagnoses:  Left ureteral stone  COVID-19    Rx / DC Orders ED Discharge Orders          Ordered    tamsulosin (FLOMAX) 0.4 MG CAPS capsule  Daily        10/21/21 1249             Sherwood Gambler, MD 10/21/21 1319

## 2021-10-21 NOTE — Discharge Instructions (Addendum)
You have a kidney stone in the left ureter that is blocking your left kidney.  If you develop poorly controlled or new/worsening pain, vomiting, urinary tract infection symptoms, high fever, or any other new/concerning symptoms then you need to see urology or return to the emergency department.

## 2021-10-21 NOTE — ED Notes (Signed)
D/c paperwork reviewed with pt, including prescription and f/u care. Pt ambulatory to ED exit on RA, NAD .

## 2021-10-21 NOTE — ED Triage Notes (Signed)
Reports 2 days, fever, abd pain and nausea, has not been able to have normal BM's last 5 days, as if constipated, told Has tried OTC meds to get relief without results.

## 2021-10-21 NOTE — ED Notes (Signed)
Lab called to very pt is covid positive

## 2021-10-22 LAB — URINE CULTURE: Culture: NO GROWTH

## 2021-10-28 DIAGNOSIS — N201 Calculus of ureter: Secondary | ICD-10-CM | POA: Diagnosis not present

## 2021-10-30 ENCOUNTER — Other Ambulatory Visit: Payer: Self-pay | Admitting: Urology

## 2021-10-30 DIAGNOSIS — N201 Calculus of ureter: Secondary | ICD-10-CM

## 2021-11-04 NOTE — Progress Notes (Signed)
Patient to arrive at 1200 on 11/06/2021.History and medications reviewed. Pre-procedure instructions given. BP medications morning of procedure. NPO after 0800, except for clear liquids until 1000. Driver secured.

## 2021-11-06 ENCOUNTER — Other Ambulatory Visit (HOSPITAL_COMMUNITY): Payer: Self-pay

## 2021-11-06 ENCOUNTER — Other Ambulatory Visit: Payer: Self-pay

## 2021-11-06 ENCOUNTER — Ambulatory Visit (HOSPITAL_BASED_OUTPATIENT_CLINIC_OR_DEPARTMENT_OTHER)
Admission: RE | Admit: 2021-11-06 | Discharge: 2021-11-06 | Disposition: A | Discharge status: Home / Self Care | Payer: 59 | Attending: Urology | Admitting: Urology

## 2021-11-06 ENCOUNTER — Encounter (HOSPITAL_BASED_OUTPATIENT_CLINIC_OR_DEPARTMENT_OTHER)
Admission: RE | Disposition: A | Discharge status: Home / Self Care | Payer: Self-pay | Source: Home / Self Care | Attending: Urology

## 2021-11-06 ENCOUNTER — Ambulatory Visit (HOSPITAL_COMMUNITY): Payer: 59

## 2021-11-06 ENCOUNTER — Encounter (HOSPITAL_BASED_OUTPATIENT_CLINIC_OR_DEPARTMENT_OTHER): Payer: Self-pay | Admitting: Urology

## 2021-11-06 DIAGNOSIS — Z6833 Body mass index (BMI) 33.0-33.9, adult: Secondary | ICD-10-CM | POA: Insufficient documentation

## 2021-11-06 DIAGNOSIS — E669 Obesity, unspecified: Secondary | ICD-10-CM | POA: Insufficient documentation

## 2021-11-06 DIAGNOSIS — I10 Essential (primary) hypertension: Secondary | ICD-10-CM | POA: Diagnosis not present

## 2021-11-06 DIAGNOSIS — N201 Calculus of ureter: Secondary | ICD-10-CM | POA: Diagnosis not present

## 2021-11-06 DIAGNOSIS — Z79899 Other long term (current) drug therapy: Secondary | ICD-10-CM | POA: Insufficient documentation

## 2021-11-06 DIAGNOSIS — N200 Calculus of kidney: Secondary | ICD-10-CM | POA: Diagnosis not present

## 2021-11-06 HISTORY — PX: EXTRACORPOREAL SHOCK WAVE LITHOTRIPSY: SHX1557

## 2021-11-06 SURGERY — LITHOTRIPSY, ESWL
Anesthesia: LOCAL | Laterality: Left

## 2021-11-06 MED ORDER — TAMSULOSIN HCL 0.4 MG PO CAPS
0.4000 mg | ORAL_CAPSULE | Freq: Every day | ORAL | 0 refills | Status: DC
  Filled 2021-11-06: qty 30, 30d supply, fill #0

## 2021-11-06 MED ORDER — ONDANSETRON HCL 4 MG PO TABS
4.0000 mg | ORAL_TABLET | Freq: Every day | ORAL | 1 refills | Status: DC | PRN
  Filled 2021-11-06: qty 30, 30d supply, fill #0

## 2021-11-06 MED ORDER — DIAZEPAM 5 MG PO TABS
10.0000 mg | ORAL_TABLET | ORAL | Status: AC
  Administered 2021-11-06: 13:00:00 10 mg via ORAL

## 2021-11-06 MED ORDER — DIPHENHYDRAMINE HCL 25 MG PO CAPS
25.0000 mg | ORAL_CAPSULE | ORAL | Status: AC
  Administered 2021-11-06: 13:00:00 25 mg via ORAL

## 2021-11-06 MED ORDER — SODIUM CHLORIDE 0.9 % IV SOLN: INTRAVENOUS | Status: DC

## 2021-11-06 MED ORDER — CIPROFLOXACIN HCL 500 MG PO TABS
500.0000 mg | ORAL_TABLET | ORAL | Status: AC
  Administered 2021-11-06: 13:00:00 500 mg via ORAL

## 2021-11-06 MED ORDER — DIPHENHYDRAMINE HCL 25 MG PO CAPS
ORAL_CAPSULE | ORAL | Status: AC
  Filled 2021-11-06: qty 1

## 2021-11-06 MED ORDER — CIPROFLOXACIN HCL 500 MG PO TABS
ORAL_TABLET | ORAL | Status: AC
  Filled 2021-11-06: qty 1

## 2021-11-06 MED ORDER — HYDROCODONE-ACETAMINOPHEN 5-325 MG PO TABS
1.0000 | ORAL_TABLET | ORAL | 0 refills | Status: DC | PRN
  Filled 2021-11-06: qty 20, 4d supply, fill #0

## 2021-11-06 MED ORDER — DIAZEPAM 5 MG PO TABS
ORAL_TABLET | ORAL | Status: AC
  Filled 2021-11-06: qty 2

## 2021-11-06 NOTE — Op Note (Signed)
[facility] Operative Note  Treating Physician: Jasalyn Frysinger, MD  Pre-op diagnosis: 7 mm left mid-ureteral stone  Post-op diagnosis: Same   Procedure: LEFT ESWL   See [facility] OP note scanned into chart. Also because of the size, density, location and other factors that cannot be anticipated I feel this will likely be a staged procedure. This fact supersedes any indication in the scanned [facility] operative note to the contrary   

## 2021-11-06 NOTE — Discharge Instructions (Signed)

## 2021-11-06 NOTE — H&P (Signed)
See scanned [facility] documents for H&P.   

## 2021-11-07 ENCOUNTER — Encounter (HOSPITAL_BASED_OUTPATIENT_CLINIC_OR_DEPARTMENT_OTHER): Payer: Self-pay | Admitting: Urology

## 2021-11-25 DIAGNOSIS — N202 Calculus of kidney with calculus of ureter: Secondary | ICD-10-CM | POA: Diagnosis not present

## 2021-12-08 ENCOUNTER — Other Ambulatory Visit: Payer: Self-pay | Admitting: Family Medicine

## 2021-12-08 ENCOUNTER — Other Ambulatory Visit (HOSPITAL_COMMUNITY): Payer: Self-pay

## 2021-12-08 MED ORDER — HYDROCHLOROTHIAZIDE 25 MG PO TABS
25.0000 mg | ORAL_TABLET | Freq: Every day | ORAL | 3 refills | Status: DC
  Filled 2021-12-08: qty 90, 90d supply, fill #0
  Filled 2022-03-20: qty 90, 90d supply, fill #1
  Filled 2022-06-19: qty 90, 90d supply, fill #2
  Filled 2022-09-14: qty 90, 90d supply, fill #3

## 2021-12-29 ENCOUNTER — Other Ambulatory Visit (HOSPITAL_COMMUNITY): Payer: Self-pay

## 2022-03-02 ENCOUNTER — Emergency Department (HOSPITAL_COMMUNITY): Payer: 59

## 2022-03-02 ENCOUNTER — Encounter (HOSPITAL_COMMUNITY): Payer: Self-pay

## 2022-03-02 ENCOUNTER — Emergency Department (HOSPITAL_COMMUNITY)
Admission: EM | Admit: 2022-03-02 | Discharge: 2022-03-02 | Disposition: A | Discharge status: Home / Self Care | Payer: 59 | Attending: Emergency Medicine | Admitting: Emergency Medicine

## 2022-03-02 ENCOUNTER — Other Ambulatory Visit (HOSPITAL_COMMUNITY): Payer: Self-pay

## 2022-03-02 DIAGNOSIS — R2241 Localized swelling, mass and lump, right lower limb: Secondary | ICD-10-CM | POA: Insufficient documentation

## 2022-03-02 DIAGNOSIS — Z79899 Other long term (current) drug therapy: Secondary | ICD-10-CM | POA: Insufficient documentation

## 2022-03-02 DIAGNOSIS — R079 Chest pain, unspecified: Secondary | ICD-10-CM | POA: Diagnosis not present

## 2022-03-02 DIAGNOSIS — I10 Essential (primary) hypertension: Secondary | ICD-10-CM | POA: Diagnosis not present

## 2022-03-02 DIAGNOSIS — R0789 Other chest pain: Secondary | ICD-10-CM | POA: Diagnosis not present

## 2022-03-02 DIAGNOSIS — L918 Other hypertrophic disorders of the skin: Secondary | ICD-10-CM | POA: Insufficient documentation

## 2022-03-02 LAB — BASIC METABOLIC PANEL
Anion gap: 9 (ref 5–15)
BUN: 14 mg/dL (ref 8–23)
CO2: 25 mmol/L (ref 22–32)
Calcium: 9.7 mg/dL (ref 8.9–10.3)
Chloride: 105 mmol/L (ref 98–111)
Creatinine, Ser: 0.74 mg/dL (ref 0.61–1.24)
GFR, Estimated: >60 mL/min (ref >60)
Glucose, Bld: 111 mg/dL — ABNORMAL HIGH (ref 70–99)
Potassium: 3.6 mmol/L (ref 3.5–5.1)
Sodium: 139 mmol/L (ref 135–145)

## 2022-03-02 LAB — CBC
HCT: 41.8 % (ref 39.0–52.0)
Hemoglobin: 14.6 g/dL (ref 13.0–17.0)
MCH: 29.6 pg (ref 26.0–34.0)
MCHC: 34.9 g/dL (ref 30.0–36.0)
MCV: 84.6 fL (ref 80.0–100.0)
Platelets: 197 10*3/uL (ref 150–400)
RBC: 4.94 MIL/uL (ref 4.22–5.81)
RDW: 13.8 % (ref 11.5–15.5)
WBC: 5.9 10*3/uL (ref 4.0–10.5)
nRBC: 0 % (ref 0.0–0.2)

## 2022-03-02 LAB — TROPONIN I (HIGH SENSITIVITY): Troponin I (High Sensitivity): 5 ng/L (ref <18)

## 2022-03-02 MED ORDER — PANTOPRAZOLE SODIUM 20 MG PO TBEC
40.0000 mg | DELAYED_RELEASE_TABLET | Freq: Every day | ORAL | 0 refills | Status: DC
  Filled 2022-03-02: qty 28, 14d supply, fill #0

## 2022-03-02 NOTE — ED Notes (Signed)
The pt is c/o lt lay chest pain for over a week worse when he stands no n v or dizziness no known   injury  pain not getting better ?

## 2022-03-02 NOTE — ED Provider Notes (Signed)
?[facility] ?Provider Note ? ? ?CSN: [id number] ?Arrival date & time: 03/02/22  0867 ? ?  ? ?History ? ?Chief Complaint  ?Patient presents with  ? Chest Pain  ? ? ?[name] [name] is a 66 y.o. male. ? ?HPI ? ?  ? ?66yo male with history of hypertension, hyperlipidemia, kidney stone, presents with concern for left side pain.  ? ?When laying down has pain to left side-left lateral chest just under axilla ?One week off and on ?Pressure like pain at night, got tired of not sleeping ?Improves with sitting up eventually, taking rolaids 5 hours later felt better ?Last night got up and took a gas ex, rolaids and still felt uncomfortable ?Not worse with deep breaths. Not exertional ?During the day is walking around without dyspnea nor chest pain ? ?No shortness of breath, nausea, vomiting, abdominal pain, fever, cough, leg pain or swelling ?One month ago had similar pain and it went away ? ?Mom and dad had pacemakers, did not have stents ?No smoking, etoh, other drugs ?RLE always more swollen since vein procedure, no aucte changes ? ? ?Past Medical History:  ?Diagnosis Date  ? GERD (gastroesophageal reflux disease)   ? Hyperlipidemia   ? Hypertension   ? Varicose veins   ?  ?Past Surgical History:  ?Procedure Laterality Date  ? COLONOSCOPY  2010  ? ESOPHAGOGASTRIC FUNDOPLICATION  6195  ? EXTRACORPOREAL SHOCK WAVE LITHOTRIPSY Left 11/06/2021  ? Procedure: LEFT EXTRACORPOREAL SHOCK WAVE LITHOTRIPSY ([facility]);  Surgeon: Ceasar Mons, MD;  Location[facility];  Service: Urology;  Laterality: Left;  ? HERNIA REPAIR    ? MANDIBLE SURGERY    ?  ?Home Medications ?Prior to Admission medications   ?Medication Sig Start Date End Date Taking? Authorizing Provider  ?pantoprazole (PROTONIX) 20 MG tablet Take 2 tablets (40 mg total) by mouth daily for 14 days. 03/02/22 03/16/22 Yes Gareth Morgan, MD  ?amLODipine (NORVASC) 10 MG tablet Take 1 tablet (10 mg total) by mouth  at bedtime. 10/14/21   Lurline Del, DO  ?hydrochlorothiazide (HYDRODIURIL) 25 MG tablet Take 1 tablet (25 mg total) by mouth daily. 12/08/21   Lind Covert, MD  ?HYDROcodone-acetaminophen (NORCO) 5-325 MG tablet Take 1 tablet by mouth every 4 (four) hours as needed for moderate pain. 11/06/21   Ceasar Mons, MD  ?hydrOXYzine (ATARAX/VISTARIL) 25 MG tablet TAKE 1 TABLET BY MOUTH DAILY AS NEEDED FOR SITUATIONAL ANXIETY ?Patient not taking: Reported on 07/08/2021 04/18/20   Lind Covert, MD  ?ondansetron (ZOFRAN) 4 MG tablet Take 1 tablet (4 mg total) by mouth daily as needed for nausea or vomiting. 11/06/21 11/06/22  Ceasar Mons, MD  ?tamsulosin (FLOMAX) 0.4 MG CAPS capsule Take 1 capsule (0.4 mg total) by mouth daily. 11/06/21   Ceasar Mons, MD  ?Zoster Vaccine Adjuvanted [facility]) injection Inject 0.5 ml IM and Repeat in 2 months 07/08/21   Lind Covert, MD  ?   ? ?Allergies    ?Patient has no known allergies.   ? ?Review of Systems   ?Review of Systems ?See above ? ?Physical Exam ?Updated Vital Signs ?BP 140/83   Pulse 73   Temp 97.8 ?F (36.6 ?C) (Oral)   Resp 13   SpO2 92%  ?Physical Exam ?Vitals and nursing note reviewed.  ?Constitutional:   ?   General: He is not in acute distress. ?   Appearance: He is well-developed. He is not diaphoretic.  ?HENT:  ?  Head: Normocephalic and atraumatic.  ?Eyes:  ?   Conjunctiva/sclera: Conjunctivae normal.  ?Cardiovascular:  ?   Rate and Rhythm: Normal rate and regular rhythm.  ?   Heart sounds: Normal heart sounds. No murmur heard. ?  No friction rub. No gallop.  ?Pulmonary:  ?   Effort: Pulmonary effort is normal. No respiratory distress.  ?   Breath sounds: Normal breath sounds. No wheezing or rales.  ?Chest:  ?   Chest wall: No tenderness.  ?   Comments: Skin tags ?Abdominal:  ?   General: There is no distension.  ?   Palpations: Abdomen is soft.  ?   Tenderness: There is no abdominal tenderness.  There is no guarding.  ?Musculoskeletal:  ?   Cervical back: Normal range of motion.  ?   Comments: Mild right leg swelling in comparison to left, chronic and unchanged per pt ?  ?Skin: ?   General: Skin is warm and dry.  ?Neurological:  ?   Mental Status: He is alert and oriented to person, place, and time.  ? ? ?ED Results / Procedures / Treatments   ?Labs ?(all labs ordered are listed, but only abnormal results are displayed) ?Labs Reviewed  ?BASIC METABOLIC PANEL - Abnormal; Notable for the following components:  ?    Result Value  ? Glucose, Bld 111 (*)   ? All other components within normal limits  ?CBC  ?TROPONIN I (HIGH SENSITIVITY)  ?TROPONIN I (HIGH SENSITIVITY)  ? ? ?EKG ?EKG Interpretation ? ?Date/Time:  Monday March 02 2022 06:56:54 EDT ?Ventricular Rate:  79 ?PR Interval:  169 ?QRS Duration: 131 ?QT Interval:  393 ?QTC Calculation: 451 ?R Axis:   7 ?Text Interpretation: Sinus rhythm Right bundle branch block No previous ECGs available Confirmed by Gareth Morgan [phone]) on 03/02/2022 7:27:31 AM ? ?Radiology ?DG Chest 2 View ? ?Result Date: 03/02/2022 ?CLINICAL DATA:  Left lateral chest pain EXAM: CHEST - 2 VIEW COMPARISON:  2016 FINDINGS: The heart size and mediastinal contours are within normal limits. Both lungs are clear. The visualized skeletal structures are unremarkable. IMPRESSION: No active cardiopulmonary disease. Electronically Signed   By: Macy Mis M.D.   On: 03/02/2022 07:29   ? ?Procedures ?Procedures  ? ? ?Medications Ordered in ED ?Medications - No data to display ? ?ED Course/ Medical Decision Making/ A&P ?  ?                        ?Medical Decision Making ?Amount and/or Complexity of Data Reviewed ?Labs: ordered. ?Radiology: ordered. ? ?Risk ?Prescription drug management. ? ? ? ?66yo male with history of hypertension, hyperlipidemia, kidney stone, presents with concern for left side pain.  ? ?Emergent differential diagnosis for chest pain/left side pain includes pulmonary  embolus, dissection, pneumothorax, pneumonia, ACS, myocarditis, pericarditis.   ? ?EKG was done and evaluate by me and showed no acute ST changes and no signs of pericarditis.  ? ?Chest x-ray was done and evaluated by me and radiology and showed no sign of pneumonia or pneumothorax.  He has normal upper and lower extremity pulses, normal CXR, one week of symptoms present at night when laying down, pressure, history, imaging and exam not consistent with aortic dissection. Have low suspicion for PE given no dyspnea, symptoms not present during day, no new asymmetric leg swelling (had remote vein surgery with residual change), no tachycardia, no tachypnea, not pleuritic. ? ?Doubt nephrolithiasis, pyelonephritis, splenic infarct or intraabdominal etiology of symptoms given  location higher. ? ?Do not see signs of emergent pathology necessitating hospitalization or surgery and recommend continued follow up with PCP to determine etiology> Will start pantoprazole as reflux on differential of possible pain worse when laying down at night however symptoms not classic for this. Patient discharged in stable condition with understanding of reasons to return.  ? ? ? ? ? ? ? ?Final Clinical Impression(s) / ED Diagnoses ?Final diagnoses:  ?Atypical chest pain  ? ? ?Rx / DC Orders ?ED Discharge Orders   ? ?      Ordered  ?  pantoprazole (PROTONIX) 20 MG tablet  Daily       ? 03/02/22 0835  ? ?  ?  ? ?  ? ? ?  ?Gareth Morgan, MD ?03/02/22 [phone] ? ?

## 2022-03-02 NOTE — ED Notes (Signed)
Pt verbalizes understanding of discharge instructions. Opportunity for questions and answers were provided. Pt discharged from the ED.   ?

## 2022-03-02 NOTE — ED Triage Notes (Signed)
Pt reports L sided rib cage pain described as "pressure" ongoing for one week. Pt rates pain as 2/10 currently, but worsens while laying. No radiation of pain, SOB, N/V.  ?

## 2022-03-04 IMAGING — CT CT ABD-PELV W/ CM
2 of 5 series · 15 of 46 positions shown, 17 images · IV contrast (APPLIED)
Comparison: No priors.

CLINICAL DATA: 65-year-old male with history of abdominal pain,
fever and nausea. Suspected acute diverticulitis.

EXAM:
CT ABDOMEN AND PELVIS WITH CONTRAST
TECHNIQUE: Multidetector CT imaging of the abdomen and pelvis was performed
using the standard protocol following bolus administration of
intravenous contrast.
CONTRAST:  100mL OMNIPAQUE IOHEXOL 300 MG/ML  SOLN

[Series 2: abd pel w · axial · 0.84mm/px · z∈[-282,+188]mm · 12 of 106 slices shown, 14 images]
[im 6/106  soft-tissue]
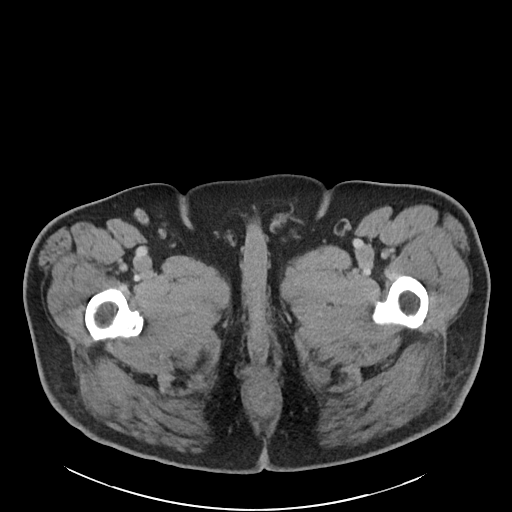
[im 6/106  bone]
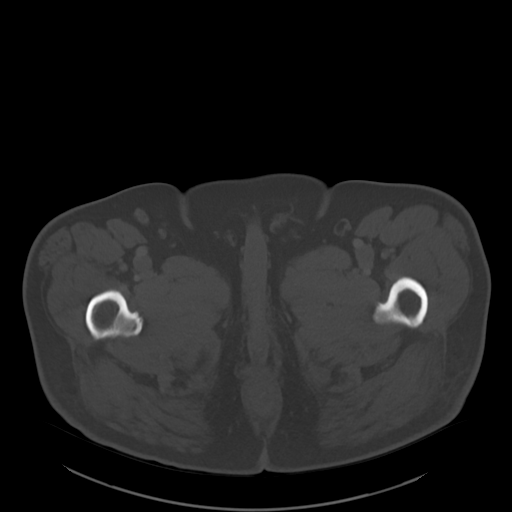
[im 18/106  soft-tissue]
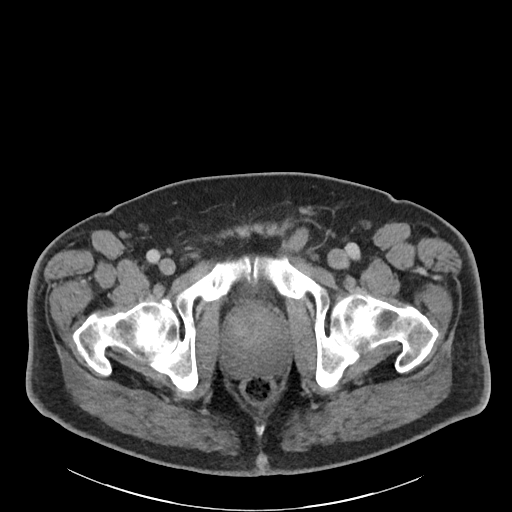
[im 24/106  soft-tissue]
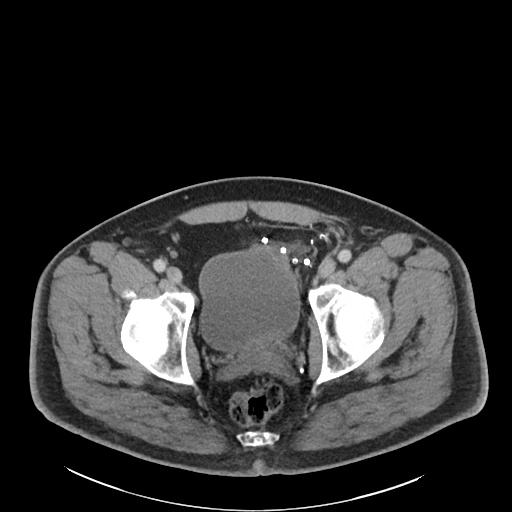
[im 30/106  soft-tissue]
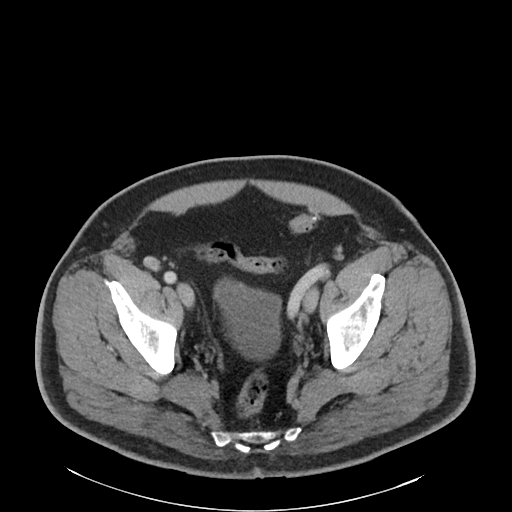
[im 41/106  soft-tissue]
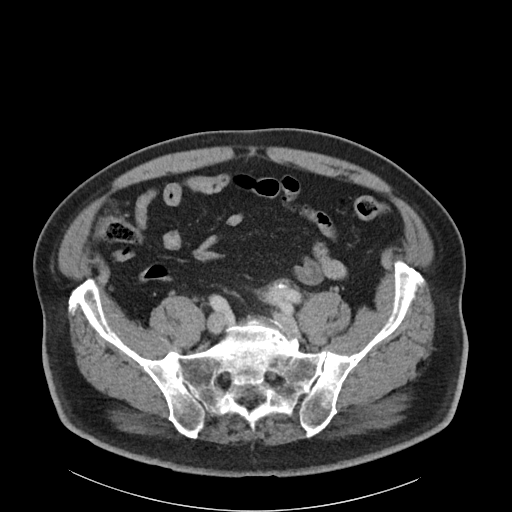
[im 47/106  soft-tissue]
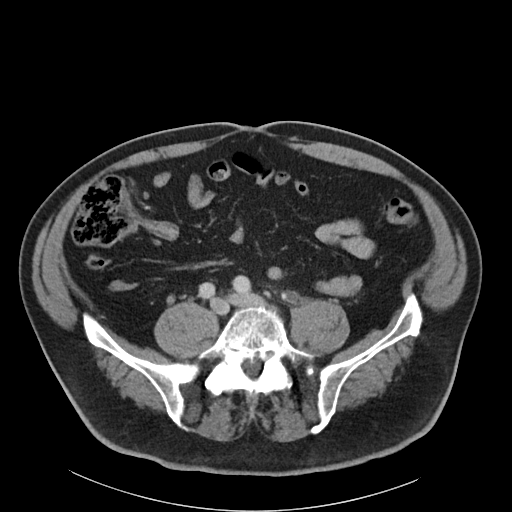
[im 59/106  soft-tissue]
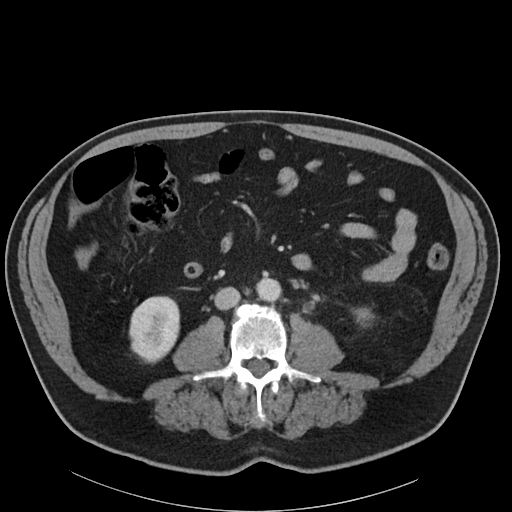
[im 65/106  soft-tissue]
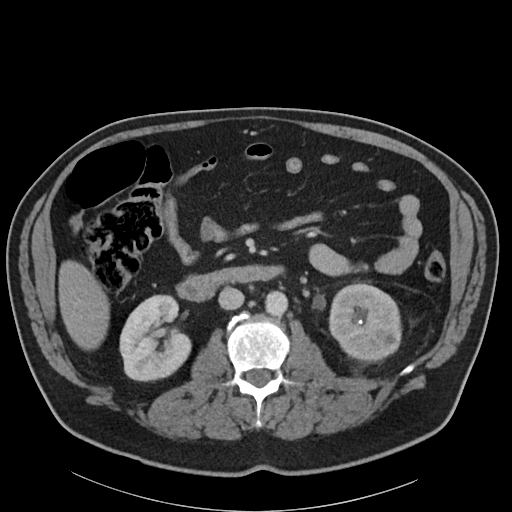
[im 76/106  soft-tissue]
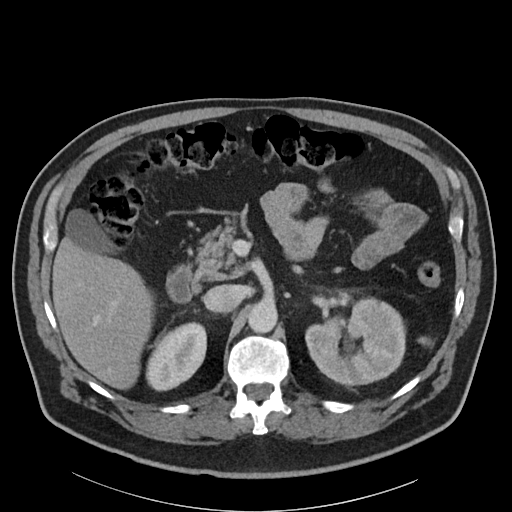
[im 76/106  bone]
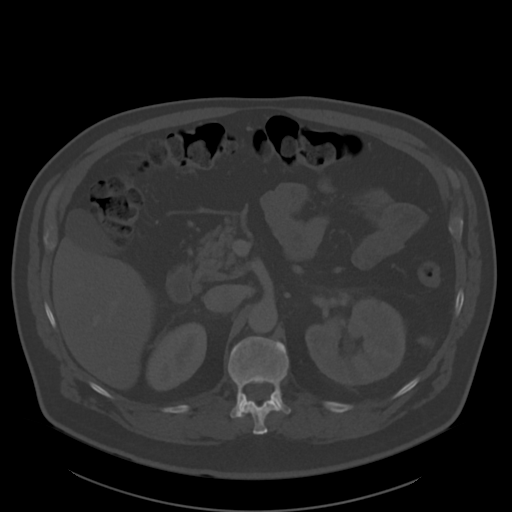
[im 82/106  soft-tissue]
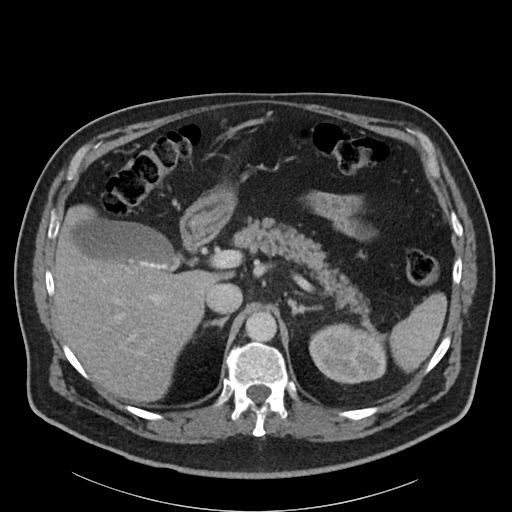
[im 88/106  soft-tissue]
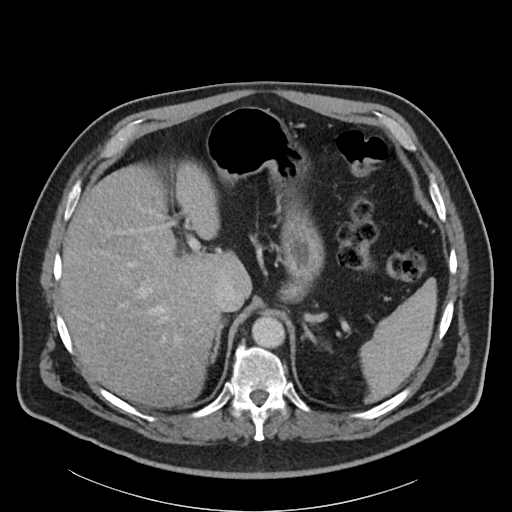
[im 100/106  soft-tissue]
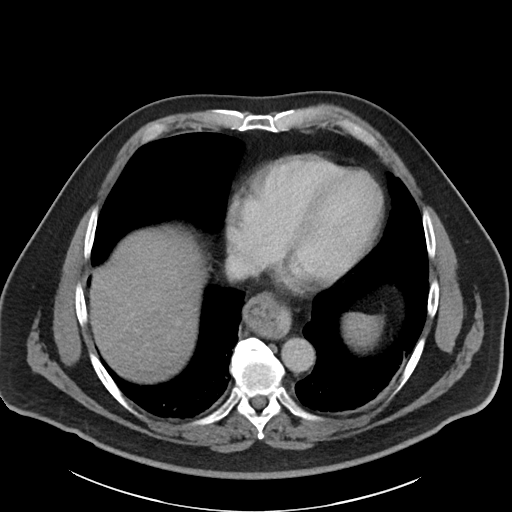

[Series 5: coronal · coronal · 0.89mm/px · 3 of 111 slices shown]
[im 37/111  soft-tissue]
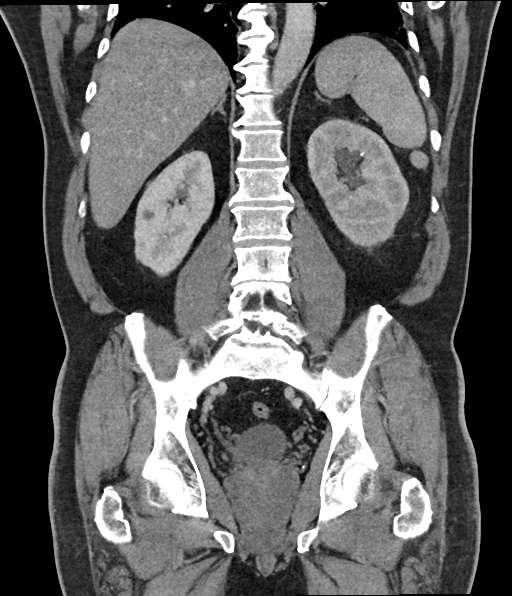
[im 49/111  soft-tissue]
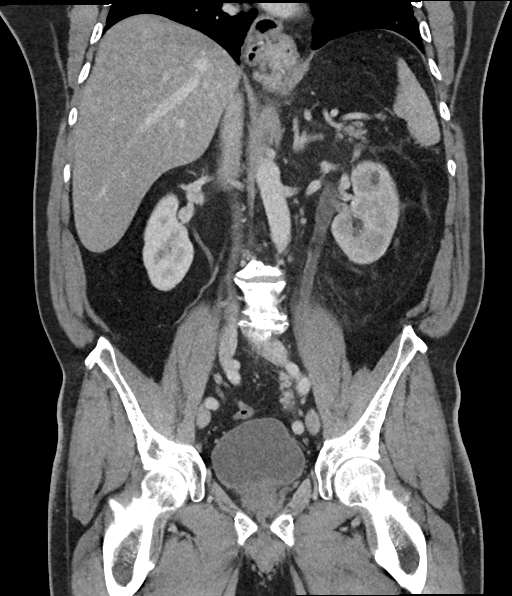
[im 62/111  soft-tissue]
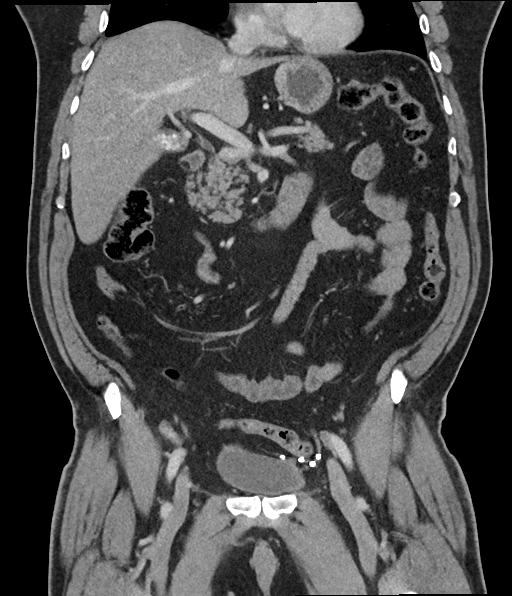

[15 of 46 positions shown; findings below may reference images not displayed]

FINDINGS: Lower chest: Moderate-sized hiatal hernia. Scarring and/or
subsegmental atelectasis in the lower lobes of the lungs
bilaterally.

Hepatobiliary: No suspicious cystic or solid hepatic lesions. No
intra or extrahepatic biliary ductal dilatation. Numerous tiny
calcified gallstones lying dependently in the gallbladder.
Gallbladder is moderately distended. No gallbladder wall thickening
or pericholecystic fluid or surrounding inflammatory changes to
suggest an acute cholecystitis at this time.

Pancreas: No pancreatic mass. No pancreatic ductal dilatation. No
pancreatic or peripancreatic fluid collections or inflammatory
changes.

Spleen: Unremarkable.

Adrenals/Urinary Tract: Several nonobstructive calculi are noted in
the collecting system of the left kidney measuring 2-4 mm in size.
Tiny 2-3 mm nonobstructive calculi are also noted in the right renal
collecting system. In addition, in the distal third of the left
ureter (axial image 67 of series 2) there is a 7 mm calculus which
is associated with mild proximal left hydroureteronephrosis and
left-sided perinephric stranding indicating obstruction at this
time. Multiple subcentimeter low-attenuation lesions in both kidneys
are too small to definitively characterize, but are statistically
likely to represent tiny cysts. No right hydroureteronephrosis.
Bilateral adrenal glands are normal in appearance. The anterolateral
aspect of the urinary bladder extends into a left-sided
inguinoscrotal hernia.

Stomach/Bowel: Postoperative changes near the gastroesophageal
junction suggesting prior Nissen fundoplication. Remainder of the
stomach is otherwise normal in appearance. No pathologic dilatation
of small bowel or colon. Redundant sigmoid colon which extends into
the right upper quadrant of the abdomen incidentally noted. No
significant diverticular disease. Normal appendix.

Vascular/Lymphatic: Aortic atherosclerosis, without evidence of
aneurysm or dissection in the abdominal or pelvic vasculature. No
lymphadenopathy noted in the abdomen or pelvis.

Reproductive: Prostate gland and seminal vesicles are unremarkable
in appearance.

Other: Postoperative changes of prior left inguinal hernia raphe.
Despite this, there is a recurrent left inguinoscrotal hernia, which
contains a portion of the anterolateral aspect of the urinary
bladder wall on the left. No significant volume of ascites. No
pneumoperitoneum.

Musculoskeletal: There are no aggressive appearing lytic or blastic
lesions noted in the visualized portions of the skeleton.
IMPRESSION: 1. In addition to multiple nonobstructive calculi in the collecting
systems of both kidneys there is a 7 mm obstructing stone in the
distal third of the left ureter with mild proximal left
hydroureteronephrosis and left-sided perinephric stranding.
2. Status post left inguinal hernial raphe with recurrent left
inguinoscrotal hernia containing a portion of the urinary bladder.
3. Status post Nissen fundoplication with recurrent moderate hiatal
hernia.
4. Cholelithiasis without evidence of acute cholecystitis.
5. Aortic atherosclerosis.
6. Additional incidental findings, as above.

## 2022-03-20 ENCOUNTER — Other Ambulatory Visit (HOSPITAL_COMMUNITY): Payer: Self-pay

## 2022-03-20 ENCOUNTER — Other Ambulatory Visit: Payer: Self-pay | Admitting: Emergency Medicine

## 2022-03-20 IMAGING — DX DG ABDOMEN 1V
2 series · 2 of 2 positions shown · non-contrast
Comparison: CT abdomen pelvis dated 10/21/2021.

CLINICAL DATA: Left ureteral stone.  Pre lithotripsy.

EXAM:
ABDOMEN - 1 VIEW

[abdomen kub (1 of 2)]
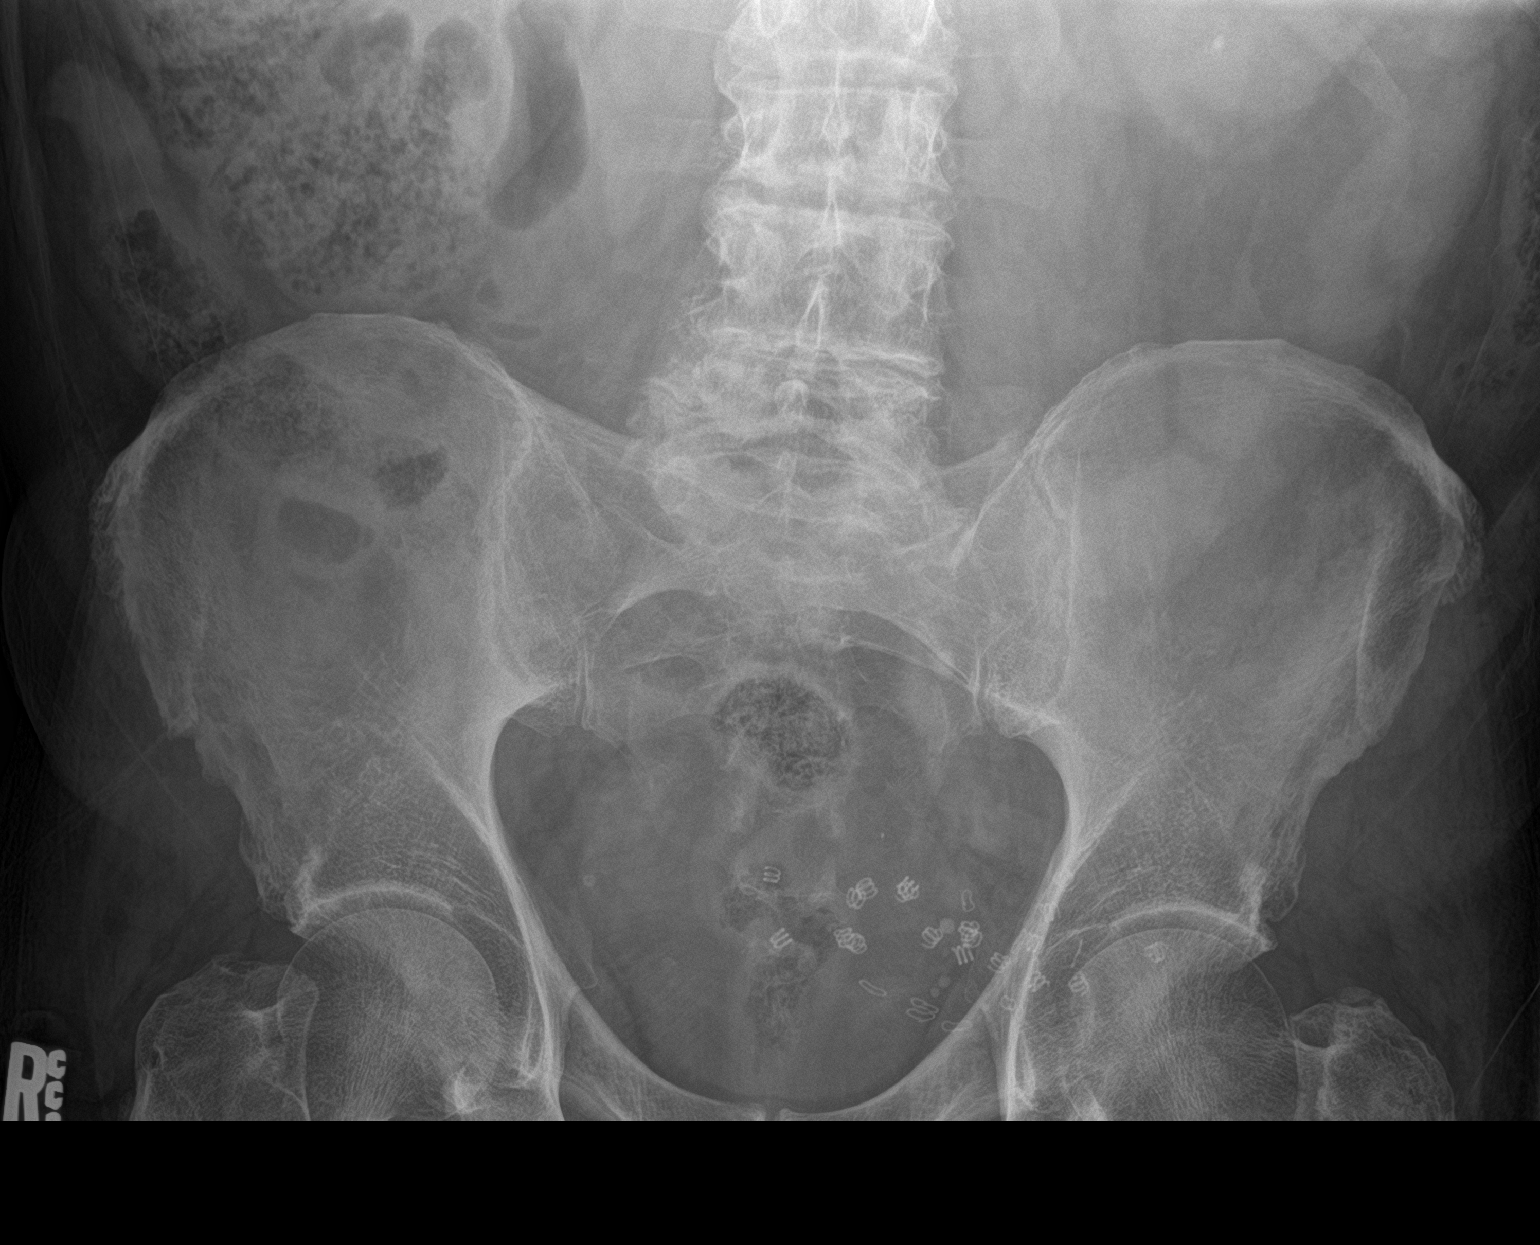

[abdomen kub (2 of 2)]
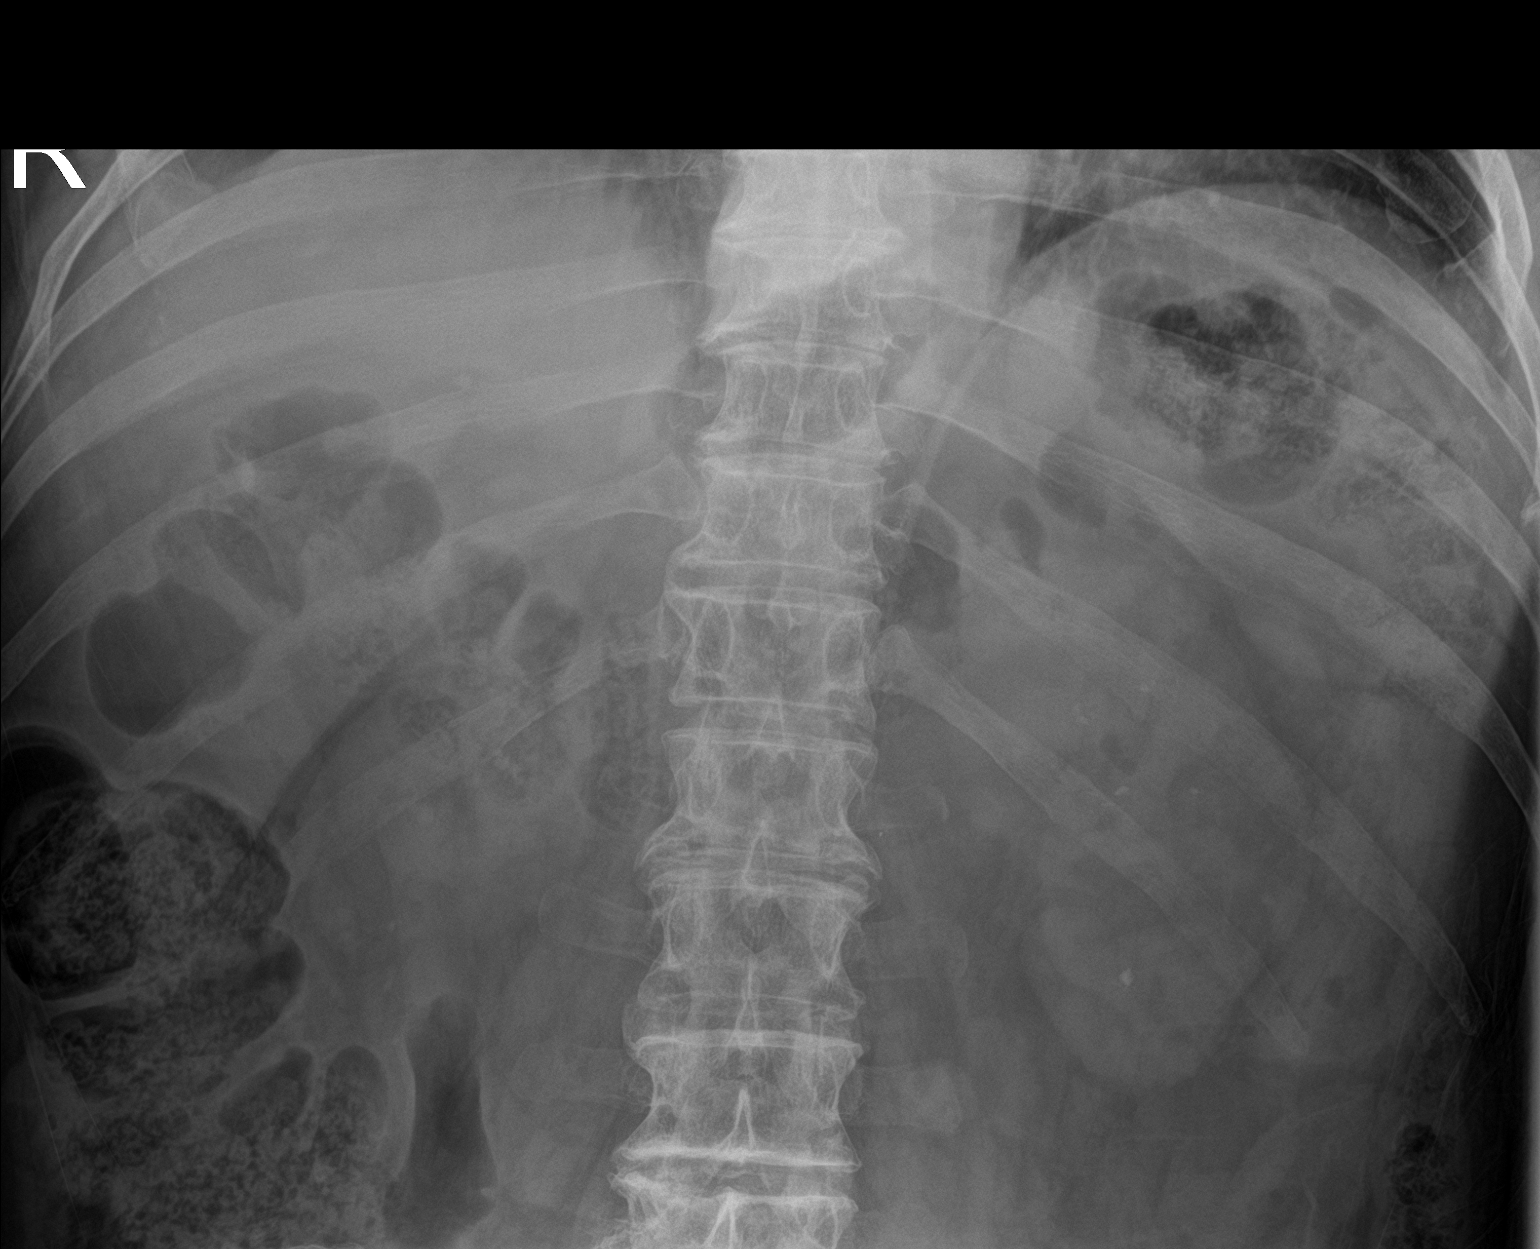

[2 of 2 positions shown; findings below may reference images not displayed]

FINDINGS: There is a 7 mm radiopaque calculus over the inferior aspect of the
left sacral ala likely corresponding to the stone in the distal left
ureter. Several smaller bilateral renal calculi noted. No bowel
dilatation or evidence of obstruction. No free air. Degenerative
changes of the spine. Left inguinal hernia repair mesh.
IMPRESSION: A 7 mm distal left ureteral stone.

## 2022-03-23 ENCOUNTER — Other Ambulatory Visit (HOSPITAL_COMMUNITY): Payer: Self-pay

## 2022-03-24 ENCOUNTER — Other Ambulatory Visit: Payer: Self-pay | Admitting: Family Medicine

## 2022-03-24 ENCOUNTER — Other Ambulatory Visit (HOSPITAL_COMMUNITY): Payer: Self-pay

## 2022-03-24 MED ORDER — PANTOPRAZOLE SODIUM 20 MG PO TBEC
20.0000 mg | DELAYED_RELEASE_TABLET | Freq: Every day | ORAL | 2 refills | Status: DC
Start: 2022-03-24 — End: 2022-06-19
  Filled 2022-03-24: qty 30, 30d supply, fill #0
  Filled 2022-04-27: qty 30, 30d supply, fill #1
  Filled 2022-05-25: qty 30, 30d supply, fill #2

## 2022-03-25 ENCOUNTER — Other Ambulatory Visit (HOSPITAL_COMMUNITY): Payer: Self-pay

## 2022-04-27 ENCOUNTER — Other Ambulatory Visit (HOSPITAL_COMMUNITY): Payer: Self-pay

## 2022-05-04 DIAGNOSIS — M9903 Segmental and somatic dysfunction of lumbar region: Secondary | ICD-10-CM | POA: Diagnosis not present

## 2022-05-04 DIAGNOSIS — M9904 Segmental and somatic dysfunction of sacral region: Secondary | ICD-10-CM | POA: Diagnosis not present

## 2022-05-04 DIAGNOSIS — M9905 Segmental and somatic dysfunction of pelvic region: Secondary | ICD-10-CM | POA: Diagnosis not present

## 2022-05-04 DIAGNOSIS — M545 Low back pain, unspecified: Secondary | ICD-10-CM | POA: Diagnosis not present

## 2022-05-11 DIAGNOSIS — M9903 Segmental and somatic dysfunction of lumbar region: Secondary | ICD-10-CM | POA: Diagnosis not present

## 2022-05-11 DIAGNOSIS — M9905 Segmental and somatic dysfunction of pelvic region: Secondary | ICD-10-CM | POA: Diagnosis not present

## 2022-05-11 DIAGNOSIS — M9904 Segmental and somatic dysfunction of sacral region: Secondary | ICD-10-CM | POA: Diagnosis not present

## 2022-05-11 DIAGNOSIS — M545 Low back pain, unspecified: Secondary | ICD-10-CM | POA: Diagnosis not present

## 2022-05-14 DIAGNOSIS — M9903 Segmental and somatic dysfunction of lumbar region: Secondary | ICD-10-CM | POA: Diagnosis not present

## 2022-05-14 DIAGNOSIS — M9904 Segmental and somatic dysfunction of sacral region: Secondary | ICD-10-CM | POA: Diagnosis not present

## 2022-05-14 DIAGNOSIS — M545 Low back pain, unspecified: Secondary | ICD-10-CM | POA: Diagnosis not present

## 2022-05-14 DIAGNOSIS — M9905 Segmental and somatic dysfunction of pelvic region: Secondary | ICD-10-CM | POA: Diagnosis not present

## 2022-05-25 ENCOUNTER — Other Ambulatory Visit (HOSPITAL_COMMUNITY): Payer: Self-pay

## 2022-05-26 ENCOUNTER — Encounter: Payer: Self-pay | Admitting: *Deleted

## 2022-05-27 ENCOUNTER — Other Ambulatory Visit (HOSPITAL_COMMUNITY): Payer: Self-pay

## 2022-05-27 DIAGNOSIS — N5201 Erectile dysfunction due to arterial insufficiency: Secondary | ICD-10-CM | POA: Diagnosis not present

## 2022-05-27 DIAGNOSIS — N200 Calculus of kidney: Secondary | ICD-10-CM | POA: Diagnosis not present

## 2022-05-27 MED ORDER — SILDENAFIL CITRATE 20 MG PO TABS
20.0000 mg | ORAL_TABLET | Freq: Every day | ORAL | 3 refills | Status: DC | PRN
Start: 2022-05-27 — End: 2023-12-03
  Filled 2022-05-27: qty 90, 18d supply, fill #0

## 2022-06-11 DIAGNOSIS — M9905 Segmental and somatic dysfunction of pelvic region: Secondary | ICD-10-CM | POA: Diagnosis not present

## 2022-06-11 DIAGNOSIS — M9903 Segmental and somatic dysfunction of lumbar region: Secondary | ICD-10-CM | POA: Diagnosis not present

## 2022-06-11 DIAGNOSIS — M9904 Segmental and somatic dysfunction of sacral region: Secondary | ICD-10-CM | POA: Diagnosis not present

## 2022-06-11 DIAGNOSIS — M545 Low back pain, unspecified: Secondary | ICD-10-CM | POA: Diagnosis not present

## 2022-06-19 ENCOUNTER — Other Ambulatory Visit: Payer: Self-pay | Admitting: Family Medicine

## 2022-06-19 ENCOUNTER — Other Ambulatory Visit (HOSPITAL_COMMUNITY): Payer: Self-pay

## 2022-06-19 MED ORDER — PANTOPRAZOLE SODIUM 20 MG PO TBEC
20.0000 mg | DELAYED_RELEASE_TABLET | Freq: Every day | ORAL | 0 refills | Status: DC
Start: 2022-06-19 — End: 2022-09-14
  Filled 2022-06-19: qty 90, 90d supply, fill #0

## 2022-08-11 ENCOUNTER — Other Ambulatory Visit: Payer: Self-pay

## 2022-08-11 ENCOUNTER — Encounter: Payer: Self-pay | Admitting: Family Medicine

## 2022-08-11 ENCOUNTER — Ambulatory Visit (INDEPENDENT_AMBULATORY_CARE_PROVIDER_SITE_OTHER): Payer: 59 | Admitting: Family Medicine

## 2022-08-11 DIAGNOSIS — R109 Unspecified abdominal pain: Secondary | ICD-10-CM

## 2022-08-11 DIAGNOSIS — I10 Essential (primary) hypertension: Secondary | ICD-10-CM

## 2022-08-11 NOTE — Assessment & Plan Note (Signed)
Again suggest to follow up with GI for colonoscopy

## 2022-08-11 NOTE — Progress Notes (Signed)
    SUBJECTIVE:   CHIEF COMPLAINT / HPI:   Hypertension Has a blood pressure cuff but does not check.  Takes medications daily.  No lightheadness or edema  Chest Pain None since ER episode.  None with exertion.  No shortness of breath     Abdomen pain - still has intermittent mild LLQ abdomen pain.  His colonoscopy was inadequate and he is supposed to schedule another     Exercise - not regularly.  but plans to working with the [facility] Does like to walk Diet - Drinks sweet drinks  Lost a good amount of weight several years ago preparing to go to [employer] - probably in end of 2023    PERTINENT  PMH / Liberal: Has an RV and like to camp   OBJECTIVE:   BP 132/82   Pulse 82   Wt 246 lb 12.8 oz (111.9 kg)   SpO2 96%   BMI 33.47 kg/m   Heart - Regular rate and rhythm.  No murmurs, gallops or rubs.    Lungs:  Normal respiratory effort, chest expands symmetrically. Lungs are clear to auscultation, no crackles or wheezes. Abdomen: soft and non-tender except mild focal LLQ pain without masses, organomegaly or hernias noted.  No guarding or rebound Extremities:  No cyanosis, edema, or deformity noted with good range of motion of all major joints.   Mobility:able to get up and down from exam table without assistance or distress   ASSESSMENT/PLAN:   HTN (hypertension) BP Readings from Last 3 Encounters:  08/11/22 132/82  03/02/22 140/83  11/06/21 (!) 138/98   At goal continue to monitor at home  Abdominal pain Again suggest to follow up with GI for colonoscopy    Annual Examination Male over 41 yo  I reviewed the following patient responses on our Physical Exam Form Tobacco use and candidacy for lung cancer or aneurysm screening Alcohol Use  Weight  Exercise  Risk for STI  Fall risk Advanced Directive Increased family cancer risk Violence risk  PHQ9 score reviewed  Blood pressure reviewed  I considered the following items based upon USPSTF  recommendations: Colon cancer screening Prostate Cancer if male at birth - discussed not screening over age 45 HIV testing:  Hepatitis C testing Cholesterol screening STI screening if high risk (Hepatitis B, Syphilis, Gonorrhea, Chlamydia) Immunizations - Influenza, Covid, [place], Pneumonia, Tetanus  See After Visit Summary for recommendations   Patient Instructions  Good to see you today - Thank you for coming in  Things we discussed today:  Your goal blood pressure is less than 140/90.  Check your blood pressure several times a week.  If regularly higher than this please let me know - either with MyChart or leaving a phone message. Next visit please bring in your blood pressure cuff.     You need to Improve your Weight Drink water and other unsweetened drinks like coffee, milk, tea- No sweet drinks Eat smaller portions of starchy and sweet foods like rice, potatoes, wheat, corn, or fruits.  Eat twice as many vegetables  Exercise at least 30 minutes every day If you wish to discuss weight loss further, please let me know or make an appointment   See your GI doctor to get a colonscocpy  Please always bring your medication bottles  Come back to see me in 6 months    Lind Covert, [place]

## 2022-08-11 NOTE — Assessment & Plan Note (Signed)
BP Readings from Last 3 Encounters:  08/11/22 132/82  03/02/22 140/83  11/06/21 (!) 138/98   At goal continue to monitor at home

## 2022-08-11 NOTE — Patient Instructions (Signed)
Good to see you today - Thank you for coming in  Things we discussed today:  Your goal blood pressure is less than 140/90.  Check your blood pressure several times a week.  If regularly higher than this please let me know - either with MyChart or leaving a phone message. Next visit please bring in your blood pressure cuff.     You need to Improve your Weight Drink water and other unsweetened drinks like coffee, milk, tea- No sweet drinks Eat smaller portions of starchy and sweet foods like rice, potatoes, wheat, corn, or fruits.  Eat twice as many vegetables  Exercise at least 30 minutes every day If you wish to discuss weight loss further, please let me know or make an appointment   See your GI doctor to get a colonscocpy  Please always bring your medication bottles  Come back to see me in 6 months

## 2022-09-14 ENCOUNTER — Other Ambulatory Visit (HOSPITAL_COMMUNITY): Payer: Self-pay

## 2022-09-14 ENCOUNTER — Other Ambulatory Visit: Payer: Self-pay

## 2022-09-14 ENCOUNTER — Other Ambulatory Visit: Payer: Self-pay | Admitting: Family Medicine

## 2022-09-14 MED ORDER — PANTOPRAZOLE SODIUM 20 MG PO TBEC
20.0000 mg | DELAYED_RELEASE_TABLET | Freq: Every day | ORAL | 2 refills | Status: DC
  Filled 2022-09-14: qty 90, 90d supply, fill #0
  Filled 2022-12-16: qty 90, 90d supply, fill #1
  Filled 2023-03-10: qty 90, 90d supply, fill #2

## 2022-09-15 ENCOUNTER — Other Ambulatory Visit (HOSPITAL_COMMUNITY): Payer: Self-pay

## 2022-09-17 ENCOUNTER — Other Ambulatory Visit: Payer: Self-pay | Admitting: Family Medicine

## 2022-09-17 ENCOUNTER — Other Ambulatory Visit (HOSPITAL_COMMUNITY): Payer: Self-pay

## 2022-09-17 MED ORDER — AMLODIPINE BESYLATE 10 MG PO TABS
10.0000 mg | ORAL_TABLET | Freq: Every day | ORAL | 3 refills | Status: DC
  Filled 2022-09-17: qty 90, 90d supply, fill #0
  Filled 2022-12-16: qty 90, 90d supply, fill #1
  Filled 2023-03-10: qty 90, 90d supply, fill #2
  Filled 2023-05-26: qty 90, 90d supply, fill #3

## 2022-11-08 ENCOUNTER — Ambulatory Visit
Admission: EM | Admit: 2022-11-08 | Discharge: 2022-11-08 | Disposition: A | Discharge status: Home / Self Care | Payer: 59 | Attending: Emergency Medicine | Admitting: Emergency Medicine

## 2022-11-08 DIAGNOSIS — J4521 Mild intermittent asthma with (acute) exacerbation: Secondary | ICD-10-CM | POA: Diagnosis not present

## 2022-11-08 DIAGNOSIS — Z1152 Encounter for screening for COVID-19: Secondary | ICD-10-CM | POA: Insufficient documentation

## 2022-11-08 DIAGNOSIS — J309 Allergic rhinitis, unspecified: Secondary | ICD-10-CM | POA: Diagnosis not present

## 2022-11-08 LAB — SARS CORONAVIRUS 2 BY RT PCR: SARS Coronavirus 2 by RT PCR: NEGATIVE

## 2022-11-08 MED ORDER — FLUTICASONE PROPIONATE 50 MCG/ACT NA SUSP: 1.0000 | Freq: Every day | NASAL | 2 refills | Status: DC

## 2022-11-08 MED ORDER — ALBUTEROL SULFATE (2.5 MG/3ML) 0.083% IN NEBU
2.5000 mg | INHALATION_SOLUTION | Freq: Once | RESPIRATORY_TRACT | Status: AC
  Administered 2022-11-08: 2.5 mg via RESPIRATORY_TRACT

## 2022-11-08 MED ORDER — METHYLPREDNISOLONE 8 MG PO TABS: 16.0000 mg | ORAL_TABLET | Freq: Every day | ORAL | 0 refills | Status: AC

## 2022-11-08 MED ORDER — PROMETHAZINE-DM 6.25-15 MG/5ML PO SYRP: 5.0000 mL | ORAL_SOLUTION | Freq: Four times a day (QID) | ORAL | 0 refills | Status: DC | PRN

## 2022-11-08 MED ORDER — ALBUTEROL SULFATE HFA 108 (90 BASE) MCG/ACT IN AERS: 2.0000 | INHALATION_SPRAY | Freq: Four times a day (QID) | RESPIRATORY_TRACT | 0 refills | Status: DC | PRN

## 2022-11-08 MED ORDER — CETIRIZINE HCL 10 MG PO TABS: 10.0000 mg | ORAL_TABLET | Freq: Every day | ORAL | 1 refills | Status: DC

## 2022-11-08 NOTE — ED Triage Notes (Signed)
Pt c/o cough that got worse yesterday.   Home interventions: OTC cough medications

## 2022-11-08 NOTE — ED Provider Notes (Signed)
[facility]    CSN: [id number] Arrival date & time: 11/08/22  1059    HISTORY   Chief Complaint  Patient presents with   Cough   HPI [name] [name] is a pleasant, well-appearing 66 y.o. male with a past medical history of hypertension, obesity and GERD who presents to urgent care today complaining of a "severe cough".  Patient has elevated heart rate but otherwise normal vital signs on arrival today. Patient states the cough is new and became significantly worse yesterday.  Patient states has been taking over-the-counter cough medications without meaningful relief of his symptoms.  Patient states he has a similar cough around this time of year every year.  Patient reports having COVID-19 twice, last episode was in November of last year.  Patient states has been vaccinated twice for COVID-19.  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.  Patient states he does not frequently have patient interaction.  Patient denies known sick contacts.  The history is provided by the patient.   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   HTN (hypertension) 03/26/2016   Varicose veins of right lower extremities with other complications 50/93/2671   Varicose veins of leg with complications 24/58/0998   Obesity (BMI 30-39.9) 11/01/2013   GERD (gastroesophageal reflux disease) 03/13/2013   Past Surgical History:  Procedure Laterality Date   COLONOSCOPY  2010   ESOPHAGOGASTRIC FUNDOPLICATION  3382   EXTRACORPOREAL SHOCK WAVE LITHOTRIPSY Left 11/06/2021   Procedure: LEFT EXTRACORPOREAL SHOCK WAVE LITHOTRIPSY (ESWL);  Surgeon: Ceasar Mons, MD;  Location: [facility];  Service: Urology;  Laterality: Left;   HERNIA REPAIR      MANDIBLE SURGERY      Home Medications    Prior to Admission medications   Medication Sig Start Date End Date Taking? Authorizing Provider  amLODipine (NORVASC) 10 MG tablet Take 1 tablet (10 mg total) by mouth at bedtime. 09/17/22   Lind Covert, MD  hydrochlorothiazide (HYDRODIURIL) 25 MG tablet Take 1 tablet (25 mg total) by mouth daily. 12/08/21   Lind Covert, MD  pantoprazole (PROTONIX) 20 MG tablet Take 1 tablet (20 mg total) by mouth daily. 09/14/22   Lind Covert, MD  sildenafil (REVATIO) 20 MG tablet Take 1-5 tablets (20-100 mg total) by mouth daily as needed for erectile dysfunction Patient not taking: Reported on 08/11/2022 05/27/22       Family History Family History  Problem Relation Age of Onset   Diabetes Mother    Hypertension Mother    Heart disease Mother    Varicose Veins Mother    Diabetes Father    Hypertension Father    Varicose Veins Father    Heart attack Father    Cancer Sister    Diabetes Sister    Colon polyps Neg Hx    Colon cancer Neg Hx    Esophageal cancer Neg Hx    Rectal cancer Neg Hx    Stomach cancer Neg Hx    Social History Social History   Tobacco Use   Smoking status: Never   Smokeless tobacco: Never  Vaping Use   Vaping Use: Never used  Substance Use Topics   Alcohol use: Yes    Alcohol/week:  1.0 standard drink of alcohol    Types: 1 Cans of beer per week   Drug use: No   Allergies   Patient has no known allergies.  Review of Systems Review of Systems Pertinent findings revealed after performing a 14 point review of systems has been noted in the history of present illness.  Physical Exam Triage Vital Signs ED Triage Vitals  Enc Vitals Group     BP 10/17/21 0827 (!) 147/82     Pulse Rate 10/17/21 0827 72     Resp 10/17/21 0827 18     Temp 10/17/21 0827 98.3 F (36.8 C)     Temp Source 10/17/21 0827 Oral     SpO2 10/17/21 0827 98 %     Weight --      Height --      Head Circumference --       Peak Flow --      Pain Score 10/17/21 0826 5     Pain Loc --      Pain Edu? --      Excl. in [place]? --   No data found.  Updated Vital Signs BP 139/86 (BP Location: Left Arm)   Pulse (!) 103   Temp 98.6 F (37 C) (Oral)   Resp 18   SpO2 97%   Physical Exam Vitals and nursing note reviewed.  Constitutional:      General: He is not in acute distress.    Appearance: Normal appearance. He is not ill-appearing.  HENT:     Head: Normocephalic and atraumatic.     Salivary Glands: Right salivary gland is not diffusely enlarged or tender. Left salivary gland is not diffusely enlarged or tender.     Right Ear: Ear canal and external ear normal. No drainage. A middle ear effusion is present. There is no impacted cerumen. Tympanic membrane is bulging. Tympanic membrane is not injected or erythematous.     Left Ear: Ear canal and external ear normal. No drainage. A middle ear effusion is present. There is no impacted cerumen. Tympanic membrane is bulging. Tympanic membrane is not injected or erythematous.     Ears:     Comments: Bilateral EACs normal, both TMs bulging with clear fluid    Nose: Rhinorrhea present. No nasal deformity, septal deviation, signs of injury, nasal tenderness, mucosal edema or congestion. Rhinorrhea is clear.     Right Nostril: Occlusion present. No foreign body, epistaxis or septal hematoma.     Left Nostril: Occlusion present. No foreign body, epistaxis or septal hematoma.     Right Turbinates: Enlarged, swollen and pale.     Left Turbinates: Enlarged, swollen and pale.     Right Sinus: No maxillary sinus tenderness or frontal sinus tenderness.     Left Sinus: No maxillary sinus tenderness or frontal sinus tenderness.     Mouth/Throat:     Lips: Pink. No lesions.     Mouth: Mucous membranes are moist. No oral lesions.     Pharynx: Oropharynx is clear. Uvula midline. No posterior oropharyngeal erythema or uvula swelling.     Tonsils: No tonsillar exudate. 0 on the  right. 0 on the left.     Comments: Postnasal drip Eyes:     General: Lids are normal.        Right eye: No discharge.        Left eye: No discharge.     Extraocular Movements: Extraocular movements intact.     Conjunctiva/sclera: Conjunctivae normal.  Right eye: Right conjunctiva is not injected.     Left eye: Left conjunctiva is not injected.  Neck:     Trachea: Trachea and phonation normal.  Cardiovascular:     Rate and Rhythm: Normal rate and regular rhythm.     Pulses: Normal pulses.     Heart sounds: Normal heart sounds. No murmur heard.    No friction rub. No gallop.  Pulmonary:     Effort: Accessory muscle usage and prolonged expiration present. No tachypnea, bradypnea, respiratory distress or retractions.     Breath sounds: No stridor, decreased air movement or transmitted upper airway sounds. Examination of the right-upper field reveals decreased breath sounds. Examination of the left-upper field reveals decreased breath sounds. Examination of the right-middle field reveals decreased breath sounds. Examination of the left-middle field reveals decreased breath sounds. Decreased breath sounds present. No wheezing, rhonchi or rales.     Comments: Patient's O2 sats dropped from 94% to 91% during auscultation and returned to 96% after we returned to conversing. Chest:     Chest wall: No tenderness.  Musculoskeletal:        General: Normal range of motion.     Cervical back: Normal range of motion and neck supple. Normal range of motion.  Lymphadenopathy:     Cervical: No cervical adenopathy.  Skin:    General: Skin is warm and dry.     Findings: No erythema or rash.  Neurological:     General: No focal deficit present.     Mental Status: He is alert and oriented to person, place, and time.  Psychiatric:        Mood and Affect: Mood normal.        Behavior: Behavior normal.     Visual Acuity Right Eye Distance:   Left Eye Distance:   Bilateral Distance:    Right  Eye Near:   Left Eye Near:    Bilateral Near:     [facility] Couse / Diagnostics / Procedures:     Radiology No results found.  Procedures Procedures (including critical care time) EKG  Pending results:  Labs Reviewed  SARS CORONAVIRUS 2 BY RT PCR    Medications Ordered in UC: Medications  albuterol (PROVENTIL) (2.5 MG/3ML) 0.083% nebulizer solution 2.5 mg (2.5 mg Nebulization Given 11/08/22 1318)    UC Diagnoses / Final Clinical Impressions(s)   I have reviewed the triage vital signs and the nursing notes.  Pertinent labs & imaging results that were available during my care of the patient were reviewed by me and considered in my medical decision making (see chart for details).    Final diagnoses:  Encounter for screening laboratory testing for COVID-19 virus  Allergic rhinitis, unspecified seasonality, unspecified trigger  Mild intermittent reactive airway disease with acute exacerbation   COVID-19 PCR testing performed, will notify patient of results once received, patient would benefit from Paxlovid if positive.  Last GFR March 02, 2022 was greater than 60.  Repeat auscultation after nebulized albuterol treatment revealed improved breath sounds, patient advised to continue albuterol 2 puffs 4 times daily.  Repeat auscultation also revealed mild wheezing so patient provided with a prescription for methylprednisolone 60 mg once daily for 3 days to resolve wheezing.  Physical exam revealed findings consistent with allergic rhinitis, patient provided with cetirizine and Flonase to take as directed below.  Patient provided with Promethazine DM for nighttime cough.  Patient advised to monitor himself closely for signs of worsening cough, shortness of breath, altered mental status  and to return for evaluation if any of these occur.  Please see discharge instructions below for further details of plan of care.  ED Prescriptions     Medication Sig Dispense Auth. Provider   cetirizine  (ZYRTEC ALLERGY) 10 MG tablet Take 1 tablet (10 mg total) by mouth at bedtime. 90 tablet Lynden Oxford Scales, PA-C   fluticasone (FLONASE) 50 MCG/ACT nasal spray Place 1 spray into both nostrils daily. Begin by using 2 sprays in each nare daily for 3 to 5 days, then decrease to 1 spray in each nare daily. 15.8 mL Lynden Oxford Scales, PA-C   albuterol (VENTOLIN HFA) 108 (90 Base) MCG/ACT inhaler Inhale 2 puffs into the lungs every 6 (six) hours as needed for wheezing or shortness of breath (Cough). 36 g Lynden Oxford Scales, PA-C   methylPREDNISolone (MEDROL) 8 MG tablet Take 2 tablets (16 mg total) by mouth daily for 3 days. 6 tablet Lynden Oxford Scales, PA-C   promethazine-dextromethorphan (PROMETHAZINE-DM) 6.25-15 MG/5ML syrup Take 5 mLs by mouth 4 (four) times daily as needed for cough. 118 mL Lynden Oxford Scales, PA-C      PDMP not reviewed this encounter.  Disposition Upon Discharge:  Condition: stable for discharge home Home: take medications as prescribed; routine discharge instructions as discussed; follow up as advised.  Patient presented with an acute illness with associated systemic symptoms and significant discomfort requiring urgent management. In my opinion, this is a condition that a prudent lay person (someone who possesses an average knowledge of health and medicine) may potentially expect to result in complications if not addressed urgently such as respiratory distress, impairment of bodily function or dysfunction of bodily organs.   Routine symptom specific, illness specific and/or disease specific instructions were discussed with the patient and/or caregiver at length.   As such, the patient has been evaluated and assessed, work-up was performed and treatment was provided in alignment with urgent care protocols and evidence based medicine.  Patient/parent/caregiver has been advised that the patient may require follow up for further testing and treatment if the  symptoms continue in spite of treatment, as clinically indicated and appropriate.  If the patient was tested for COVID-19, Influenza and/or RSV, then the patient/parent/guardian was advised to isolate at home pending the results of his/her diagnostic coronavirus test and potentially longer if they're positive. I have also advised pt that if his/her COVID-19 test returns positive, it's recommended to self-isolate for at least 10 days after symptoms first appeared AND until fever-free for 24 hours without fever reducer AND other symptoms have improved or resolved. Discussed self-isolation recommendations as well as instructions for household member/close contacts as per the [facility] and [place] DHHS, and also gave patient the [place] packet with this information.  Patient/parent/caregiver has been advised to return to the [facility] or PCP in 3-5 days if no better; to PCP or the Emergency Department if new signs and symptoms develop, or if the current signs or symptoms continue to change or worsen for further workup, evaluation and treatment as clinically indicated and appropriate  The patient will follow up with their current PCP if and as advised. If the patient does not currently have a PCP we will assist them in obtaining one.   The patient may need specialty follow up if the symptoms continue, in spite of conservative treatment and management, for further workup, evaluation, consultation and treatment as clinically indicated and appropriate.  Patient/parent/caregiver verbalized understanding and agreement of plan as discussed.  All questions were addressed during  visit.  Please see discharge instructions below for further details of plan.  Discharge Instructions:   Discharge Instructions      Please read below to learn more about the medications, dosages and frequencies that I recommend to help alleviate your symptoms and to get you feeling better soon:   Medrol (methylprednisolone): This is a steroid that will  significantly calm your upper and lower airways, please take the daily recommended quantity of tablets daily with your breakfast meal starting tomorrow morning until the prescription is complete.      Zyrtec (cetirizine): This is an excellent second-generation antihistamine that helps to reduce respiratory inflammatory response to environmental allergens.  In some patients, this medication can cause daytime sleepiness so I recommend that you take 1 tablet daily at bedtime.     Flonase (fluticasone): This is a steroid nasal spray that you use once daily, 1 spray in each nare.  This medication does not work well if you decide to use it only used as you feel you need to, it works best used on a daily basis.  After 3 to 5 days of use, you will notice significant reduction of the inflammation and mucus production that is currently being caused by exposure to allergens, whether seasonal or environmental.  The most common side effect of this medication is nosebleeds.  If you experience a nosebleed, please discontinue use for 1 week, then feel free to resume.  I have provided you with a prescription.     ProAir, Ventolin, Proventil (albuterol): This inhaled medication contains a short acting beta agonist bronchodilator.  This medication works on the smooth muscle that opens and constricts of your airways by relaxing the muscle.  The result of relaxation of the smooth muscle is increased air movement and improved work of breathing.  This is a short acting medication that can be used every 4-6 hours as needed for increased work of breathing, shortness of breath, wheezing and excessive coughing.  I have provided you with a prescription.    Promethazine DM: Promethazine is both a nasal decongestant and an antinausea medication that makes most patients feel fairly sleepy.  The DM is dextromethorphan, a cough suppressant found in many over-the-counter cough medications.  Please take 5 mL before bedtime to minimize your  cough which will help you sleep better.  I have sent a prescription for this medication to your pharmacy.   Please follow-up within the next 5-7 days either with your primary care provider or urgent care if your symptoms do not resolve.  If you do not have a primary care provider, we will assist you in finding one.        Thank you for visiting urgent care today.  We appreciate the opportunity to participate in your care.   If you find that your health insurance will not pay for allergy medications, please consider downloading the GoodRx app and using to get a better price than the "off the shelf" price.          This office note has been dictated using Museum/gallery curator.  Unfortunately, this method of dictation can sometimes lead to typographical or grammatical errors.  I apologize for your inconvenience in advance if this occurs.  Please do not hesitate to reach out to me if clarification is needed.      Lynden Oxford Scales, PA-C 11/10/22 1026

## 2022-11-08 NOTE — Discharge Instructions (Addendum)
Please read below to learn more about the medications, dosages and frequencies that I recommend to help alleviate your symptoms and to get you feeling better soon:   Medrol (methylprednisolone): This is a steroid that will significantly calm your upper and lower airways, please take the daily recommended quantity of tablets daily with your breakfast meal starting tomorrow morning until the prescription is complete.      Zyrtec (cetirizine): This is an excellent second-generation antihistamine that helps to reduce respiratory inflammatory response to environmental allergens.  In some patients, this medication can cause daytime sleepiness so I recommend that you take 1 tablet daily at bedtime.     Flonase (fluticasone): This is a steroid nasal spray that you use once daily, 1 spray in each nare.  This medication does not work well if you decide to use it only used as you feel you need to, it works best used on a daily basis.  After 3 to 5 days of use, you will notice significant reduction of the inflammation and mucus production that is currently being caused by exposure to allergens, whether seasonal or environmental.  The most common side effect of this medication is nosebleeds.  If you experience a nosebleed, please discontinue use for 1 week, then feel free to resume.  I have provided you with a prescription.     ProAir, Ventolin, Proventil (albuterol): This inhaled medication contains a short acting beta agonist bronchodilator.  This medication works on the smooth muscle that opens and constricts of your airways by relaxing the muscle.  The result of relaxation of the smooth muscle is increased air movement and improved work of breathing.  This is a short acting medication that can be used every 4-6 hours as needed for increased work of breathing, shortness of breath, wheezing and excessive coughing.  I have provided you with a prescription.    Promethazine DM: Promethazine is both a nasal decongestant  and an antinausea medication that makes most patients feel fairly sleepy.  The DM is dextromethorphan, a cough suppressant found in many over-the-counter cough medications.  Please take 5 mL before bedtime to minimize your cough which will help you sleep better.  I have sent a prescription for this medication to your pharmacy.   Please follow-up within the next 5-7 days either with your primary care provider or urgent care if your symptoms do not resolve.  If you do not have a primary care provider, we will assist you in finding one.        Thank you for visiting urgent care today.  We appreciate the opportunity to participate in your care.   If you find that your health insurance will not pay for allergy medications, please consider downloading the GoodRx app and using to get a better price than the "off the shelf" price.

## 2022-12-07 NOTE — Progress Notes (Unsigned)
    SUBJECTIVE:   CHIEF COMPLAINT / HPI:   R Thigh mass  Feels a vague fullness in R upper inner thigh.  No pain and does not keep him from doing anything.  No redness or swelling of the lower leg.  No urinary or bowel symptoms.  No other masses.  No history of trauma.  He is unsure exactly when began.  Has history of varicose veins of lower legs   Abdomen pain - intermittent LUQ has not followed up with GI yet.  Nothing severe No nausea and vomiting or bleeding   OBJECTIVE:   BP 128/86   Pulse 81   Ht '5\' 11"'$  (1.803 m)   Wt 250 lb (113.4 kg)   SpO2 98%   BMI 34.87 kg/m   R leg - very vague nontender fullness upper thigh at groin crease.  Does not seem connected to thigh muscles when these are contracted.  Does not change with valsalva or standing.  No groin adenopathy.   Moderate varicosities in lower leg   ASSESSMENT/PLAN:   Right leg swelling Assessment & Plan: Focal upper leg near groin crease.  Hard to discern on exam.  Most consistent with a varicosity as does not seem to be part of muscle or a hernia.  Will obtain US to hopefully determine cause   Orders: -     VAS [place] LOWER EXTREMITY VENOUS (DVT); Future  Abdominal pain, unspecified abdominal location Assessment & Plan: Mild intermittent persistent.  No red flags for obstruction or cancer.  Encouraged him to follow up with GI as he needs a colonoscopy.        Patient Instructions  Good to see you today - Thank you for coming in  Things we discussed today:  R Thigh mass - will schedule an [place] and will let you know the results If gets painful or more swollen or any new symptoms let me know  See GI for a repeat colonoscopy  [place], MD [place]

## 2022-12-08 ENCOUNTER — Ambulatory Visit (HOSPITAL_COMMUNITY)
Admission: RE | Admit: 2022-12-08 | Discharge: 2022-12-08 | Disposition: A | Discharge status: Home / Self Care | Payer: 59 | Source: Ambulatory Visit | Attending: Family Medicine | Admitting: Family Medicine

## 2022-12-08 ENCOUNTER — Ambulatory Visit: Payer: 59 | Admitting: Family Medicine

## 2022-12-08 ENCOUNTER — Encounter: Payer: Self-pay | Admitting: Family Medicine

## 2022-12-08 VITALS — BP 128/86 | HR 81 | Ht 71.0 in | Wt 250.0 lb

## 2022-12-08 DIAGNOSIS — R109 Unspecified abdominal pain: Secondary | ICD-10-CM

## 2022-12-08 DIAGNOSIS — M7989 Other specified soft tissue disorders: Secondary | ICD-10-CM

## 2022-12-08 HISTORY — DX: Other specified soft tissue disorders: M79.89

## 2022-12-08 NOTE — Patient Instructions (Addendum)
Good to see you today - Thank you for coming in  Things we discussed today:  R Thigh mass - will schedule an [place] and will let you know the results If gets painful or more swollen or any new symptoms let me know  See GI for a repeat colonoscopy  Happy Holidays7

## 2022-12-08 NOTE — Assessment & Plan Note (Signed)
Focal upper leg near groin crease.  Hard to discern on exam.  Most consistent with a varicosity as does not seem to be part of muscle or a hernia.  Will obtain US to hopefully determine cause

## 2022-12-08 NOTE — Assessment & Plan Note (Signed)
Mild intermittent persistent.  No red flags for obstruction or cancer.  Encouraged him to follow up with GI as he needs a colonoscopy.

## 2022-12-16 ENCOUNTER — Other Ambulatory Visit: Payer: Self-pay | Admitting: Family Medicine

## 2022-12-16 ENCOUNTER — Other Ambulatory Visit (HOSPITAL_COMMUNITY): Payer: Self-pay

## 2022-12-16 MED ORDER — HYDROCHLOROTHIAZIDE 25 MG PO TABS
25.0000 mg | ORAL_TABLET | Freq: Every day | ORAL | 3 refills | Status: DC
  Filled 2022-12-16: qty 90, 90d supply, fill #0
  Filled 2023-03-11: qty 90, 90d supply, fill #1
  Filled 2023-05-26: qty 90, 90d supply, fill #2
  Filled 2023-09-09: qty 90, 90d supply, fill #3

## 2023-01-04 ENCOUNTER — Ambulatory Visit (INDEPENDENT_AMBULATORY_CARE_PROVIDER_SITE_OTHER): Payer: Commercial Managed Care - PPO | Admitting: Student

## 2023-01-04 ENCOUNTER — Other Ambulatory Visit (HOSPITAL_COMMUNITY): Payer: Self-pay

## 2023-01-04 VITALS — BP 125/87 | HR 97 | Ht 71.0 in | Wt 240.2 lb

## 2023-01-04 DIAGNOSIS — J029 Acute pharyngitis, unspecified: Secondary | ICD-10-CM

## 2023-01-04 DIAGNOSIS — R051 Acute cough: Secondary | ICD-10-CM

## 2023-01-04 MED ORDER — BENZONATATE 100 MG PO CAPS
100.0000 mg | ORAL_CAPSULE | Freq: Three times a day (TID) | ORAL | 0 refills | Status: DC | PRN
  Filled 2023-01-04: qty 20, 7d supply, fill #0

## 2023-01-04 NOTE — Progress Notes (Signed)
    SUBJECTIVE:   CHIEF COMPLAINT / HPI: Sore throat  Sore throat since Friday. No known sick contacts. Does have nasal drainage. UC says he had allergies - cold season cough-still takes flonase and zyrtec currently. No shortness of breaht or wheezing. OTC cold medicine for sore throat 3 days, not helping much.  Does have GERD and takes Protonix daily.  He says that during the cold seasons he starts to have a cough.  Says that the cough is worse in the morning and then it gets better and he does not have it during the day.  But that the sore throat is new for him to call out of work due to the pain.  Patient declines any COVID/flu swabs today.  Denies any fevers, ear pain, shortness of breath, nausea/vomiting, diarrhea, rash or swelling  PERTINENT  PMH / PSH: Hypertension, GERD  OBJECTIVE:   BP 125/87   Pulse 97   Ht '5\' 11"'$  (1.803 m)   Wt 240 lb 3.2 oz (109 kg)   SpO2 99%   BMI 33.50 kg/m   General: Well appearing, NAD, awake, alert, responsive to questions Head: Normocephalic atraumatic, no anterior lymphadenopathy, no exudates on oropharyngeal exam, mild erythema, moist mucous membranes CV: Regular rate and rhythm no murmurs rubs or gallops Respiratory: Clear to ausculation bilaterally, no wheezes rales or crackles, chest rises symmetrically,  no increased work of breathing Abdomen: Soft, non-tender, non-distended, normoactive bowel sounds  Extremities: Moves upper and lower extremities freely Neuro: No focal deficits Skin: No rashes or lesions visualized   ASSESSMENT/PLAN:   Sore Throat Patient overall well-appearing on examination today.  Says that the cough is worse in the morning.  Sore throat believe most likely related to viral infection although patient does not have any diarrhea/other symptoms except for nasal congestion.  I discussed importance of continuing allergy medication as well as Flonase which I discussed proper administration of to him.  I discussed if not  improving by the end of the week or worsening to please come back in.  Could consider GERD component of sore throat as patient is on Protonix 20 mg, could consider increasing if this continues.  Does not have typical picture of strep throat given no exudates on examination, no anterior cervical lymphadenopathy, and presence of cough.  No fevers, productive cough or issues on respiratory examination that would make me think a bacterial pneumonia. -Continue Flonase -Continue Zyrtec -Continue Protonix 20 mg -Work note provided for today and tomorrow - benzonatate (TESSALON PERLES) 100 MG capsule; Take 1 capsule (100 mg total) by mouth 3 (three) times daily as needed for cough.  Dispense: 20 capsule; Refill: 0  -If not improving by end of the week, patient to return  Gerrit Heck, MD [place]

## 2023-01-04 NOTE — Patient Instructions (Signed)
It was great to see you! Thank you for allowing me to participate in your care!   Our plans for today:  - I believe this is most likely a virus that you got that is causing a sore throat - Please take tylenol as needed for pain control - Continue flonase and zyrtec -Continue your protonix -If not improving by end of the week please come back  Take care and seek immediate care sooner if you develop any concerns.  Gerrit Heck, MD

## 2023-03-11 ENCOUNTER — Other Ambulatory Visit (HOSPITAL_COMMUNITY): Payer: Self-pay

## 2023-03-11 ENCOUNTER — Other Ambulatory Visit: Payer: Self-pay

## 2023-05-27 ENCOUNTER — Other Ambulatory Visit (HOSPITAL_COMMUNITY): Payer: Self-pay

## 2023-06-12 ENCOUNTER — Other Ambulatory Visit: Payer: Self-pay | Admitting: Family Medicine

## 2023-06-14 ENCOUNTER — Other Ambulatory Visit (HOSPITAL_COMMUNITY): Payer: Self-pay

## 2023-06-14 MED ORDER — PANTOPRAZOLE SODIUM 20 MG PO TBEC
20.0000 mg | DELAYED_RELEASE_TABLET | Freq: Every day | ORAL | 2 refills | Status: DC
  Filled 2023-06-14: qty 90, 90d supply, fill #0
  Filled 2023-09-09: qty 90, 90d supply, fill #1
  Filled 2023-12-03: qty 90, 90d supply, fill #2

## 2023-08-02 DIAGNOSIS — H52223 Regular astigmatism, bilateral: Secondary | ICD-10-CM | POA: Diagnosis not present

## 2023-08-31 ENCOUNTER — Ambulatory Visit (INDEPENDENT_AMBULATORY_CARE_PROVIDER_SITE_OTHER): Payer: Commercial Managed Care - PPO | Admitting: Family Medicine

## 2023-08-31 ENCOUNTER — Other Ambulatory Visit (HOSPITAL_COMMUNITY): Payer: Self-pay

## 2023-08-31 ENCOUNTER — Other Ambulatory Visit: Payer: Self-pay

## 2023-08-31 VITALS — BP 130/88 | HR 86 | Wt 243.0 lb

## 2023-08-31 DIAGNOSIS — Z23 Encounter for immunization: Secondary | ICD-10-CM

## 2023-08-31 DIAGNOSIS — I10 Essential (primary) hypertension: Secondary | ICD-10-CM

## 2023-08-31 DIAGNOSIS — M7989 Other specified soft tissue disorders: Secondary | ICD-10-CM | POA: Diagnosis not present

## 2023-08-31 DIAGNOSIS — Z13220 Encounter for screening for lipoid disorders: Secondary | ICD-10-CM

## 2023-08-31 DIAGNOSIS — E669 Obesity, unspecified: Secondary | ICD-10-CM | POA: Diagnosis not present

## 2023-08-31 DIAGNOSIS — G4700 Insomnia, unspecified: Secondary | ICD-10-CM

## 2023-08-31 DIAGNOSIS — Z125 Encounter for screening for malignant neoplasm of prostate: Secondary | ICD-10-CM

## 2023-08-31 MED ORDER — AMLODIPINE BESYLATE 10 MG PO TABS
10.0000 mg | ORAL_TABLET | Freq: Every day | ORAL | 3 refills | Status: DC
  Filled 2023-08-31: qty 90, 90d supply, fill #0
  Filled 2023-12-03: qty 90, 90d supply, fill #1
  Filled 2024-03-03: qty 90, 90d supply, fill #2
  Filled 2024-06-19: qty 90, 90d supply, fill #3

## 2023-08-31 NOTE — Assessment & Plan Note (Signed)
Chronic and stable.  Recommend weight reduction, exercise and support stockings

## 2023-08-31 NOTE — Assessment & Plan Note (Signed)
Wt Readings from Last 3 Encounters:  08/31/23 243 lb (110.2 kg)  01/04/23 240 lb 3.2 oz (109 kg)  12/08/22 250 lb (113.4 kg)   Stable.  Recommend exercise and sugar reduction.   Which has helped in the past

## 2023-08-31 NOTE — Assessment & Plan Note (Signed)
At goal today.  Recommend monitor at home .  Continue current medications.  Check labs

## 2023-08-31 NOTE — Assessment & Plan Note (Signed)
Recurrent worsened by stress.  See after visit summary for recommendations

## 2023-08-31 NOTE — Patient Instructions (Addendum)
Good to see you today - Thank you for coming in  Things we discussed today:  Get your colonoscopy  Sleep - Walk for relaxation daily - If can't get to sleep in 15 mintues get up and do something boring or write for 15-20 minutes - Get up and go to sleep same time  - Melatonin is ok but not benadryl containing sleep aids - If stress is not improving let me know or call EACP  Hypertension Your goal blood pressure is less than 140/90.  Check your blood pressure several times a week.  If regularly higher than this please let me know - either with MyChart or leaving a phone message. Next visit please bring in your blood pressure cuff.     Weight - Slowly cut out sweet drinks   Legs Wear your compression stockings   I will call you if your tests are not good.  Otherwise, I will send you a message on MyChart (if it is active) or a letter in the mail..  If you do not hear from me with in 2 weeks please call our office.     Please always bring your medication bottles  If you blood pressure is good come back in 6 months if not come back in one month

## 2023-08-31 NOTE — Progress Notes (Signed)
SUBJECTIVE:   CHIEF COMPLAINT / HPI:   Hypertension Knows his medications and taking regularly.  Has cuff at home but doesn't use  R leg swelling - worse if he doesn't wear compression hose.  Other wise stable  Sleep Problems getting to sleep.  Mind races.  Feels related to stress.     Weight Drinks sweet tea and lemonade.  Tries to cut back on portion   OBJECTIVE:   BP 130/88   Pulse 86   Wt 243 lb (110.2 kg)   SpO2 97%   BMI 33.89 kg/m   Heart - Regular rate and rhythm.  No murmurs, gallops or rubs.    Lungs:  Normal respiratory effort, chest expands symmetrically. Lungs are clear to auscultation, no crackles or wheezes. Abdomen: soft with mld focal tenderness in LLQ. without masses, organomegaly or hernias noted.  No guarding or rebound Extremities:  No cyanosis, mild edema,at sock line bilaterally R>L or deformity noted with good range of motion of all major joints.   Mobility:able to get up and down from exam table without assistance or distress   ASSESSMENT/PLAN:   Annual Examination Male over 21 yo  I reviewed the following patient responses on our Physical Exam Form Tobacco use and candidacy for lung cancer or aneurysm screening Alcohol Use  Weight  Exercise  Risk for STI  Fall risk Advanced Directive Increased family cancer risk Violence risk  PHQ9 score reviewed  Blood pressure reviewed  I considered the following items based upon USPSTF recommendations: Colon cancer screening Prostate Cancer if male at birth HIV testing:  Hepatitis C testing Cholesterol screening STI screening if high risk (Hepatitis B, Syphilis, Gonorrhea, Chlamydia) Immunizations - Influenza, Covid, [place], Pneumonia, Tetanus  See After Visit Summary for recommendations   Screening cholesterol level -     Lipid panel  Primary hypertension Assessment & Plan: At goal today.  Recommend monitor at home .  Continue current medications.  Check labs   Orders: -      Basic metabolic panel  Screening for malignant neoplasm of prostate -     PSA  Need for pneumococcal 20-valent conjugate vaccination -     Pneumococcal conjugate vaccine 20-valent  Right leg swelling Assessment & Plan: Chronic and stable.  Recommend weight reduction, exercise and support stockings    Insomnia, unspecified type Assessment & Plan: Recurrent worsened by stress.  See after visit summary for recommendations    Obesity (BMI 30-39.9) Assessment & Plan: Wt Readings from Last 3 Encounters:  08/31/23 243 lb (110.2 kg)  01/04/23 240 lb 3.2 oz (109 kg)  12/08/22 250 lb (113.4 kg)   Stable.  Recommend exercise and sugar reduction.   Which has helped in the past    Other orders -     amLODIPine Besylate; Take 1 tablet (10 mg total) by mouth at bedtime.  Dispense: 90 tablet; Refill: 3     Patient Instructions  Good to see you today - Thank you for coming in  Things we discussed today:  Get your colonoscopy  Sleep - Walk for relaxation daily - If can't get to sleep in 15 mintues get up and do something boring or write for 15-20 minutes - Get up and go to sleep same time  - Melatonin is ok but not benadryl containing sleep aids - If stress is not improving let me know or call EACP  Hypertension Your goal blood pressure is less than 140/90.  Check your blood pressure several times a  week.  If regularly higher than this please let me know - either with MyChart or leaving a phone message. Next visit please bring in your blood pressure cuff.     Weight - Slowly cut out sweet drinks   Legs Wear your compression stockings   I will call you if your tests are not good.  Otherwise, I will send you a message on MyChart (if it is active) or a letter in the mail..  If you do not hear from me with in 2 weeks please call our office.     Please always bring your medication bottles  If you blood pressure is good come back in 6 months if not come back in one month       Carney Living, MD [facility]

## 2023-09-01 LAB — BASIC METABOLIC PANEL
BUN/Creatinine Ratio: 17 (ref 10–24)
BUN: 15 mg/dL (ref 8–27)
CO2: 24 mmol/L (ref 20–29)
Calcium: 9.9 mg/dL (ref 8.6–10.2)
Chloride: 101 mmol/L (ref 96–106)
Creatinine, Ser: 0.9 mg/dL (ref 0.76–1.27)
Glucose: 101 mg/dL — ABNORMAL HIGH (ref 70–99)
Potassium: 3.9 mmol/L (ref 3.5–5.2)
Sodium: 141 mmol/L (ref 134–144)
eGFR: 94 mL/min/{1.73_m2} (ref >59)

## 2023-09-01 LAB — LIPID PANEL
Chol/HDL Ratio: 4.7 ratio (ref 0.0–5.0)
Cholesterol, Total: 128 mg/dL (ref 100–199)
HDL: 27 mg/dL — ABNORMAL LOW (ref >39)
LDL Chol Calc (NIH): 61 mg/dL (ref 0–99)
Triglycerides: 247 mg/dL — ABNORMAL HIGH (ref 0–149)
VLDL Cholesterol Cal: 40 mg/dL (ref 5–40)

## 2023-09-01 LAB — PSA: Prostate Specific Ag, Serum: 1.2 ng/mL (ref 0.0–4.0)

## 2023-09-27 ENCOUNTER — Ambulatory Visit: Payer: Commercial Managed Care - PPO | Admitting: Family Medicine

## 2023-09-27 VITALS — BP 138/88 | Ht 71.0 in | Wt 245.0 lb

## 2023-09-27 DIAGNOSIS — M7661 Achilles tendinitis, right leg: Secondary | ICD-10-CM | POA: Diagnosis not present

## 2023-09-27 NOTE — Progress Notes (Signed)
PCP: Carney Living, MD  Subjective:   HPI: Patient is a 67 y.o. male here for right heel pain.  Patient here for a few weeks of right posterior heel pain No acute injury or trauma. Pain worse when using stairs or on hills. No acute injury or trauma. No prior issues with this. No plantar pain.  Past Medical History:  Diagnosis Date   GERD (gastroesophageal reflux disease)    Hyperlipidemia    Hypertension    Varicose veins     Current Outpatient Medications on File Prior to Visit  Medication Sig Dispense Refill   amLODipine (NORVASC) 10 MG tablet Take 1 tablet (10 mg total) by mouth at bedtime. 90 tablet 3   cetirizine (ZYRTEC ALLERGY) 10 MG tablet Take 1 tablet (10 mg total) by mouth at bedtime. 90 tablet 1   hydrochlorothiazide (HYDRODIURIL) 25 MG tablet Take 1 tablet (25 mg total) by mouth daily. 90 tablet 3   pantoprazole (PROTONIX) 20 MG tablet Take 1 tablet (20 mg total) by mouth daily. 90 tablet 2   sildenafil (REVATIO) 20 MG tablet Take 1-5 tablets (20-100 mg total) by mouth daily as needed for erectile dysfunction (Patient not taking: Reported on 08/11/2022) 90 tablet 3   No current facility-administered medications on file prior to visit.    Past Surgical History:  Procedure Laterality Date   COLONOSCOPY  2010   ESOPHAGOGASTRIC FUNDOPLICATION  1997   EXTRACORPOREAL SHOCK WAVE LITHOTRIPSY Left 11/06/2021   Procedure: LEFT EXTRACORPOREAL SHOCK WAVE LITHOTRIPSY (ESWL);  Surgeon: Rene Paci, MD;  [facility];  Service: Urology;  Laterality: Left;   HERNIA REPAIR     MANDIBLE SURGERY      No Known Allergies  BP 138/88   Ht 5\' 11"  (1.803 m)   Wt 245 lb (111.1 kg)   BMI 34.17 kg/m       No data to display              No data to display              Objective:  Physical Exam:  Gen: NAD, comfortable in exam room  Right foot/ankle: Haglund's but nontender.  No other gross deformity, swelling,  ecchymoses FROM TTP achilles about 3 cm proximal to insertion on calcneus Negative ant drawer and negative talar tilt.   Negative calcaneal squeeze. Thompsons test negative. NV intact distally.   Assessment & Plan:  1. Right achilles tendinopathy - noninsertional.  Heel lifts, home exercise program reviewed.  Icing.  Avoid uneven ground and hills.  Voltaren gel, tylenol.  F/u in 4 weeks.  Consider physical therapy, orthotics, nitroglycerin patches if not improving.

## 2023-09-27 NOTE — Patient Instructions (Signed)
You have Achilles Tendinopathy Voltaren gel up to 4 times a day as needed. Do home exercises daily. Icing 15 minutes at a time as needed. Avoid uneven ground, hills as much as possible. Heel lifts in shoes or shoes with a natural heel lift to take tension off the achilles during the day. Consider physical therapy, orthotics, nitro patches if not improving as expected. Follow up in 4 weeks.

## 2023-12-03 ENCOUNTER — Other Ambulatory Visit: Payer: Self-pay | Admitting: Family Medicine

## 2023-12-03 ENCOUNTER — Other Ambulatory Visit: Payer: Self-pay

## 2023-12-03 ENCOUNTER — Other Ambulatory Visit (HOSPITAL_COMMUNITY): Payer: Self-pay

## 2023-12-03 MED ORDER — HYDROCHLOROTHIAZIDE 25 MG PO TABS
25.0000 mg | ORAL_TABLET | Freq: Every day | ORAL | 3 refills | Status: DC
  Filled 2023-12-03: qty 90, 90d supply, fill #0
  Filled 2024-03-03: qty 90, 90d supply, fill #1

## 2023-12-03 MED ORDER — SILDENAFIL CITRATE 20 MG PO TABS
20.0000 mg | ORAL_TABLET | Freq: Every day | ORAL | 3 refills | Status: DC | PRN
  Filled 2023-12-03: qty 90, 18d supply, fill #0

## 2024-02-09 ENCOUNTER — Emergency Department (HOSPITAL_COMMUNITY)
Admission: EM | Admit: 2024-02-09 | Discharge: 2024-02-09 | Disposition: A | Discharge status: Home / Self Care | Payer: Commercial Managed Care - PPO | Attending: Emergency Medicine | Admitting: Emergency Medicine

## 2024-02-09 ENCOUNTER — Other Ambulatory Visit: Payer: Self-pay

## 2024-02-09 ENCOUNTER — Emergency Department (HOSPITAL_COMMUNITY): Payer: Commercial Managed Care - PPO

## 2024-02-09 DIAGNOSIS — R0989 Other specified symptoms and signs involving the circulatory and respiratory systems: Secondary | ICD-10-CM | POA: Diagnosis not present

## 2024-02-09 DIAGNOSIS — I10 Essential (primary) hypertension: Secondary | ICD-10-CM | POA: Insufficient documentation

## 2024-02-09 DIAGNOSIS — R0781 Pleurodynia: Secondary | ICD-10-CM | POA: Diagnosis present

## 2024-02-09 DIAGNOSIS — M940 Chondrocostal junction syndrome [Tietze]: Secondary | ICD-10-CM | POA: Insufficient documentation

## 2024-02-09 DIAGNOSIS — R109 Unspecified abdominal pain: Secondary | ICD-10-CM | POA: Diagnosis not present

## 2024-02-09 DIAGNOSIS — Z79899 Other long term (current) drug therapy: Secondary | ICD-10-CM | POA: Insufficient documentation

## 2024-02-09 DIAGNOSIS — R918 Other nonspecific abnormal finding of lung field: Secondary | ICD-10-CM | POA: Diagnosis not present

## 2024-02-09 LAB — COMPREHENSIVE METABOLIC PANEL
ALT: 35 U/L (ref 0–44)
AST: 32 U/L (ref 15–41)
Albumin: 4.2 g/dL (ref 3.5–5.0)
Alkaline Phosphatase: 71 U/L (ref 38–126)
Anion gap: 12 (ref 5–15)
BUN: 15 mg/dL (ref 8–23)
CO2: 26 mmol/L (ref 22–32)
Calcium: 9.5 mg/dL (ref 8.9–10.3)
Chloride: 100 mmol/L (ref 98–111)
Creatinine, Ser: 0.75 mg/dL (ref 0.61–1.24)
GFR, Estimated: >60 mL/min (ref >60)
Glucose, Bld: 89 mg/dL (ref 70–99)
Potassium: 3.6 mmol/L (ref 3.5–5.1)
Sodium: 138 mmol/L (ref 135–145)
Total Bilirubin: 1.7 mg/dL — ABNORMAL HIGH (ref 0.0–1.2)
Total Protein: 7.6 g/dL (ref 6.5–8.1)

## 2024-02-09 LAB — URINALYSIS, ROUTINE W REFLEX MICROSCOPIC
Bilirubin Urine: NEGATIVE
Glucose, UA: NEGATIVE mg/dL
Hgb urine dipstick: NEGATIVE
Ketones, ur: NEGATIVE mg/dL
Leukocytes,Ua: NEGATIVE
Nitrite: NEGATIVE
Protein, ur: NEGATIVE mg/dL
Specific Gravity, Urine: 1.02 (ref 1.005–1.030)
pH: 5 (ref 5.0–8.0)

## 2024-02-09 LAB — CBC
HCT: 50.1 % (ref 39.0–52.0)
Hemoglobin: 17.4 g/dL — ABNORMAL HIGH (ref 13.0–17.0)
MCH: 29.4 pg (ref 26.0–34.0)
MCHC: 34.7 g/dL (ref 30.0–36.0)
MCV: 84.6 fL (ref 80.0–100.0)
Platelets: 190 10*3/uL (ref 150–400)
RBC: 5.92 MIL/uL — ABNORMAL HIGH (ref 4.22–5.81)
RDW: 13.7 % (ref 11.5–15.5)
WBC: 7.7 10*3/uL (ref 4.0–10.5)
nRBC: 0 % (ref 0.0–0.2)

## 2024-02-09 LAB — LIPASE, BLOOD: Lipase: 35 U/L (ref 11–51)

## 2024-02-09 MED ORDER — KETOROLAC TROMETHAMINE 15 MG/ML IJ SOLN
15.0000 mg | Freq: Once | INTRAMUSCULAR | Status: AC
  Administered 2024-02-09: 15 mg via INTRAVENOUS
  Filled 2024-02-09: qty 1

## 2024-02-09 NOTE — ED Provider Notes (Signed)
 [place] EMERGENCY DEPARTMENT AT [facility] Provider Note   CSN: [id number] Arrival date & time: 02/09/24  4098     History  Chief Complaint  Patient presents with   right side pain    [name] [name] is a 68 y.o. male.  With history of kidney stones and hypertension presents to the ED for right side pain.  Pain over right lateral lower ribs since the last 2 days.  Worsen with coughing or yawning or rolling over onto that side in bed.  Feels different from past episodes of kidney stones.  No urinary symptoms.  No productive coughing fevers chills night sweats nausea vomiting bowel habits  HPI     Home Medications Prior to Admission medications   Medication Sig Start Date End Date Taking? Authorizing Provider  amLODipine (NORVASC) 10 MG tablet Take 1 tablet (10 mg total) by mouth at bedtime. 08/31/23   Carney Living, MD  cetirizine (ZYRTEC ALLERGY) 10 MG tablet Take 1 tablet (10 mg total) by mouth at bedtime. 11/08/22 05/07/23  Theadora Rama Scales, PA-C  hydrochlorothiazide (HYDRODIURIL) 25 MG tablet Take 1 tablet (25 mg total) by mouth daily. 12/03/23   Carney Living, MD  pantoprazole (PROTONIX) 20 MG tablet Take 1 tablet (20 mg total) by mouth daily. 06/14/23   Carney Living, MD  sildenafil (REVATIO) 20 MG tablet Take 1-5 tablets (20-100 mg total) by mouth daily as needed for erectile dysfunction. 12/03/23         Allergies    Patient has no known allergies.    Review of Systems   Review of Systems  Physical Exam Updated Vital Signs BP 131/87 (BP Location: Right Arm)   Pulse 73   Temp 97.7 F (36.5 C) (Oral)   Resp 18   Ht 5\' 11"  (1.803 m)   Wt 106.6 kg   SpO2 97%   BMI 32.78 kg/m  Physical Exam Vitals and nursing note reviewed.  HENT:     Head: Normocephalic and atraumatic.  Eyes:     Pupils: Pupils are equal, round, and reactive to light.  Cardiovascular:     Rate and Rhythm: Normal rate and regular rhythm.  Pulmonary:      Effort: Pulmonary effort is normal.     Breath sounds: Normal breath sounds.  Abdominal:     Palpations: Abdomen is soft.     Tenderness: There is no abdominal tenderness.  Musculoskeletal:     Comments: Right lateral chest wall tenderness to palpation over ribs 10 and 11 with no overlying ecchymosis erythema or deformity  Skin:    General: Skin is warm and dry.  Neurological:     Mental Status: He is alert.  Psychiatric:        Mood and Affect: Mood normal.     ED Results / Procedures / Treatments   Labs (all labs ordered are listed, but only abnormal results are displayed) Labs Reviewed  CBC - Abnormal; Notable for the following components:      Result Value   RBC 5.92 (*)    Hemoglobin 17.4 (*)    All other components within normal limits  COMPREHENSIVE METABOLIC PANEL - Abnormal; Notable for the following components:   Total Bilirubin 1.7 (*)    All other components within normal limits  LIPASE, BLOOD  URINALYSIS, ROUTINE W REFLEX MICROSCOPIC    EKG None  Radiology DG Chest 2 View Result Date: 02/09/2024 CLINICAL DATA:  [id number]. Right-sided abdominal pain and shortness of breath.  EXAM: CHEST - 2 VIEW COMPARISON:  PA Lat chest 03/02/2022 FINDINGS: The heart size and mediastinal contours are within normal limits. There are increased streaky opacities in left lower lung field on the PA view which could be due to atelectasis or atelectasis intermixed with pneumonia. There are low lung volumes. The remaining lungs are generally clear. No pleural effusion. The visualized skeletal structures are intact, with spondylosis and multilevel thoracic spine bridging enthesopathy. IMPRESSION: Increased streaky opacities in the left lower lung field on the PA view which could be due to atelectasis or atelectasis intermixed with pneumonia. Follow-up chest radiograph recommended. Electronically Signed   By: Almira Bar M.D.   On: 02/09/2024 07:38    Procedures Procedures     Medications Ordered in ED Medications  ketorolac (TORADOL) 15 MG/ML injection 15 mg (15 mg Intravenous Given 02/09/24 1231)    ED Course/ Medical Decision Making/ A&P Clinical Course as of 02/09/24 1321  Wed Feb 09, 2024  1318 No UTI or hematuria on UA.  Metabolic panel CBC unremarkable.  No elevation in LFTs or lipase.  Chest x-ray shows no corresponding abnormality in the right lung fields.  There was a question of an infiltrate on the left but he has not had productive coughing fevers and has no white count.  Low suspicion for pneumonia.  Given reproducible right rib pain discomfort suspect this is musculoskeletal in etiology.  Instructed him on symptomatic management and he will follow-up with his PCP [MP]    Clinical Course User Index [MP] Royanne Foots, DO                                 Medical Decision Making 68 year old male with history as above presenting for 2 days of right-sided pain.  Worse with coughing and deep palpation.  No urinary symptoms.  Clear lungs on auscultation.  No fevers chills or productive coughing.  Differential diagnosis includes pneumonia, viral respiratory infection, kidney stone, cholecystitis, costochondritis.  Will obtain laboratory workup and chest x-ray along with urinalysis and reassess.  Will provide Toradol for comfort  Amount and/or Complexity of Data Reviewed Radiology: ordered.  Risk Prescription drug management.           Final Clinical Impression(s) / ED Diagnoses Final diagnoses:  Costochondritis    Rx / DC Orders ED Discharge Orders     None         Royanne Foots, DO 02/09/24 1321

## 2024-02-09 NOTE — ED Notes (Signed)
Pt verbalized understanding of discharge instructions. Pt ambulatory at time of discharge. IV removed.

## 2024-02-09 NOTE — ED Triage Notes (Signed)
 Pt. Stated, [name] [name] had upper right side  pain underneath right arm since Sunday. It doesn't hurt to breath but if I yawn or cough I have to cut it short cause it hurts so bad.

## 2024-02-09 NOTE — Discharge Instructions (Addendum)
 You were seen in the emergency department for right side pain Your blood work chest x-ray and urine test all looked okay The most likely cause of your symptoms would be a strained muscle in the right chest wall probably from coughing Take Tylenol Motrin as directed for pain and discomfort Return to the emergency department for severe pain, trouble breathing or any other concerns Otherwise please follow-up with your primary care doctor in 1 week for reevaluation

## 2024-03-03 ENCOUNTER — Other Ambulatory Visit: Payer: Self-pay | Admitting: Family Medicine

## 2024-03-05 MED ORDER — PANTOPRAZOLE SODIUM 20 MG PO TBEC
20.0000 mg | DELAYED_RELEASE_TABLET | Freq: Every day | ORAL | 2 refills | Status: DC
  Filled 2024-03-05: qty 90, 90d supply, fill #0
  Filled 2024-06-19: qty 90, 90d supply, fill #1
  Filled 2024-09-12: qty 90, 90d supply, fill #2

## 2024-03-06 ENCOUNTER — Other Ambulatory Visit (HOSPITAL_COMMUNITY): Payer: Self-pay

## 2024-04-16 ENCOUNTER — Other Ambulatory Visit: Payer: Self-pay

## 2024-04-16 ENCOUNTER — Ambulatory Visit
Admission: EM | Admit: 2024-04-16 | Discharge: 2024-04-16 | Disposition: A | Discharge status: Home / Self Care | Attending: Family Medicine | Admitting: Family Medicine

## 2024-04-16 DIAGNOSIS — T148XXA Other injury of unspecified body region, initial encounter: Secondary | ICD-10-CM

## 2024-04-16 MED ORDER — METHYLPREDNISOLONE 4 MG PO TBPK
ORAL_TABLET | ORAL | 0 refills | Status: DC
Start: 2024-04-16 — End: 2024-04-25

## 2024-04-16 MED ORDER — TIZANIDINE HCL 4 MG PO TABS: 4.0000 mg | ORAL_TABLET | Freq: Four times a day (QID) | ORAL | 0 refills | Status: DC | PRN

## 2024-04-16 NOTE — ED Triage Notes (Signed)
 Mid right back pain x 2 weeks. No known injury. Has been taking tylenol .

## 2024-04-16 NOTE — Discharge Instructions (Signed)
 May use ice or heat to area Take the Medrol  Dosepak as directed Take tizanidine as a muscle relaxer.  Take 2 at bedtime.  You may take during the day but it can cause drowsiness, do not take at work See your doctor if not improving by next week I have given you a note to limit lifting at work

## 2024-04-25 ENCOUNTER — Ambulatory Visit (INDEPENDENT_AMBULATORY_CARE_PROVIDER_SITE_OTHER): Admitting: Student

## 2024-04-25 ENCOUNTER — Other Ambulatory Visit (HOSPITAL_COMMUNITY): Payer: Self-pay

## 2024-04-25 ENCOUNTER — Encounter: Payer: Self-pay | Admitting: Student

## 2024-04-25 VITALS — BP 150/96 | HR 84 | Ht 71.0 in | Wt 240.4 lb

## 2024-04-25 DIAGNOSIS — I10 Essential (primary) hypertension: Secondary | ICD-10-CM

## 2024-04-25 MED ORDER — HYDROCHLOROTHIAZIDE 50 MG PO TABS
50.0000 mg | ORAL_TABLET | Freq: Every day | ORAL | 3 refills | Status: DC
  Filled 2024-04-25: qty 90, 90d supply, fill #0

## 2024-04-25 NOTE — Assessment & Plan Note (Signed)
 Elevated x2 Increase hydrochlorothiazide  to 50 mg daily, although worried this will not achieve goal Continue Amlodipine  10 mg daily Would consider combo pill such as amlodipine - ARB at next visit if still not at goal Discussed ER and return precuations

## 2024-04-25 NOTE — Patient Instructions (Signed)
 It was great seeing you today.  As we discussed, - Blood pressure was still a bit higher than I'd like. Increase hydrochlorothiazide  to 50 mg daily. You may take 2 tablets daily of your current supply  until you run out. Then, take 1 tablet of hydrochlorothiazide  50 mg daily that I have sent to your pharmacy - Continue Amlodipine  at current dose - Come back and see us  in 2 weeks for a re-check of your blood pressure  If you have any questions or concerns, please feel free to call the clinic.   Have a wonderful day,  Dr. Vallorie Gayer [facility] [phone]

## 2024-04-25 NOTE — Progress Notes (Signed)
    SUBJECTIVE:   CHIEF COMPLAINT / HPI:   Hypertension Last Sunday, 7 days ago went to the urgent care in [place] for muscle sprain, and he was treated with prednisone  and muscle relaxer.  Since taking the prednisone , he had headache and he checked his blood pressure was 170/117. He stopped taking the prednisone  and had improvement in his blood pressure. Taking Amlodipine  10 mg daily and hydrochlorothiazide  25 mg daily.  Denies chest pain, shortness of breath. Still having some muscular pain in his back.   OBJECTIVE:   BP (!) 150/96   Pulse 84   Ht 5\' 11"  (1.803 m)   Wt 240 lb 6.4 oz (109 kg)   SpO2 98%   BMI 33.53 kg/m   General: NAD, well appearing Cardiac: RRR Neuro: A&O Respiratory: normal WOB on RA. No wheezing or crackles on auscultation, good lung sounds throughout Extremities: Moving all 4 extremities equally    ASSESSMENT/PLAN:   HTN (hypertension) Elevated x2 Increase hydrochlorothiazide  to 50 mg daily, although worried this will not achieve goal Continue Amlodipine  10 mg daily Would consider combo pill such as amlodipine - ARB at next visit if still not at goal Discussed ER and return precuations    Vallorie Gayer, DO [facility]

## 2024-05-12 ENCOUNTER — Ambulatory Visit: Admitting: Student

## 2024-05-23 ENCOUNTER — Ambulatory Visit (INDEPENDENT_AMBULATORY_CARE_PROVIDER_SITE_OTHER): Admitting: Family Medicine

## 2024-05-23 ENCOUNTER — Encounter: Payer: Self-pay | Admitting: Family Medicine

## 2024-05-23 ENCOUNTER — Other Ambulatory Visit (HOSPITAL_COMMUNITY): Payer: Self-pay

## 2024-05-23 VITALS — BP 128/76 | HR 85 | Wt 243.0 lb

## 2024-05-23 DIAGNOSIS — M546 Pain in thoracic spine: Secondary | ICD-10-CM | POA: Diagnosis not present

## 2024-05-23 DIAGNOSIS — I10 Essential (primary) hypertension: Secondary | ICD-10-CM | POA: Diagnosis not present

## 2024-05-23 MED ORDER — OLMESARTAN MEDOXOMIL-HCTZ 20-12.5 MG PO TABS
1.0000 | ORAL_TABLET | Freq: Every day | ORAL | 2 refills | Status: DC
  Filled 2024-05-23: qty 30, 30d supply, fill #0
  Filled 2024-06-13 – 2024-06-16 (×2): qty 30, 30d supply, fill #1
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
## 2024-05-23 NOTE — Patient Instructions (Signed)
 It was wonderful to see you today.  Please bring ALL of your medications with you to every visit.   Today we talked about:   - STOP your hydrochlorothiazide  50 mg  - START Benicar  - CALL ME or message if too expensive  - An x-ray was ordered for you---you do not need an appointment to have this completed.  I recommend going to [facility] [address] [place]  If the results are normal,I will send you a letter  I will call you with results if anything is abnormal     Please follow up in 2 weeks    Thank you for choosing [facility].   Please call [phone] with any questions about today's appointment.  Please be sure to schedule follow up at the front  desk before you leave today.   Otho Blitz, MD  Family Medicine

## 2024-05-23 NOTE — Assessment & Plan Note (Signed)
 Will adjust regiment to olmesartan 20/HCTZ 12.5.  BMP today.  Stop hydrochlorothiazide  50 mg.  Continue amlodipine  10 mg

## 2024-05-24 ENCOUNTER — Other Ambulatory Visit: Payer: Self-pay | Admitting: Family Medicine

## 2024-05-24 ENCOUNTER — Ambulatory Visit
Admission: RE | Admit: 2024-05-24 | Discharge: 2024-05-24 | Disposition: A | Discharge status: Home / Self Care | Source: Ambulatory Visit | Attending: Family Medicine | Admitting: Family Medicine

## 2024-05-24 ENCOUNTER — Ambulatory Visit: Payer: Self-pay | Admitting: Family Medicine

## 2024-05-24 DIAGNOSIS — I10 Essential (primary) hypertension: Secondary | ICD-10-CM

## 2024-05-24 DIAGNOSIS — M546 Pain in thoracic spine: Secondary | ICD-10-CM

## 2024-05-24 DIAGNOSIS — M545 Low back pain, unspecified: Secondary | ICD-10-CM | POA: Diagnosis not present

## 2024-05-24 LAB — BASIC METABOLIC PANEL WITH GFR
BUN/Creatinine Ratio: 16 (ref 10–24)
BUN: 18 mg/dL (ref 8–27)
CO2: 23 mmol/L (ref 20–29)
Calcium: 10 mg/dL (ref 8.6–10.2)
Chloride: 100 mmol/L (ref 96–106)
Creatinine, Ser: 1.11 mg/dL (ref 0.76–1.27)
Glucose: 90 mg/dL (ref 70–99)
Potassium: 3.9 mmol/L (ref 3.5–5.2)
Sodium: 142 mmol/L (ref 134–144)
eGFR: 73 mL/min/{1.73_m2} (ref >59)

## 2024-05-26 ENCOUNTER — Other Ambulatory Visit (HOSPITAL_COMMUNITY): Payer: Self-pay

## 2024-05-26 ENCOUNTER — Ambulatory Visit: Payer: Self-pay | Admitting: Family Medicine

## 2024-05-26 ENCOUNTER — Telehealth: Payer: Self-pay | Admitting: Family Medicine

## 2024-05-26 MED ORDER — TIZANIDINE HCL 4 MG PO TABS
4.0000 mg | ORAL_TABLET | Freq: Four times a day (QID) | ORAL | 0 refills | Status: DC | PRN
  Filled 2024-05-26: qty 30, 8d supply, fill #0

## 2024-05-26 NOTE — Telephone Encounter (Signed)
 Patient came in. Requested refill on Zanaflex . This is sent. No change in symptoms but not improving. Discussed PT. Called Radiology to read xrays.  Otho Blitz, MD  Family Medicine Teaching Service

## 2024-05-30 ENCOUNTER — Encounter: Payer: Self-pay | Admitting: *Deleted

## 2024-06-02 ENCOUNTER — Ambulatory Visit (INDEPENDENT_AMBULATORY_CARE_PROVIDER_SITE_OTHER): Admitting: Student

## 2024-06-02 ENCOUNTER — Encounter: Payer: Self-pay | Admitting: Student

## 2024-06-02 VITALS — BP 118/72 | HR 77 | Ht 71.0 in | Wt 244.8 lb

## 2024-06-02 DIAGNOSIS — I10 Essential (primary) hypertension: Secondary | ICD-10-CM

## 2024-06-02 NOTE — Patient Instructions (Signed)
 It was great seeing you today.  As we discussed, - Continue your medications, no changes - Return for check up in 3 months   If you have any questions or concerns, please feel free to call the clinic.   Have a wonderful day,  Dr. Vallorie Gayer [facility] [phone]

## 2024-06-02 NOTE — Progress Notes (Signed)
    SUBJECTIVE:   CHIEF COMPLAINT / HPI:   Hypertension follow-up -Regimen changed to Olmesartan  20 mg/hydrochlorothiazide  12.5 mg daily, hydrochlorothiazide  50 mg discontinued, and continued amlodipine  10 mg daily -Tolerating well - No chest pain, no headaches   PERTINENT  PMH / PSH: HTN,   OBJECTIVE:   BP 118/72   Pulse 77   Ht 5' 11 (1.803 m)   Wt 244 lb 12.8 oz (111 kg)   SpO2 97%   BMI 34.14 kg/m   General: NAD, well appearing Cardiac: RRR Neuro: A&O Respiratory: normal WOB on RA. No wheezing or crackles on auscultation, good lung sounds throughout Extremities: Moving all 4 extremities equally  ASSESSMENT/PLAN:   HTN (hypertension) Well controlled, at goal < 140/90 No changes to regimen today F/u in 3 months or sooner if needed    Glorianna Gott, DO [facility]

## 2024-06-02 NOTE — Assessment & Plan Note (Signed)
 Well controlled, at goal < 140/90 No changes to regimen today F/u in 3 months or sooner if needed

## 2024-06-13 ENCOUNTER — Other Ambulatory Visit (HOSPITAL_COMMUNITY): Payer: Self-pay

## 2024-06-19 ENCOUNTER — Other Ambulatory Visit (HOSPITAL_COMMUNITY): Payer: Self-pay

## 2024-06-30 ENCOUNTER — Other Ambulatory Visit: Payer: Self-pay | Admitting: Family Medicine

## 2024-06-30 ENCOUNTER — Other Ambulatory Visit (HOSPITAL_COMMUNITY): Payer: Self-pay

## 2024-06-30 MED ORDER — OLMESARTAN MEDOXOMIL-HCTZ 20-12.5 MG PO TABS
1.0000 | ORAL_TABLET | Freq: Every day | ORAL | 3 refills | Status: AC
  Filled 2024-06-30 – 2024-07-10 (×3): qty 90, 90d supply, fill #0
  Filled 2024-10-14: qty 90, 90d supply, fill #1
  Filled 2024-12-09 – 2025-01-02 (×2): qty 90, 90d supply, fill #2

## 2024-07-05 ENCOUNTER — Other Ambulatory Visit (HOSPITAL_COMMUNITY): Payer: Self-pay

## 2024-07-10 ENCOUNTER — Other Ambulatory Visit (HOSPITAL_COMMUNITY): Payer: Self-pay

## 2024-09-04 NOTE — Progress Notes (Unsigned)
    SUBJECTIVE:   Chief compliant/HPI: annual examination  [name] [name] is a 68 y.o. who presents today for an annual exam.  ***update date/time  History tabs reviewed and updated ***.   Review of systems form reviewed and notable for ***.   OBJECTIVE:   There were no vitals taken for this visit.  General: ***-appearing, no acute distress. HEENT: normocephalic, PERRLA, EOM grossly intact, MMM, bilateral TM visualized without erythema or bulging. Cardio: Regular rate, *** rhythm, no murmurs on exam. Pulm: Clear, no wheezing, no crackles. No increased work of breathing. Abdominal: bowel sounds present, soft, non-tender, non-distended. No HSM. Extremities: no peripheral edema. Moves all extremities equally. Neuro: Alert and oriented x3, speech normal in content, no facial asymmetry, strength intact and equal bilaterally in UE and LE, pupils equal and reactive to light.  Psych:  Cognition and judgment appear intact. Alert, communicative, and cooperative with normal attention span and concentration. No apparent delusions, illusions, hallucinations    ASSESSMENT/PLAN:   Assessment & Plan  Annual Examination  See AVS for age appropriate recommendations.  PHQ score ***, reviewed and discussed.  Blood pressure value is *** goal, discussed.   Considered the following screening exams based upon USPSTF recommendations: Diabetes screening: {discussed/ordered:14545} Screening for elevated cholesterol: {discussed/ordered:14545} HIV testing: {discussed/ordered:14545} Hepatitis C: {discussed/ordered:14545} Hepatitis B: {discussed/ordered:14545} Syphilis if at high risk: {discussed/ordered:14545} Reviewed risk factors for latent tuberculosis and {not indicated/requested/declined:14582} Colorectal cancer screening: {crcscreen:23821::discussed, colonoscopy ordered} - last done in 2021 and pt was supposed to return for repeat in 6 months due to poor prep *** Lung cancer screening:  {discussed/declined/written pwqn:80301} See documentation below regarding discussion and indication.  PSA discussed and after engaging in discussion of possible risks, benefits and complications of screening patient elected to ***.   Follow up in 1 year or sooner if indicated.  MyChart Activation: Already signed up   Lauraine Norse, DO [facility]

## 2024-09-05 ENCOUNTER — Ambulatory Visit: Payer: Self-pay | Admitting: Family Medicine

## 2024-09-05 ENCOUNTER — Other Ambulatory Visit (HOSPITAL_COMMUNITY): Payer: Self-pay

## 2024-09-05 ENCOUNTER — Ambulatory Visit (INDEPENDENT_AMBULATORY_CARE_PROVIDER_SITE_OTHER): Admitting: Family Medicine

## 2024-09-05 ENCOUNTER — Encounter: Payer: Self-pay | Admitting: Family Medicine

## 2024-09-05 VITALS — BP 115/68 | HR 83 | Ht 71.0 in | Wt 249.0 lb

## 2024-09-05 DIAGNOSIS — I10 Essential (primary) hypertension: Secondary | ICD-10-CM

## 2024-09-05 DIAGNOSIS — E7800 Pure hypercholesterolemia, unspecified: Secondary | ICD-10-CM | POA: Diagnosis not present

## 2024-09-05 DIAGNOSIS — R058 Other specified cough: Secondary | ICD-10-CM | POA: Diagnosis not present

## 2024-09-05 DIAGNOSIS — Z1211 Encounter for screening for malignant neoplasm of colon: Secondary | ICD-10-CM | POA: Diagnosis not present

## 2024-09-05 DIAGNOSIS — Z13220 Encounter for screening for lipoid disorders: Secondary | ICD-10-CM | POA: Diagnosis not present

## 2024-09-05 DIAGNOSIS — Z0000 Encounter for general adult medical examination without abnormal findings: Secondary | ICD-10-CM | POA: Diagnosis not present

## 2024-09-05 LAB — POCT GLYCOSYLATED HEMOGLOBIN (HGB A1C): Hemoglobin A1C: 5.6 % (ref 4.0–5.6)

## 2024-09-05 MED ORDER — AMLODIPINE BESYLATE 10 MG PO TABS
10.0000 mg | ORAL_TABLET | Freq: Every day | ORAL | 3 refills | Status: DC
  Filled 2024-09-05: qty 90, 90d supply, fill #0
  Filled 2024-12-09: qty 90, 90d supply, fill #1

## 2024-09-05 MED ORDER — FLUTICASONE PROPIONATE 50 MCG/ACT NA SUSP
2.0000 | Freq: Every day | NASAL | 6 refills | Status: AC
  Filled 2024-09-05: qty 16, 30d supply, fill #0

## 2024-09-05 NOTE — Patient Instructions (Signed)
 It was so good to see you today! Thank you for allowing me to take care of you.  Today we discussed the following concerns and plans:  Annual exam: You are very healthy - keep up the good work! I have ordered your colonoscopy - the GI office should call you to schedule.  Cold-air cough Try Flonase  nasal spray 2 sprays in each nostril daily.  If you have any concerns, please call the clinic or schedule an appointment.  It was a pleasure to take care of you today. Be well!  Lauraine Norse, DO [facility], PGY-2  Do you need your medications delivered to your home?   We'll send your prescription to the [facility] for delivery.          Address: [address] [place], [place], [place] [address]          Phone: [phone]  Please call the [facility] to speak with a pharmacist and set up your home medication delivery. If you have any questions, feel free to contact us  -- we're happy to help!  Other [facility] that offer affordable prices on both prescriptions and over-the-counter items, as well as convenient services like vaccinations, are  [facility], at [facility]         Address:  [address], [place], [place] [address]         Phone: [phone]  [facility], located in the [facility]        Address: [address], [place], [place] [address]        Phone: [phone]  [facility], at [facility]       Address: [address], [place], [place] [address]       Phone: [phone]  [facility], at [facility]       Address: [address], [place], [place] [address]       Phone: [phone]

## 2024-09-06 LAB — LIPID PANEL
Chol/HDL Ratio: 5.4 ratio — ABNORMAL HIGH (ref 0.0–5.0)
Cholesterol, Total: 124 mg/dL (ref 100–199)
HDL: 23 mg/dL — ABNORMAL LOW (ref >39)
LDL Chol Calc (NIH): 61 mg/dL (ref 0–99)
Triglycerides: 250 mg/dL — ABNORMAL HIGH (ref 0–149)
VLDL Cholesterol Cal: 40 mg/dL (ref 5–40)

## 2024-09-06 LAB — PSA: Prostate Specific Ag, Serum: 1.2 ng/mL (ref 0.0–4.0)

## 2024-09-14 DIAGNOSIS — M1811 Unilateral primary osteoarthritis of first carpometacarpal joint, right hand: Secondary | ICD-10-CM | POA: Diagnosis not present

## 2024-12-09 ENCOUNTER — Other Ambulatory Visit (HOSPITAL_COMMUNITY): Payer: Self-pay

## 2024-12-09 ENCOUNTER — Other Ambulatory Visit: Payer: Self-pay

## 2024-12-11 ENCOUNTER — Other Ambulatory Visit: Payer: Self-pay

## 2024-12-12 ENCOUNTER — Other Ambulatory Visit (HOSPITAL_COMMUNITY): Payer: Self-pay

## 2024-12-12 ENCOUNTER — Other Ambulatory Visit: Payer: Self-pay

## 2024-12-13 ENCOUNTER — Other Ambulatory Visit (HOSPITAL_COMMUNITY): Payer: Self-pay

## 2024-12-15 ENCOUNTER — Other Ambulatory Visit (HOSPITAL_COMMUNITY): Payer: Self-pay

## 2024-12-29 ENCOUNTER — Other Ambulatory Visit (HOSPITAL_COMMUNITY): Payer: Self-pay

## 2025-01-02 ENCOUNTER — Other Ambulatory Visit: Payer: Self-pay

## 2025-01-02 ENCOUNTER — Other Ambulatory Visit (HOSPITAL_COMMUNITY): Payer: Self-pay

## 2025-01-02 ENCOUNTER — Other Ambulatory Visit: Payer: Self-pay | Admitting: Family Medicine

## 2025-01-03 ENCOUNTER — Other Ambulatory Visit (HOSPITAL_COMMUNITY): Payer: Self-pay

## 2025-01-03 MED ORDER — PANTOPRAZOLE SODIUM 20 MG PO TBEC
20.0000 mg | DELAYED_RELEASE_TABLET | Freq: Every day | ORAL | 2 refills | Status: AC
  Filled 2025-01-03: qty 90, 90d supply, fill #0

## 2025-01-05 ENCOUNTER — Emergency Department (HOSPITAL_COMMUNITY)

## 2025-01-05 ENCOUNTER — Emergency Department (HOSPITAL_COMMUNITY)
Admission: EM | Admit: 2025-01-05 | Discharge: 2025-01-05 | Disposition: A | Discharge status: Home / Self Care | Attending: Emergency Medicine | Admitting: Emergency Medicine

## 2025-01-05 ENCOUNTER — Encounter (HOSPITAL_COMMUNITY): Payer: Self-pay

## 2025-01-05 ENCOUNTER — Other Ambulatory Visit: Payer: Self-pay

## 2025-01-05 DIAGNOSIS — R42 Dizziness and giddiness: Secondary | ICD-10-CM | POA: Insufficient documentation

## 2025-01-05 DIAGNOSIS — Z79899 Other long term (current) drug therapy: Secondary | ICD-10-CM | POA: Diagnosis not present

## 2025-01-05 DIAGNOSIS — I10 Essential (primary) hypertension: Secondary | ICD-10-CM | POA: Insufficient documentation

## 2025-01-05 DIAGNOSIS — R531 Weakness: Secondary | ICD-10-CM | POA: Diagnosis not present

## 2025-01-05 LAB — COMPREHENSIVE METABOLIC PANEL WITH GFR
ALT: 33 U/L (ref 0–44)
AST: 30 U/L (ref 15–41)
Albumin: 4.3 g/dL (ref 3.5–5.0)
Alkaline Phosphatase: 86 U/L (ref 38–126)
Anion gap: 10 (ref 5–15)
BUN: 15 mg/dL (ref 8–23)
CO2: 24 mmol/L (ref 22–32)
Calcium: 9.3 mg/dL (ref 8.9–10.3)
Chloride: 100 mmol/L (ref 98–111)
Creatinine, Ser: 0.81 mg/dL (ref 0.61–1.24)
GFR, Estimated: >60 mL/min
Glucose, Bld: 92 mg/dL (ref 70–99)
Potassium: 3.9 mmol/L (ref 3.5–5.1)
Sodium: 134 mmol/L — ABNORMAL LOW (ref 135–145)
Total Bilirubin: 0.7 mg/dL (ref 0.0–1.2)
Total Protein: 7.2 g/dL (ref 6.5–8.1)

## 2025-01-05 LAB — CBC WITH DIFFERENTIAL/PLATELET
Abs Immature Granulocytes: 0.02 K/uL (ref 0.00–0.07)
Basophils Absolute: 0 K/uL (ref 0.0–0.1)
Basophils Relative: 0 %
Eosinophils Absolute: 0.1 K/uL (ref 0.0–0.5)
Eosinophils Relative: 2 %
HCT: 45.8 % (ref 39.0–52.0)
Hemoglobin: 15.7 g/dL (ref 13.0–17.0)
Immature Granulocytes: 0 %
Lymphocytes Relative: 29 %
Lymphs Abs: 1.6 K/uL (ref 0.7–4.0)
MCH: 30 pg (ref 26.0–34.0)
MCHC: 34.3 g/dL (ref 30.0–36.0)
MCV: 87.4 fL (ref 80.0–100.0)
Monocytes Absolute: 0.6 K/uL (ref 0.1–1.0)
Monocytes Relative: 11 %
Neutro Abs: 3.1 K/uL (ref 1.7–7.7)
Neutrophils Relative %: 58 %
Platelets: 192 K/uL (ref 150–400)
RBC: 5.24 MIL/uL (ref 4.22–5.81)
RDW: 13.1 % (ref 11.5–15.5)
WBC: 5.5 K/uL (ref 4.0–10.5)
nRBC: 0 % (ref 0.0–0.2)

## 2025-01-05 LAB — CBG MONITORING, ED: Glucose-Capillary: 99 mg/dL (ref 70–99)

## 2025-01-05 NOTE — Discharge Instructions (Signed)
 Your workup in the emergency department today was reassuring.  We have scheduled you a follow-up visit with your primary care doctor on Tuesday afternoon.  Present to this appointment as scheduled.  Return to the ED for any new or concerning symptoms.

## 2025-01-05 NOTE — ED Provider Notes (Signed)
 "  EMERGENCY DEPARTMENT AT [facility] Provider Note   CSN: [id number] Arrival date & time: 01/05/25  1143     Patient presents with: Dizziness   [name] [name] is a 69 y.o. male.   69 year old male with a history of hypertension, hyperlipidemia, esophageal reflux presents to the emergency department for evaluation of lightheadedness/dizziness.  Patient states that he was coming around the corner from the cafeteria when he felt lightheaded and dizzy, characterized as feeling like he was on a boat.  He had no sensation of the room spinning.  This sensation lasted for a few minutes; he needed to lean against the wall for stability.  He was assisted to a chair.  Denies any associated loss of consciousness.  He is now feeling back to baseline.  Reports associated generalized weakness during this episode.  He did not have any unilateral weakness or unilateral numbness/paresthesias.  No difficulty speaking or swallowing.  Further denies headache, vision changes, nausea, vomiting, chest pain, shortness of breath, recent fever or viral illness, cough or hemoptysis, recent melena or hematochezia, increased bleeding or bruising.  No recent medication changes.  The history is provided by the patient. No language interpreter was used.  Dizziness      Prior to Admission medications  Medication Sig Start Date End Date Taking? Authorizing Provider  amLODipine  (NORVASC ) 10 MG tablet Take 1 tablet (10 mg total) by mouth at bedtime. 09/05/24   Lafe Domino, DO  fluticasone  (FLONASE ) 50 MCG/ACT nasal spray Place 2 sprays into both nostrils daily. 09/05/24   Lafe Domino, DO  olmesartan -hydrochlorothiazide  (BENICAR  HCT) 20-12.5 MG tablet Take 1 tablet by mouth daily. 06/30/24   Delores Suzann HERO, MD  pantoprazole  (PROTONIX ) 20 MG tablet Take 1 tablet (20 mg total) by mouth daily. 01/03/25   Lafe Domino, DO  tiZANidine  (ZANAFLEX ) 4 MG tablet Take 1 tablet (4 mg total) by mouth every 6  (six) hours as needed for muscle spasms. 05/26/24   Delores Suzann HERO, MD    Allergies: Patient has no known allergies.    Review of Systems  Neurological:  Positive for dizziness.  Ten systems reviewed and are negative for acute change, except as noted in the HPI.    Updated Vital Signs BP 121/82 (BP Location: Left Arm)   Pulse 69   Temp (!) 97.5 F (36.4 C)   Resp 18   Ht 5' 11 (1.803 m)   Wt 108.9 kg   SpO2 96%   BMI 33.47 kg/m   Physical Exam Vitals and nursing note reviewed.  Constitutional:      General: He is not in acute distress.    Appearance: He is well-developed. He is not diaphoretic.     Comments: Nontoxic appearing, pleasant obese male.  HENT:     Head: Normocephalic and atraumatic.  Eyes:     General: No scleral icterus.    Conjunctiva/sclera: Conjunctivae normal.  Neck:     Comments: No JVD Cardiovascular:     Rate and Rhythm: Normal rate and regular rhythm.     Pulses: Normal pulses.  Pulmonary:     Effort: Pulmonary effort is normal. No respiratory distress.     Breath sounds: No stridor. No wheezing.     Comments: Lungs CTAB Musculoskeletal:        General: Normal range of motion.     Cervical back: Normal range of motion.  Skin:    General: Skin is warm and dry.     Coloration: Skin  is not pale.     Findings: No erythema or rash.  Neurological:     Mental Status: He is alert and oriented to person, place, and time.  Psychiatric:        Behavior: Behavior normal.     (all labs ordered are listed, but only abnormal results are displayed) Labs Reviewed  COMPREHENSIVE METABOLIC PANEL WITH GFR - Abnormal; Notable for the following components:      Result Value   Sodium 134 (*)    All other components within normal limits  CBC WITH DIFFERENTIAL/PLATELET  CBG MONITORING, ED    EKG: EKG Interpretation Date/Time:  Friday January 05 2025 12:14:01 EST Ventricular Rate:  82 PR Interval:  178 QRS Duration:  136 QT Interval:  412 QTC  Calculation: 481 R Axis:   34  Text Interpretation: Normal sinus rhythm Right bundle branch block Abnormal ECG Confirmed by Garrick Charleston [phone]) on 01/05/2025 12:20:23 PM  Radiology: DG Chest 1 View Result Date: 01/05/2025 EXAM: 1 VIEW(S) XRAY OF THE CHEST 01/05/2025 12:45:00 PM COMPARISON: 02/09/2024. CLINICAL HISTORY: LH (luteinizing hormone) deficiency. FINDINGS: LUNGS AND PLEURA: Hypoinflated lungs. No focal pulmonary opacity. No pleural effusion. No pneumothorax. HEART AND MEDIASTINUM: No acute abnormality of the cardiac and mediastinal silhouettes. BONES AND SOFT TISSUES: No acute osseous abnormality. IMPRESSION: 1. Hypoinflated lungs. No acute process. Electronically signed by: Ryan Chess MD 01/05/2025 01:14 PM EST RP Workstation: [id number]   CT HEAD WO CONTRAST Result Date: 01/05/2025 EXAM: CT HEAD WITHOUT CONTRAST 01/05/2025 12:55:00 PM TECHNIQUE: CT of the head was performed without the administration of intravenous contrast. Automated exposure control, iterative reconstruction, and/or weight based adjustment of the mA/kV was utilized to reduce the radiation dose to as low as reasonably achievable. COMPARISON: None available. CLINICAL HISTORY: Mental status change, unknown cause; near-syncope FINDINGS: BRAIN AND VENTRICLES: No acute hemorrhage. No evidence of acute infarct. No hydrocephalus. No extra-axial collection. No mass effect or midline shift. ORBITS: No acute abnormality. SINUSES: No acute abnormality. SOFT TISSUES AND SKULL: No acute soft tissue abnormality. No skull fracture. IMPRESSION: 1. No acute intracranial abnormality. Electronically signed by: Ryan Chess MD 01/05/2025 01:13 PM EST RP Workstation: [id number]     Procedures   Medications Ordered in the ED - No data to display  Clinical Course as of 01/05/25 1513  Fri Jan 05, 2025  1413 Plan to check orthostatic vital signs. [KH]  1418 Patient feeling back to baseline. Neurologic event considered, but no  aphasia, dysarthria, unilateral weakness or numbness associated with event. Negative head CT.   No evidence of arrhythmia on EKG.  Right bundle branch block appears stable compared to prior.  EKG does not appear c/w Brugada.   No hx of heart failure. Does not have signs of fluid overload today. No JVD. PE considered, but no high risk features or associated SOB. Well's PE score 0.  Overall, event today felt to be low risk. [place] syncope score is 0. Would, however, benefit from further outpatient evaluation given cardiac history, such as echocardiogram, Holter monitor, cardiac CT. This can be pursued further outpatient. [KH]  1432 Discussed case with family practice resident, MD Romelle, who has scheduled patient for outpatient f/u visit on Tuesday. With proceed with d/c if stable on orthostatic evaluation. [KH]    Clinical Course User Index [KH] Keith Sor, PA-C  Medical Decision Making  This patient presents to the ED for concern of lightheadedness, this involves an extensive number of treatment options, and is a complaint that carries with it a high risk of complications and morbidity.  The differential diagnosis includes vasovagal response vs orthostatic hypotension vs arrhythmia vs cardiogenic syncope vs CVA/TIA.   Co morbidities that complicate the patient evaluation  HTN HLD GERD   Additional history obtained:  Additional history obtained from medical records External records from outside source obtained and reviewed including PCP office visit in September 2025.   Lab Tests:  I Ordered, and personally interpreted labs.  The pertinent results include:  NA 134. Labs otherwise normal.   Imaging Studies ordered:  I ordered imaging studies including CT head and CXR  I independently visualized and interpreted imaging which showed no acute pathology I agree with the radiologist interpretation   Cardiac Monitoring:  The patient was  maintained on a cardiac monitor.  I personally viewed and interpreted the cardiac monitored which showed an underlying rhythm of: NSR   Medicines ordered and prescription drug management:  I have reviewed the patients home medicines and have made adjustments as needed   Test Considered:  Troponin   Problem List / ED Course:  As above   Reevaluation:  After the interventions noted above, I reevaluated the patient and found that they have :improved   Social Determinants of Health:  Lives independently   Dispostion:  After consideration of the diagnostic results and the patients response to treatment, I feel that the patent would benefit from close outpatient follow up. Appointment scheduled with PCP for Tuesday. Return precautions discussed and provided. Patient discharged in stable condition with no unaddressed concerns.      Final diagnoses:  Dizziness    ED Discharge Orders     None          Keith Sor, PA-C 01/05/25 1517    Mannie Pac T, DO 01/06/25 0730  "

## 2025-01-05 NOTE — ED Triage Notes (Signed)
 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 Another employee got a chair and assisted him to chair.

## 2025-01-05 NOTE — ED Provider Triage Note (Signed)
 Emergency Medicine Provider Triage Evaluation Note  [name] [name] , a 69 y.o. male  was evaluated in triage.  Pt complains of episode of near syncope with dizziness.  He notes just prior to ED arrival he was walking, felt dizzy suddenly, leaned against the wall did not fall, had no trauma.  There is associated generalized fatigue/weakness, but no focality, no speech difficulty, no facial asymmetry patient now feels better, though not back to normal.  Review of Systems  Positive: Near syncope Negative: Fall  Physical Exam  BP 139/82   Pulse 87   Temp (!) 97.5 F (36.4 C)   Resp 18   SpO2 94%  Gen:   Awake, no distress adult male speaking clearly Resp:  Normal effort no increased work of breathing MSK:   Moves extremities without difficulty no deformity Other:  Neuro grossly intact  Medical Decision Making  Medically screening exam initiated at 12:11 PM.  Appropriate orders placed.  Diago LITTIE Palm was informed that the remainder of the evaluation will be completed by another provider, this initial triage assessment does not replace that evaluation, and the importance of remaining in the ED until their evaluation is complete.   Garrick Charleston, MD 01/05/25 1212

## 2025-01-05 NOTE — ED Notes (Signed)
 Pt stated that he felt okay when standing up. No significant changes in his condition.

## 2025-01-05 NOTE — ED Notes (Signed)
 Pt provided with discharge and follow up instructions, pt verbalized understanding. VSS, pt ambulatory out of ED w/ all paperwork and belongings in NAD.

## 2025-01-08 NOTE — Progress Notes (Unsigned)
" ° ° °  SUBJECTIVE:   CHIEF COMPLAINT / HPI:   ED visit follow-up 01/05/2025 Patient presented to the emergency department last week for new onset dizziness.  Workup at that time included: - CXR and CT head without any acute processes - Labs noncontributory - EKG sinus rhythm  For HTN pt is compliant with prescribed regimen: Benicar  20-12.5 mg daily and amlodipine  10 mg daily. He does not typically miss doses, no side effects.  He tells me today that prior to this event he had just gotten lunch and was walking back to eat it when suddenly he got dizzy and felt he would fall. Like room was spinning. Lasted 90 seconds at most then alleviated spontaneously.  No positional symptoms, this has not happened before or since, no recent falls or head trauma, no nausea/vomiting/abdominal pain, no changes in hearing or vision, no gait instability.   PERTINENT  PMH / PSH: Reviewed. HTN, HLD, GERD  OBJECTIVE:   BP (!) 98/58   Pulse 93   Wt 250 lb (113.4 kg)   SpO2 97%   BMI 34.87 kg/m   General: well-appearing, not diaphoretic, no acute distress. HEENT: normocephalic, PERRLA, EOM grossly intact, MMM Cardio: RRR Pulm: No increased work of breathing. Abdominal: soft, non-tender Extremities: No peripheral edema. Moves all extremities equally. Neuro: Alert and oriented x3, speech normal in content, no facial asymmetry. Psych:  Cognition and judgment appear intact. Alert, communicative, and cooperative with normal attention span and concentration.  ASSESSMENT/PLAN:   Assessment & Plan Dizziness Unclear etiology; consider low blood sugar/dehydration given that this was shortly before a meal, consider cardiac etiology, consider vertigo, consider medication factor. EKG in clinic today NSR without ST elevations or Qtc prolongation; there is significant artifact from suboptimal lead connection 2/2 body hair. -Decrease amlodipine  dose to 5 mg daily; continue olmesartan -hydrochlorothiazide  20-12.5 mg  daily. -Zio patch x 14 days -Urinalysis today, specifically to consider spec grav for dehydration - Continue good hydration - Follow-up in 3 to 4 weeks or sooner as needed    Lauraine Norse, DO [facility] "

## 2025-01-09 ENCOUNTER — Ambulatory Visit: Payer: Self-pay | Admitting: Family Medicine

## 2025-01-09 ENCOUNTER — Other Ambulatory Visit: Payer: Self-pay

## 2025-01-09 ENCOUNTER — Ambulatory Visit: Attending: Family Medicine

## 2025-01-09 ENCOUNTER — Encounter: Payer: Self-pay | Admitting: Family Medicine

## 2025-01-09 VITALS — BP 98/58 | HR 93 | Wt 250.0 lb

## 2025-01-09 DIAGNOSIS — R42 Dizziness and giddiness: Secondary | ICD-10-CM

## 2025-01-09 LAB — POCT UA - MICROSCOPIC ONLY: RBC, Urine, Miroscopic: NONE SEEN

## 2025-01-09 LAB — POCT URINALYSIS DIP (MANUAL ENTRY)
Bilirubin, UA: NEGATIVE
Blood, UA: NEGATIVE
Glucose, UA: NEGATIVE mg/dL
Leukocytes, UA: NEGATIVE
Nitrite, UA: NEGATIVE
Protein Ur, POC: 30 mg/dL — AB
Spec Grav, UA: >=1.03 — AB
Urobilinogen, UA: 0.2 U/dL
pH, UA: 5.5

## 2025-01-09 MED ORDER — AMLODIPINE BESYLATE 10 MG PO TABS: 5.0000 mg | ORAL_TABLET | Freq: Every day | ORAL | Status: AC

## 2025-01-09 NOTE — Patient Instructions (Addendum)
 It was so good to see you today! Thank you for allowing me to take care of you.  Today we discussed the following concerns and plans:  - I want you to decrease your amlodipine  dose to 5 mg daily (it is okay to break your pill in half) - I am prescribing a Zio patch; you will wear this for 14 days. - I will also check some labs today; I will let you know the results. - Continue to stay well-hydrated  Follow-up in 3 to 4 weeks.  If you have any concerns, please call the clinic or schedule an appointment.  It was a pleasure to take care of you today. Be well!  Lauraine Norse, DO [facility], PGY-2  Do you need your medications delivered to your home?   Well send your prescription to the   Pharmacy for delivery.          Address: [address] [place], [place], [place] [address]          Phone: [phone]  Please call the [facility] to speak with a pharmacist and set up your home medication delivery. If you have any questions, feel free to contact us  -- were happy to help!  Other [facility] that offer affordable prices on both prescriptions and over-the-counter items, as well as convenient services like vaccinations, are  [facility], at [facility]         Address:  [address], [place], [place] [address]         Phone: [phone]  [facility], located in the [facility]        Address: [address] [place], [place] [address]        Phone: [phone]  [facility], at [facility]       Address: [address], [place], [place] [address]       Phone: [phone]  [facility], at [facility]       Address: [address], [place], [place] [address]       Phone: [phone]

## 2025-01-09 NOTE — Progress Notes (Unsigned)
 EP to read.
